# Patient Record
Sex: Male | Born: 1960 | Race: Black or African American | Hispanic: No | Marital: Single | Smoking: Current every day smoker
Health system: Southern US, Community
[De-identification: ages and names within clinical notes are randomized; demographics above are authoritative.]

## PROBLEM LIST (undated history)

## (undated) DIAGNOSIS — I10 Essential (primary) hypertension: Secondary | ICD-10-CM

## (undated) DIAGNOSIS — G4700 Insomnia, unspecified: Secondary | ICD-10-CM

## (undated) DIAGNOSIS — F209 Schizophrenia, unspecified: Secondary | ICD-10-CM

## (undated) HISTORY — PX: KNEE SURGERY: SHX244

## (undated) HISTORY — DX: Insomnia, unspecified: G47.00

---

## 2008-07-16 ENCOUNTER — Encounter: Admission: RE | Admit: 2008-07-16 | Discharge: 2008-07-16 | Payer: Self-pay | Admitting: Orthopedic Surgery

## 2008-11-03 ENCOUNTER — Encounter: Admission: RE | Admit: 2008-11-03 | Discharge: 2008-11-16 | Payer: Self-pay | Admitting: Orthopedic Surgery

## 2009-02-11 ENCOUNTER — Encounter: Admission: RE | Admit: 2009-02-11 | Discharge: 2009-02-11 | Payer: Self-pay | Admitting: Orthopedic Surgery

## 2009-03-02 ENCOUNTER — Encounter: Admission: RE | Admit: 2009-03-02 | Discharge: 2009-05-31 | Payer: Self-pay | Admitting: Orthopedic Surgery

## 2010-07-12 ENCOUNTER — Emergency Department (HOSPITAL_COMMUNITY)
Admission: EM | Admit: 2010-07-12 | Discharge: 2010-07-12 | Disposition: A | Discharge status: Home / Self Care | Payer: Medicaid Other | Attending: Emergency Medicine | Admitting: Emergency Medicine

## 2010-07-12 DIAGNOSIS — M25569 Pain in unspecified knee: Secondary | ICD-10-CM | POA: Insufficient documentation

## 2010-07-12 DIAGNOSIS — Z9889 Other specified postprocedural states: Secondary | ICD-10-CM | POA: Insufficient documentation

## 2010-07-22 ENCOUNTER — Emergency Department (HOSPITAL_COMMUNITY): Payer: Medicaid Other

## 2010-07-22 ENCOUNTER — Emergency Department (HOSPITAL_COMMUNITY)
Admission: EM | Admit: 2010-07-22 | Discharge: 2010-07-22 | Disposition: A | Discharge status: Home / Self Care | Payer: Medicaid Other | Attending: Emergency Medicine | Admitting: Emergency Medicine

## 2010-07-22 DIAGNOSIS — M545 Low back pain, unspecified: Secondary | ICD-10-CM | POA: Insufficient documentation

## 2010-07-22 DIAGNOSIS — M549 Dorsalgia, unspecified: Secondary | ICD-10-CM | POA: Insufficient documentation

## 2010-07-26 ENCOUNTER — Emergency Department (HOSPITAL_COMMUNITY)
Admission: EM | Admit: 2010-07-26 | Discharge: 2010-07-26 | Disposition: A | Discharge status: Home / Self Care | Payer: Medicaid Other | Attending: Emergency Medicine | Admitting: Emergency Medicine

## 2010-07-26 DIAGNOSIS — M549 Dorsalgia, unspecified: Secondary | ICD-10-CM | POA: Insufficient documentation

## 2010-09-03 ENCOUNTER — Emergency Department (HOSPITAL_COMMUNITY)
Admission: EM | Admit: 2010-09-03 | Discharge: 2010-09-03 | Disposition: A | Discharge status: Home / Self Care | Payer: Medicaid Other | Attending: Emergency Medicine | Admitting: Emergency Medicine

## 2010-09-03 DIAGNOSIS — M25569 Pain in unspecified knee: Secondary | ICD-10-CM | POA: Insufficient documentation

## 2010-09-03 DIAGNOSIS — G8929 Other chronic pain: Secondary | ICD-10-CM | POA: Insufficient documentation

## 2010-09-12 ENCOUNTER — Inpatient Hospital Stay (INDEPENDENT_AMBULATORY_CARE_PROVIDER_SITE_OTHER)
Admission: RE | Admit: 2010-09-12 | Discharge: 2010-09-12 | Disposition: A | Discharge status: Home / Self Care | Payer: No Typology Code available for payment source | Source: Ambulatory Visit | Attending: Emergency Medicine | Admitting: Emergency Medicine

## 2010-09-12 DIAGNOSIS — R6889 Other general symptoms and signs: Secondary | ICD-10-CM

## 2010-09-12 DIAGNOSIS — M79609 Pain in unspecified limb: Secondary | ICD-10-CM

## 2012-05-14 ENCOUNTER — Encounter: Payer: Medicaid Other | Admitting: Internal Medicine

## 2014-07-26 ENCOUNTER — Encounter (HOSPITAL_COMMUNITY): Payer: Self-pay | Admitting: Emergency Medicine

## 2014-07-26 ENCOUNTER — Emergency Department (HOSPITAL_COMMUNITY)
Admission: EM | Admit: 2014-07-26 | Discharge: 2014-07-26 | Disposition: A | Discharge status: Home / Self Care | Payer: PRIVATE HEALTH INSURANCE | Attending: Emergency Medicine | Admitting: Emergency Medicine

## 2014-07-26 DIAGNOSIS — Z720 Tobacco use: Secondary | ICD-10-CM | POA: Diagnosis not present

## 2014-07-26 DIAGNOSIS — L02811 Cutaneous abscess of head [any part, except face]: Secondary | ICD-10-CM | POA: Insufficient documentation

## 2014-07-26 DIAGNOSIS — M542 Cervicalgia: Secondary | ICD-10-CM | POA: Diagnosis present

## 2014-07-26 MED ORDER — CEPHALEXIN 500 MG PO CAPS
500.0000 mg | ORAL_CAPSULE | Freq: Once | ORAL | Status: AC
  Administered 2014-07-26: 500 mg via ORAL
  Filled 2014-07-26: qty 1

## 2014-07-26 MED ORDER — LIDOCAINE-EPINEPHRINE 2 %-1:100000 IJ SOLN
INTRAMUSCULAR | Status: AC
  Administered 2014-07-26: 20 mL
  Filled 2014-07-26: qty 1

## 2014-07-26 MED ORDER — SULFAMETHOXAZOLE-TRIMETHOPRIM 800-160 MG PO TABS: 1.0000 | ORAL_TABLET | Freq: Two times a day (BID) | ORAL | Status: DC

## 2014-07-26 MED ORDER — OXYCODONE-ACETAMINOPHEN 5-325 MG PO TABS
2.0000 | ORAL_TABLET | Freq: Once | ORAL | Status: AC
  Administered 2014-07-26: 2 via ORAL
  Filled 2014-07-26: qty 2

## 2014-07-26 MED ORDER — CEPHALEXIN 500 MG PO CAPS: 500.0000 mg | ORAL_CAPSULE | Freq: Four times a day (QID) | ORAL | Status: DC

## 2014-07-26 MED ORDER — OXYCODONE-ACETAMINOPHEN 5-325 MG PO TABS: 2.0000 | ORAL_TABLET | ORAL | Status: DC | PRN

## 2014-07-26 MED ORDER — SULFAMETHOXAZOLE-TRIMETHOPRIM 800-160 MG PO TABS
1.0000 | ORAL_TABLET | Freq: Once | ORAL | Status: AC
  Administered 2014-07-26: 1 via ORAL
  Filled 2014-07-26: qty 1

## 2014-07-26 MED ORDER — LIDOCAINE-EPINEPHRINE (PF) 2 %-1:200000 IJ SOLN: 10.0000 mL | Freq: Once | INTRAMUSCULAR | Status: DC

## 2014-07-26 NOTE — ED Notes (Signed)
Pt c/o abscess to back of head x 2 wks after getting his hair cut at a barber shop. Went two weeks ago and had his hair cut and developed an abscess. Pt states it got better and he went back and got his hair cut again and developed a larger abscess.

## 2014-07-26 NOTE — ED Provider Notes (Signed)
CSN: [id number]     Arrival date & time 07/26/14  1215 History  This chart was scribed for non-physician practitioner Catha Gosselin, PA-C working with Samuel Jester, DO by Murriel Hopper, ED Scribe. This patient was seen in room WTR5/WTR5 and the patient's care was started at 12:33 PM.    Chief Complaint  Patient presents with  . Abscess      The history is provided by the patient. No language interpreter was used.    HPI Comments: Edwin Henry is a 54 y.o. male who presents to the Emergency Department complaining of a worsening abscess on the back side of his neck that has been present for 9-10 days that began after pt went to the barber shop. Pt states he has not been able to sleep and has taken Goody's PM for pain with little relief. Pt also reports having subjective fever and chills the past couple of days. Pt denies being a diabetic, denies a history of abscesses before.     History reviewed. No pertinent past medical history. History reviewed. No pertinent past surgical history. History reviewed. No pertinent family history. History  Substance Use Topics  . Smoking status: Current Every Day Smoker -- 0.50 packs/day  . Smokeless tobacco: Not on file  . Alcohol Use: No    Review of Systems  Constitutional: Positive for fever and chills.  Gastrointestinal: Negative for nausea and vomiting.  Musculoskeletal: Positive for neck pain.  Skin: Positive for wound.      Allergies  Review of patient's allergies indicates no known allergies.  Home Medications   Prior to Admission medications   Medication Sig Start Date End Date Taking? Authorizing Provider  cephALEXin (KEFLEX) 500 MG capsule Take 1 capsule (500 mg total) by mouth 4 (four) times daily. 07/26/14   Cambre Matson Patel-Mills, PA-C  oxyCODONE-acetaminophen (PERCOCET/ROXICET) 5-325 MG per tablet Take 2 tablets by mouth every 4 (four) hours as needed for severe pain. 07/26/14   Johathon Overturf Patel-Mills, PA-C   sulfamethoxazole-trimethoprim (BACTRIM DS,SEPTRA DS) 800-160 MG per tablet Take 1 tablet by mouth 2 (two) times daily. 07/26/14   Wyoma Genson Patel-Mills, PA-C   BP 120/84 mmHg  Pulse 96  Temp(Src) 97.9 F (36.6 C) (Oral)  Resp 18  SpO2 100% Physical Exam  Constitutional: He is oriented to person, place, and time. He appears well-developed and well-nourished.  HENT:  Head: Normocephalic and atraumatic.  Eyes: Conjunctivae are normal.  Neck:    Cardiovascular: Normal rate.   Pulmonary/Chest: Effort normal.  Musculoskeletal: Normal range of motion.  Neurological: He is alert and oriented to person, place, and time.  Skin: Skin is warm and dry.  Fluctuant abscess over occipital lobe that has come to a head. No drainage. No erythema or surrounding cellulitis. No anterior cervical lymphadenopathy.   Psychiatric: He has a normal mood and affect.  Nursing note and vitals reviewed.   ED Course  INCISION AND DRAINAGE Date/Time: 07/27/2014 10:56 AM Performed by: Catha Gosselin Authorized by: Catha Gosselin Consent: Verbal consent obtained. Written consent obtained. Risks and benefits: risks, benefits and alternatives were discussed Consent given by: patient Patient understanding: patient states understanding of the procedure being performed Patient consent: the patient's understanding of the procedure matches consent given Patient identity confirmed: verbally with patient Type: abscess Body area: head/neck Location details: neck Anesthesia: local infiltration Local anesthetic: lidocaine 2% with epinephrine Anesthetic total: 2 ml Patient sedated: no Scalpel size: 11 Needle gauge: 25. Incision type: single straight Complexity: simple Drainage: purulent and  serosanguinous Drainage amount:  moderate Wound treatment: wound left open Packing material: none Patient tolerance: Patient tolerated the procedure well with no immediate complications Comments: Patient felt nauseated  during the procedure but did not vomit.  He stated that he had to use the bathroom.  He was ambulatory and did not feel like passing out. Once he returned, I continued to drain the wound.    (including critical care time)  DIAGNOSTIC STUDIES: Oxygen Saturation is 100% on room air, normal by my interpretation.    COORDINATION OF CARE: 12:36 PM Discussed treatment plan with pt at bedside and pt agreed to plan.   Labs Review Labs Reviewed  CULTURE, ROUTINE-ABSCESS  WOUND CULTURE    Imaging Review No results found.   EKG Interpretation None      MDM   Final diagnoses:  Abscess, scalp   Patient presents for posterior scalp and neck abscess. I was able to drain a large amount of fluid from the site. The wound was not packed since it was on the scalp but it was left open. Patient is well appearing and not febrile. I put the patient on keflex and bactrim and discussed strict return precautions.  I also requested that he come back for recheck in 24 - 48 hours due to location and size of abscess. I also discussed warm compresses. He should return for fever or increase in size, no improvement. Patient verbally agrees with plan.  I personally performed the services described in this documentation, which was scribed in my presence. The recorded information has been reviewed and is accurate.    Catha Gosselin, PA-C 07/27/14 1102  Samuel Jester, [place] 07/28/14 [phone]

## 2014-07-26 NOTE — Discharge Instructions (Signed)
Abscess Please return in 2 days for wound recheck. Take anabiotic's as prescribed. An abscess (boil or furuncle) is an infected area on or under the skin. This area is filled with yellowish-white fluid (pus) and other material (debris). HOME CARE   Only take medicines as told by your doctor.  If you were given antibiotic medicine, take it as directed. Finish the medicine even if you start to feel better.  If gauze is used, follow your doctor's directions for changing the gauze.  To avoid spreading the infection:  Keep your abscess covered with a bandage.  Wash your hands well.  Do not share personal care items, towels, or whirlpools with others.  Avoid skin contact with others.  Keep your skin and clothes clean around the abscess.  Keep all doctor visits as told. GET HELP RIGHT AWAY IF:   You have more pain, puffiness (swelling), or redness in the wound site.  You have more fluid or blood coming from the wound site.  You have muscle aches, chills, or you feel sick.  You have a fever. MAKE SURE YOU:   Understand these instructions.  Will watch your condition.  Will get help right away if you are not doing well or get worse. Document Released: 07/12/2007 Document Revised: 07/25/2011 Document Reviewed: 04/07/2011 [facility] Patient Information 2015 [place], [place]. This information is not intended to replace advice given to you by your health care provider. Make sure you discuss any questions you have with your health care provider.

## 2014-07-28 ENCOUNTER — Encounter (HOSPITAL_COMMUNITY): Payer: Self-pay | Admitting: Emergency Medicine

## 2014-07-28 ENCOUNTER — Emergency Department (HOSPITAL_COMMUNITY)
Admission: EM | Admit: 2014-07-28 | Discharge: 2014-07-28 | Disposition: A | Discharge status: Home / Self Care | Payer: Medicaid - Out of State | Attending: Emergency Medicine | Admitting: Emergency Medicine

## 2014-07-28 DIAGNOSIS — Z720 Tobacco use: Secondary | ICD-10-CM | POA: Insufficient documentation

## 2014-07-28 DIAGNOSIS — L02811 Cutaneous abscess of head [any part, except face]: Secondary | ICD-10-CM

## 2014-07-28 DIAGNOSIS — Z7982 Long term (current) use of aspirin: Secondary | ICD-10-CM | POA: Insufficient documentation

## 2014-07-28 DIAGNOSIS — Z48817 Encounter for surgical aftercare following surgery on the skin and subcutaneous tissue: Secondary | ICD-10-CM | POA: Diagnosis present

## 2014-07-28 MED ORDER — OXYCODONE-ACETAMINOPHEN 5-325 MG PO TABS: 1.0000 | ORAL_TABLET | ORAL | Status: DC | PRN

## 2014-07-28 MED ORDER — SULFAMETHOXAZOLE-TRIMETHOPRIM 800-160 MG PO TABS
1.0000 | ORAL_TABLET | Freq: Two times a day (BID) | ORAL | Status: AC
Start: 2014-07-28 — End: 2014-08-04

## 2014-07-28 MED ORDER — OXYCODONE-ACETAMINOPHEN 5-325 MG PO TABS
1.0000 | ORAL_TABLET | Freq: Once | ORAL | Status: AC
  Administered 2014-07-28: 1 via ORAL
  Filled 2014-07-28: qty 1

## 2014-07-28 MED ORDER — CEPHALEXIN 500 MG PO CAPS: 500.0000 mg | ORAL_CAPSULE | Freq: Four times a day (QID) | ORAL | Status: DC

## 2014-07-28 NOTE — ED Provider Notes (Signed)
CSN: [id number]     Arrival date & time 07/28/14  1622 History  This chart was scribed for non-physician practitioner, Jaynie Crumble, PA-C working with Richardean Canal, MD by Placido Sou, ED scribe. This patient was seen in room WTR7/WTR7 and the patient's care was started at 5:00 PM.    Chief Complaint  Patient presents with  . Wound Check   The history is provided by the patient. No language interpreter was used.    HPI Comments: Edwin Henry is a 54 y.o. male who presents to the Emergency Department complaining of a wound on the back of his neck s/p an I&D that was performed at [facility] 2 days ago. Pt notes constant, moderate, pain and drainage to the affected area as associated symptoms. He further notes intermitted changes in body temperature 2 days ago but is unsure if he was febrile. Pt notes taking his prescriptions as required s/p the procedure and denies taking any other medications. Pt denies any other associated symptoms.    History reviewed. No pertinent past medical history. History reviewed. No pertinent past surgical history. History reviewed. No pertinent family history. History  Substance Use Topics  . Smoking status: Current Every Day Smoker -- 0.50 packs/day  . Smokeless tobacco: Not on file  . Alcohol Use: No    Review of Systems  Skin: Positive for wound. Negative for color change and pallor.      Allergies  Review of patient's allergies indicates no known allergies.  Home Medications   Prior to Admission medications   Medication Sig Start Date End Date Taking? Authorizing Provider  cephALEXin (KEFLEX) 500 MG capsule Take 1 capsule (500 mg total) by mouth 4 (four) times daily. 07/26/14   Hanna Patel-Mills, PA-C  oxyCODONE-acetaminophen (PERCOCET/ROXICET) 5-325 MG per tablet Take 2 tablets by mouth every 4 (four) hours as needed for severe pain. 07/26/14   Hanna Patel-Mills, PA-C  sulfamethoxazole-trimethoprim (BACTRIM DS,SEPTRA DS) 800-160 MG per  tablet Take 1 tablet by mouth 2 (two) times daily. 07/26/14   Hanna Patel-Mills, PA-C   BP 156/84 mmHg  Pulse 91  Temp(Src) 98.1 F (36.7 C) (Oral)  Resp 16  SpO2 98% Physical Exam  Constitutional: He is oriented to person, place, and time. He appears well-developed and well-nourished. No distress.  HENT:  Head: Normocephalic and atraumatic.  Mouth/Throat: Oropharynx is clear and moist.  Eyes: Conjunctivae and EOM are normal. Pupils are equal, round, and reactive to light.  Neck: Normal range of motion. Neck supple. No tracheal deviation present.  Cardiovascular: Normal rate.   Pulmonary/Chest: Breath sounds normal. No respiratory distress.  Neurological: He is alert and oriented to person, place, and time.  Skin: Skin is warm and dry.  Area of induration measuring approximately 6 cm diameter, with central wound from prior incision, draining copious purulent drainage. Area is tender to palpation. No surrounding erythema.  Psychiatric: He has a normal mood and affect. His behavior is normal.  Nursing note and vitals reviewed.   ED Course  Procedures  DIAGNOSTIC STUDIES: Oxygen Saturation is 98% on RA, normal by my interpretation.    COORDINATION OF CARE: 5:09 PM Discussed treatment plan with pt at bedside including recommendations for warm compresses and continuation of his prescriptions and pt agreed to plan.  Labs Review Labs Reviewed - No data to display  Imaging Review No results found.   EKG Interpretation None      MDM   Final diagnoses:  Abscess of scalp     patient  with posterior scalp abscess, incised and drained 2 days ago, here for recheck. Wound is draining copious amounts of purulent drainage. I expressed as much drainage as I could, I irrigated it with saline. There is no surrounding cellulitis, vital signs are normal except for mild hypertension, he is afebrile, no other complaints. Will give enough antibiotics for 10 day course, patient received 5 day  course 2 days ago. We will give 10 tablets of Percocet for pain. Follow-up with her regular doctor as needed. Return precautions discussed.  Filed Vitals:   07/28/14 1629  BP: 156/84  Pulse: 91  Temp: 98.1 F (36.7 C)  TempSrc: Oral  Resp: 16  SpO2: 98%    I personally performed the services described in this documentation, which was scribed in my presence. The recorded information has been reviewed and is accurate.   Jaynie Crumble, PA-C 07/28/14 1719  Richardean Canal, MD 07/28/14 2104

## 2014-07-28 NOTE — Progress Notes (Signed)
CM spoke with pt who confirms self pay [facility] resident with no pcp.  CM discussed and provided written information for self pay pcps, discussed the importance of pcp vs EDP services for f/u care, www.needymeds.org, www.goodrx.com, discounted pharmacies and other [employer] such as [employer] , [employer], affordable care act,  [place] med assist, financial assistance, self pay dental services, [place] med assist, DSS and  health department  Reviewed resources for [employer] self pay pcps like [name], family medicine at [facility], [facility], [facility], local urgent care centers, [facility], [facility] family practice, general medical clinics, family services of the [place], [facility] urgent care plus others, medication resources, [facility] out patient pharmacies and housing Pt voiced understanding and appreciation of resources provided   Provided [facility] contact information Pt does not agreed to a referral He states he will re apply for [employer] he left [place] with medicaid Did not have to apply in [place] only given a letter to get medicaid in [place]. When leaving [place] he was given a letter for "thirty days" but has been told he needs to re apply for medicaid in [place] since returning to [facility]

## 2014-07-28 NOTE — ED Notes (Signed)
Pt states he was seen here for an abcess on the back of his neck as a result of getting his hair cut and has come back for a f/u after having the area lanced here on Sunday. Alert and oriented.

## 2014-07-28 NOTE — Discharge Instructions (Signed)
Continue antibiotics. Wound soaks or warm compresses several times a day. Pain medication as needed. Follow up with primary care doctor as needed. Return if worsening.    Abscess Care After An abscess (also called a boil or furuncle) is an infected area that contains a collection of pus. Signs and symptoms of an abscess include pain, tenderness, redness, or hardness, or you may feel a moveable soft area under your skin. An abscess can occur anywhere in the body. The infection may spread to surrounding tissues causing cellulitis. A cut (incision) by the surgeon was made over your abscess and the pus was drained out. Gauze may have been packed into the space to provide a drain that will allow the cavity to heal from the inside outwards. The boil may be painful for 5 to 7 days. Most people with a boil do not have high fevers. Your abscess, if seen early, may not have localized, and may not have been lanced. If not, another appointment may be required for this if it does not get better on its own or with medications. HOME CARE INSTRUCTIONS   Only take over-the-counter or prescription medicines for pain, discomfort, or fever as directed by your caregiver.  When you bathe, soak and then remove gauze or iodoform packs at least daily or as directed by your caregiver. You may then wash the wound gently with mild soapy water. Repack with gauze or do as your caregiver directs. SEEK IMMEDIATE MEDICAL CARE IF:   You develop increased pain, swelling, redness, drainage, or bleeding in the wound site.  You develop signs of generalized infection including muscle aches, chills, fever, or a general ill feeling.  An oral temperature above 102 F (38.9 C) develops, not controlled by medication. See your caregiver for a recheck if you develop any of the symptoms described above. If medications (antibiotics) were prescribed, take them as directed. Document Released: 08/11/2004 Document Revised: 04/17/2011 Document  Reviewed: 04/08/2007 [facility] Patient Information 2015 [place], [place]. This information is not intended to replace advice given to you by your health care provider. Make sure you discuss any questions you have with your health care provider.

## 2014-07-29 ENCOUNTER — Telehealth (HOSPITAL_BASED_OUTPATIENT_CLINIC_OR_DEPARTMENT_OTHER): Payer: Self-pay | Admitting: Emergency Medicine

## 2014-07-29 LAB — CULTURE, ROUTINE-ABSCESS: Gram Stain: NONE SEEN

## 2016-03-27 ENCOUNTER — Emergency Department (HOSPITAL_COMMUNITY): Payer: Medicaid Other

## 2016-03-27 ENCOUNTER — Encounter (HOSPITAL_COMMUNITY): Payer: Self-pay | Admitting: Emergency Medicine

## 2016-03-27 ENCOUNTER — Emergency Department (HOSPITAL_COMMUNITY)
Admission: EM | Admit: 2016-03-27 | Discharge: 2016-03-27 | Disposition: A | Discharge status: Home / Self Care | Payer: Medicaid Other | Attending: Emergency Medicine | Admitting: Emergency Medicine

## 2016-03-27 DIAGNOSIS — Y999 Unspecified external cause status: Secondary | ICD-10-CM | POA: Insufficient documentation

## 2016-03-27 DIAGNOSIS — Y939 Activity, unspecified: Secondary | ICD-10-CM | POA: Diagnosis not present

## 2016-03-27 DIAGNOSIS — Y929 Unspecified place or not applicable: Secondary | ICD-10-CM | POA: Insufficient documentation

## 2016-03-27 DIAGNOSIS — M25521 Pain in right elbow: Secondary | ICD-10-CM | POA: Diagnosis not present

## 2016-03-27 DIAGNOSIS — W2203XA Walked into furniture, initial encounter: Secondary | ICD-10-CM | POA: Diagnosis not present

## 2016-03-27 DIAGNOSIS — Z79899 Other long term (current) drug therapy: Secondary | ICD-10-CM | POA: Insufficient documentation

## 2016-03-27 MED ORDER — TRAMADOL HCL 50 MG PO TABS: 50.0000 mg | ORAL_TABLET | Freq: Four times a day (QID) | ORAL | 0 refills | Status: DC | PRN

## 2016-03-27 MED ORDER — TRAMADOL HCL 50 MG PO TABS
50.0000 mg | ORAL_TABLET | Freq: Once | ORAL | Status: AC
  Administered 2016-03-27: 50 mg via ORAL
  Filled 2016-03-27: qty 1

## 2016-03-27 NOTE — Discharge Instructions (Signed)
Please read attached information. If you experience any new or worsening signs or symptoms please return to the emergency room for evaluation. Please follow-up with your primary care provider or specialist as discussed. Please use medication prescribed only as directed and discontinue taking if you have any concerning signs or symptoms.   °

## 2016-03-27 NOTE — ED Notes (Signed)
Pt is here for right elbow pain and swelling, swelling in the affected hand also.  Pain increased with bending.  Applied ice pack.  Pt struck his elbow on doorframe 2 days ago.  Pain has been severe causing it difficult for him to get any sleep

## 2016-03-27 NOTE — ED Notes (Signed)
Pt continues to have severe pain.  ICe pack was given earlier, pt requesting pain med.  Will notify PA

## 2016-03-27 NOTE — ED Notes (Signed)
Patient was alert, oriented and stable upon discharge. RN went over AVS and patient had no further questions.  Patient was instructed not to drive or operate heavy machinery on narcotic pain medication.   

## 2016-03-27 NOTE — ED Triage Notes (Signed)
Per pt, states he injured right elbow-hit it on door-increased pain and swelling

## 2016-03-27 NOTE — ED Provider Notes (Signed)
[facility]-EMERGENCY DEPT Provider Note   CSN: [id number] Arrival date & time: 03/27/16  1159  By signing my name below, I, Sonum Patel, attest that this documentation has been prepared under the direction and in the presence of Newell RubbermaidJeffrey Undray Allman, PA-C. Electronically Signed: Sonum Patel, Neurosurgeoncribe. 03/27/16. 3:06 PM.  History   Chief Complaint Chief Complaint  Patient presents with  . Elbow Pain    The history is provided by the patient. No language interpreter was used.     HPI Comments:    Edwin Henry is a 56 y.o. male who presents to the Emergency Department complaining of persistent, unchanged right elbow pain that began after an injury 2 days ago. He states he was playing with his daughter when his right elbow struck a doorframe. He states the pain started gradually and now he has associated swelling to the RUE due to hanging his arm at his side. He attempted to elevate his arm on a cushion last night but states this was too painful. He has tried ibuprofen and Goody PM without relief. He denies numbness, weakness, fever.    History reviewed. No pertinent past medical history.  There are no active problems to display for this patient.   History reviewed. No pertinent surgical history.    Home Medications    Prior to Admission medications   Medication Sig Start Date End Date Taking? Authorizing Provider  cephALEXin (KEFLEX) 500 MG capsule Take 1 capsule (500 mg total) by mouth 4 (four) times daily. 07/26/14   Hanna Patel-Mills, PA-C  cephALEXin (KEFLEX) 500 MG capsule Take 1 capsule (500 mg total) by mouth 4 (four) times daily. 07/28/14   Tatyana Kirichenko, PA-C  oxyCODONE-acetaminophen (PERCOCET) 5-325 MG per tablet Take 1 tablet by mouth every 4 (four) hours as needed for severe pain. 07/28/14   Tatyana Kirichenko, PA-C  oxyCODONE-acetaminophen (PERCOCET/ROXICET) 5-325 MG per tablet Take 2 tablets by mouth every 4 (four) hours as needed for severe pain. 07/26/14   Hanna Patel-Mills,  PA-C  sulfamethoxazole-trimethoprim (BACTRIM DS,SEPTRA DS) 800-160 MG per tablet Take 1 tablet by mouth 2 (two) times daily. 07/26/14   Hanna Patel-Mills, PA-C  traMADol (ULTRAM) 50 MG tablet Take 1 tablet (50 mg total) by mouth every 6 (six) hours as needed. 03/27/16   Eyvonne MechanicJeffrey Jaishawn Witzke, PA-C    Family History No family history on file.  Social History Social History  Substance Use Topics  . Smoking status: Current Every Day Smoker    Packs/day: 0.50  . Smokeless tobacco: Not on file  . Alcohol use No     Allergies   Patient has no known allergies.   Review of Systems Review of Systems  Constitutional: Negative for fever.  Musculoskeletal: Positive for arthralgias and joint swelling.  Neurological: Negative for weakness and numbness.     Physical Exam Updated Vital Signs BP 138/91 (BP Location: Left Arm)   Pulse 94   Temp 98.8 F (37.1 C) (Oral)   Resp 12   Wt 85.3 kg   SpO2 100%   Physical Exam  Constitutional: He is oriented to person, place, and time. He appears well-developed and well-nourished.  HENT:  Head: Normocephalic and atraumatic.  Cardiovascular: Normal rate.   Pulmonary/Chest: Effort normal.  Musculoskeletal:  Minor soft tissue swelling to the lateral aspect of the right elbow. Patient has difficulty with full flexion of the elbow, tenderness to palpation of the lateral aspect. No midline tenderness, no obvious joint swelling or erythema. Distal sensation intact, strength intact.  Neurological: He is  alert and oriented to person, place, and time.  Skin: Skin is warm and dry.  Psychiatric: He has a normal mood and affect.  Nursing note and vitals reviewed.    ED Treatments / Results  DIAGNOSTIC STUDIES: Oxygen Saturation is 100% on RA, normal by my interpretation.    COORDINATION OF CARE: 3:06 PM Discussed treatment plan with pt at bedside and pt agreed to plan.   Labs (all labs ordered are listed, but only abnormal results are displayed) Labs  Reviewed - No data to display  EKG  EKG Interpretation None       Radiology Dg Elbow Complete Right  Result Date: 03/27/2016 CLINICAL DATA:  Injured right elbow up on the were 2 days ago. Posterior pain since then. Initial encounter. EXAM: RIGHT ELBOW - COMPLETE 3+ VIEW COMPARISON:  None. FINDINGS: There is no evidence of fracture, dislocation, or joint effusion. Incidental supracondylar and olecranon enthesophytes. Density in the proximal ventral forearm appears calcific. No suspected foreign body. IMPRESSION: No acute finding. Electronically Signed   By: Marnee Spring M.D.   On: 03/27/2016 12:48    Procedures Procedures (including critical care time)  Medications Ordered in ED Medications  traMADol (ULTRAM) tablet 50 mg (50 mg Oral Given 03/27/16 1517)     Initial Impression / Assessment and Plan / ED Course  I have reviewed the triage vital signs and the nursing notes.  Pertinent labs & imaging results that were available during my care of the patient were reviewed by me and considered in my medical decision making (see chart for details).       Final Clinical Impressions(s) / ED Diagnoses   Final diagnoses:  Right elbow pain    Labs:   Imaging:   Consults:  Therapeutics:  Discharge Meds:   Assessment/Plan: 56 year old male presents today with pain to his right elbow. Patient has some soft tissue swelling, no signs of infectious etiology, no acute fractures on his x-ray. Patient will be encouraged to continue to ice, rest, elevate, use medication as needed for pain. Follow-up with orthopedics if symptoms persist, return as needed. He verbalized understanding and agreement to today's plan had no further questions or concerns at time of discharge.      New Prescriptions New Prescriptions   TRAMADOL (ULTRAM) 50 MG TABLET    Take 1 tablet (50 mg total) by mouth every 6 (six) hours as needed.   I personally performed the services described in this  documentation, which was scribed in my presence. The recorded information has been reviewed and is accurate.   Eyvonne Mechanic, PA-C 03/27/16 1542    Vanetta Mulders, MD 03/28/16 [phone]

## 2017-10-09 ENCOUNTER — Encounter (HOSPITAL_COMMUNITY): Payer: Self-pay | Admitting: Emergency Medicine

## 2017-10-09 ENCOUNTER — Emergency Department (HOSPITAL_COMMUNITY)
Admission: EM | Admit: 2017-10-09 | Discharge: 2017-10-10 | Disposition: A | Discharge status: Other Acute Inpatient Hospital | Payer: Medicaid Other | Attending: Emergency Medicine | Admitting: Emergency Medicine

## 2017-10-09 ENCOUNTER — Other Ambulatory Visit: Payer: Self-pay

## 2017-10-09 DIAGNOSIS — F17210 Nicotine dependence, cigarettes, uncomplicated: Secondary | ICD-10-CM | POA: Insufficient documentation

## 2017-10-09 DIAGNOSIS — R4589 Other symptoms and signs involving emotional state: Secondary | ICD-10-CM

## 2017-10-09 DIAGNOSIS — F29 Unspecified psychosis not due to a substance or known physiological condition: Secondary | ICD-10-CM

## 2017-10-09 DIAGNOSIS — F333 Major depressive disorder, recurrent, severe with psychotic symptoms: Secondary | ICD-10-CM | POA: Insufficient documentation

## 2017-10-09 DIAGNOSIS — R4689 Other symptoms and signs involving appearance and behavior: Secondary | ICD-10-CM

## 2017-10-09 DIAGNOSIS — R45851 Suicidal ideations: Secondary | ICD-10-CM | POA: Insufficient documentation

## 2017-10-09 MED ORDER — ACETAMINOPHEN 325 MG PO TABS
650.0000 mg | ORAL_TABLET | Freq: Once | ORAL | Status: AC
  Administered 2017-10-10: 650 mg via ORAL
  Filled 2017-10-09: qty 2

## 2017-10-09 NOTE — ED Triage Notes (Signed)
Patient states he is hearing voices. [employer] dropped patient off and left him in the waiting room. Patient states the voices are telling him to hurt himself.

## 2017-10-09 NOTE — ED Notes (Signed)
Bed: [facility] Expected date:  Expected time:  Means of arrival:  Comments: 

## 2017-10-10 ENCOUNTER — Encounter (HOSPITAL_COMMUNITY): Payer: Self-pay | Admitting: *Deleted

## 2017-10-10 ENCOUNTER — Encounter (HOSPITAL_COMMUNITY): Payer: Self-pay | Admitting: Behavioral Health

## 2017-10-10 ENCOUNTER — Inpatient Hospital Stay (HOSPITAL_COMMUNITY)
Admission: AD | Admit: 2017-10-10 | Discharge: 2017-10-19 | DRG: 882 | Disposition: A | Discharge status: Home / Self Care | Payer: Medicaid Other | Source: Intra-hospital | Attending: Psychiatry | Admitting: Psychiatry

## 2017-10-10 DIAGNOSIS — Z62810 Personal history of physical and sexual abuse in childhood: Secondary | ICD-10-CM | POA: Diagnosis present

## 2017-10-10 DIAGNOSIS — G4700 Insomnia, unspecified: Secondary | ICD-10-CM | POA: Diagnosis present

## 2017-10-10 DIAGNOSIS — Z79899 Other long term (current) drug therapy: Secondary | ICD-10-CM | POA: Diagnosis not present

## 2017-10-10 DIAGNOSIS — F209 Schizophrenia, unspecified: Secondary | ICD-10-CM | POA: Diagnosis present

## 2017-10-10 DIAGNOSIS — F200 Paranoid schizophrenia: Secondary | ICD-10-CM | POA: Diagnosis not present

## 2017-10-10 DIAGNOSIS — F515 Nightmare disorder: Secondary | ICD-10-CM | POA: Diagnosis present

## 2017-10-10 DIAGNOSIS — G259 Extrapyramidal and movement disorder, unspecified: Secondary | ICD-10-CM | POA: Diagnosis present

## 2017-10-10 DIAGNOSIS — Z811 Family history of alcohol abuse and dependence: Secondary | ICD-10-CM | POA: Diagnosis not present

## 2017-10-10 DIAGNOSIS — F17210 Nicotine dependence, cigarettes, uncomplicated: Secondary | ICD-10-CM | POA: Diagnosis present

## 2017-10-10 DIAGNOSIS — F4310 Post-traumatic stress disorder, unspecified: Principal | ICD-10-CM | POA: Diagnosis present

## 2017-10-10 DIAGNOSIS — F329 Major depressive disorder, single episode, unspecified: Secondary | ICD-10-CM | POA: Diagnosis not present

## 2017-10-10 DIAGNOSIS — F419 Anxiety disorder, unspecified: Secondary | ICD-10-CM | POA: Diagnosis present

## 2017-10-10 DIAGNOSIS — Z560 Unemployment, unspecified: Secondary | ICD-10-CM

## 2017-10-10 DIAGNOSIS — R45851 Suicidal ideations: Secondary | ICD-10-CM | POA: Diagnosis present

## 2017-10-10 LAB — COMPREHENSIVE METABOLIC PANEL
ALT: 28 U/L (ref 0–44)
AST: 25 U/L (ref 15–41)
Albumin: 4.4 g/dL (ref 3.5–5.0)
Alkaline Phosphatase: 79 U/L (ref 38–126)
Anion gap: 13 (ref 5–15)
BUN: 9 mg/dL (ref 6–20)
CALCIUM: 10 mg/dL (ref 8.9–10.3)
CHLORIDE: 104 mmol/L (ref 98–111)
CO2: 27 mmol/L (ref 22–32)
Creatinine, Ser: 0.88 mg/dL (ref 0.61–1.24)
GFR calc non Af Amer: >60 mL/min (ref >60)
Glucose, Bld: 96 mg/dL (ref 70–99)
Potassium: 3.5 mmol/L (ref 3.5–5.1)
SODIUM: 144 mmol/L (ref 135–145)
Total Bilirubin: 0.4 mg/dL (ref 0.3–1.2)
Total Protein: 7.9 g/dL (ref 6.5–8.1)

## 2017-10-10 LAB — CBC
HEMATOCRIT: 45.3 % (ref 39.0–52.0)
HEMOGLOBIN: 15.7 g/dL (ref 13.0–17.0)
MCH: 32.9 pg (ref 26.0–34.0)
MCHC: 34.7 g/dL (ref 30.0–36.0)
MCV: 95 fL (ref 78.0–100.0)
Platelets: 307 10*3/uL (ref 150–400)
RBC: 4.77 MIL/uL (ref 4.22–5.81)
RDW: 13.9 % (ref 11.5–15.5)
WBC: 9 10*3/uL (ref 4.0–10.5)

## 2017-10-10 LAB — RAPID URINE DRUG SCREEN, HOSP PERFORMED
AMPHETAMINES: NOT DETECTED
Barbiturates: NOT DETECTED
Benzodiazepines: NOT DETECTED
Cocaine: POSITIVE — AB
Opiates: NOT DETECTED
TETRAHYDROCANNABINOL: POSITIVE — AB

## 2017-10-10 LAB — ETHANOL: Alcohol, Ethyl (B): 128 mg/dL — ABNORMAL HIGH (ref <10)

## 2017-10-10 LAB — SALICYLATE LEVEL

## 2017-10-10 LAB — ACETAMINOPHEN LEVEL: Acetaminophen (Tylenol), Serum: <10 ug/mL — ABNORMAL LOW (ref 10–30)

## 2017-10-10 MED ORDER — ACETAMINOPHEN 325 MG PO TABS
650.0000 mg | ORAL_TABLET | Freq: Four times a day (QID) | ORAL | Status: DC | PRN
  Administered 2017-10-17: 650 mg via ORAL
  Filled 2017-10-10: qty 2

## 2017-10-10 MED ORDER — NAPROXEN 500 MG PO TABS
ORAL_TABLET | ORAL | Status: AC
  Filled 2017-10-10: qty 1

## 2017-10-10 MED ORDER — LORAZEPAM 2 MG/ML IJ SOLN
INTRAMUSCULAR | Status: AC
  Filled 2017-10-10: qty 1

## 2017-10-10 MED ORDER — LORAZEPAM 1 MG PO TABS: 1.0000 mg | ORAL_TABLET | ORAL | Status: DC | PRN

## 2017-10-10 MED ORDER — TRAZODONE HCL 100 MG PO TABS: 100.0000 mg | ORAL_TABLET | Freq: Every day | ORAL | Status: DC

## 2017-10-10 MED ORDER — NAPROXEN 500 MG PO TABS
500.0000 mg | ORAL_TABLET | Freq: Once | ORAL | Status: AC
  Administered 2017-10-10: 500 mg via ORAL

## 2017-10-10 MED ORDER — ALUM & MAG HYDROXIDE-SIMETH 200-200-20 MG/5ML PO SUSP: 30.0000 mL | ORAL | Status: DC | PRN

## 2017-10-10 MED ORDER — MAGNESIUM HYDROXIDE 400 MG/5ML PO SUSP: 30.0000 mL | Freq: Every day | ORAL | Status: DC | PRN

## 2017-10-10 MED ORDER — OLANZAPINE 5 MG PO TABS
5.0000 mg | ORAL_TABLET | Freq: Every day | ORAL | Status: DC
  Filled 2017-10-10: qty 1

## 2017-10-10 MED ORDER — OLANZAPINE 5 MG PO TABS
5.0000 mg | ORAL_TABLET | Freq: Once | ORAL | Status: AC
  Administered 2017-10-10: 5 mg via ORAL
  Filled 2017-10-10: qty 1
  Filled 2017-10-10: qty 2

## 2017-10-10 MED ORDER — RISPERIDONE 1 MG PO TBDP: 2.0000 mg | ORAL_TABLET | Freq: Three times a day (TID) | ORAL | Status: DC | PRN

## 2017-10-10 MED ORDER — LORAZEPAM 2 MG/ML IJ SOLN
2.0000 mg | Freq: Once | INTRAMUSCULAR | Status: AC
  Administered 2017-10-10: 2 mg via INTRAMUSCULAR

## 2017-10-10 MED ORDER — HYDROXYZINE HCL 25 MG PO TABS
25.0000 mg | ORAL_TABLET | Freq: Three times a day (TID) | ORAL | Status: DC | PRN
  Administered 2017-10-10: 25 mg via ORAL

## 2017-10-10 MED ORDER — OLANZAPINE 5 MG PO TABS
5.0000 mg | ORAL_TABLET | Freq: Every day | ORAL | Status: DC
  Administered 2017-10-10: 5 mg via ORAL
  Filled 2017-10-10: qty 1

## 2017-10-10 MED ORDER — ZOLPIDEM TARTRATE 5 MG PO TABS: 5.0000 mg | ORAL_TABLET | Freq: Every evening | ORAL | Status: DC | PRN

## 2017-10-10 MED ORDER — ACETAMINOPHEN 325 MG PO TABS: 650.0000 mg | ORAL_TABLET | ORAL | Status: DC | PRN

## 2017-10-10 MED ORDER — OLANZAPINE 5 MG PO TABS
5.0000 mg | ORAL_TABLET | Freq: Once | ORAL | Status: AC
  Administered 2017-10-10: 5 mg via ORAL
  Filled 2017-10-10: qty 1

## 2017-10-10 MED ORDER — NICOTINE 21 MG/24HR TD PT24
21.0000 mg | MEDICATED_PATCH | Freq: Every day | TRANSDERMAL | Status: DC
  Administered 2017-10-10 – 2017-10-19 (×8): 21 mg via TRANSDERMAL
  Filled 2017-10-10 (×13): qty 1

## 2017-10-10 MED ORDER — ZIPRASIDONE MESYLATE 20 MG IM SOLR: 20.0000 mg | INTRAMUSCULAR | Status: DC | PRN

## 2017-10-10 MED ORDER — TRAZODONE HCL 50 MG PO TABS
50.0000 mg | ORAL_TABLET | Freq: Every evening | ORAL | Status: DC | PRN
  Administered 2017-10-10: 50 mg via ORAL
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
## 2017-10-10 NOTE — ED Notes (Signed)
Pt will be for inpatient admission in the am

## 2017-10-10 NOTE — ED Provider Notes (Signed)
[facility]-EMERGENCY DEPT Provider Note   CSN: [id number] Arrival date & time: 10/09/17  2210     History   Chief Complaint Chief Complaint  Patient presents with  . Suicidal    HPI Jett Divens is a 57 y.o. male.  HPI 57 year old male comes in with chief complaint of suicidal ideation. Patient states that he has been hearing voices for the last several days.  He has been diagnosed with schizophrenia, however he has not been taking his medications as prescribed.  Patient is unsure what medications he is supposed to be on.  His last admission for psychiatric reason was several months ago.  Patient denies any substance abuse.  He is having headaches at the moment because of the voices.   History reviewed. No pertinent past medical history.  There are no active problems to display for this patient.   History reviewed. No pertinent surgical history.      Home Medications    Prior to Admission medications   Medication Sig Start Date End Date Taking? Authorizing Provider  cephALEXin (KEFLEX) 500 MG capsule Take 1 capsule (500 mg total) by mouth 4 (four) times daily. Patient not taking: Reported on 10/10/2017 07/26/14   Patel-Mills, Lorelle Formosa, PA-C  cephALEXin (KEFLEX) 500 MG capsule Take 1 capsule (500 mg total) by mouth 4 (four) times daily. Patient not taking: Reported on 10/10/2017 07/28/14   Jaynie Crumble, PA-C  oxyCODONE-acetaminophen (PERCOCET) 5-325 MG per tablet Take 1 tablet by mouth every 4 (four) hours as needed for severe pain. Patient not taking: Reported on 10/10/2017 07/28/14   Jaynie Crumble, PA-C  oxyCODONE-acetaminophen (PERCOCET/ROXICET) 5-325 MG per tablet Take 2 tablets by mouth every 4 (four) hours as needed for severe pain. Patient not taking: Reported on 10/10/2017 07/26/14   Patel-Mills, Lorelle Formosa, PA-C  sulfamethoxazole-trimethoprim (BACTRIM DS,SEPTRA DS) 800-160 MG per tablet Take 1 tablet by mouth 2 (two) times daily. Patient not  taking: Reported on 10/10/2017 07/26/14   Patel-Mills, Lorelle Formosa, PA-C  traMADol (ULTRAM) 50 MG tablet Take 1 tablet (50 mg total) by mouth every 6 (six) hours as needed. Patient not taking: Reported on 10/10/2017 03/27/16   Eyvonne Mechanic, PA-C    Family History History reviewed. No pertinent family history.  Social History Social History   Tobacco Use  . Smoking status: Current Every Day Smoker    Packs/day: 0.50  . Smokeless tobacco: Never Used  Substance Use Topics  . Alcohol use: No  . Drug use: No     Allergies   Patient has no known allergies.   Review of Systems Review of Systems  Constitutional: Positive for activity change.  Respiratory: Negative for shortness of breath.   Cardiovascular: Negative for chest pain.  Gastrointestinal: Negative for nausea and vomiting.  Allergic/Immunologic: Negative for immunocompromised state.  Hematological: Does not bruise/bleed easily.  Psychiatric/Behavioral: Positive for agitation and suicidal ideas.  All other systems reviewed and are negative.    Physical Exam Updated Vital Signs BP (!) 148/89 (BP Location: Left Arm)   Pulse 97   Temp 98.4 F (36.9 C) (Oral)   Resp 18   Ht 6\' 2"  (1.88 m)   Wt 81.6 kg   SpO2 100%   BMI 23.11 kg/m   Physical Exam  Constitutional: He is oriented to person, place, and time. He appears well-developed.  HENT:  Head: Atraumatic.  Neck: Neck supple.  Cardiovascular: Normal rate.  Pulmonary/Chest: Effort normal.  Neurological: He is alert and oriented to person, place, and time.  Skin:  Skin is warm.  Psychiatric: He has a normal mood and affect. His behavior is normal.  Nursing note and vitals reviewed.    ED Treatments / Results  Labs (all labs ordered are listed, but only abnormal results are displayed) Labs Reviewed  ETHANOL - Abnormal; Notable for the following components:      Result Value   Alcohol, Ethyl (B) 128 (*)    All other components within normal limits    ACETAMINOPHEN LEVEL - Abnormal; Notable for the following components:   Acetaminophen (Tylenol), Serum <10 (*)    All other components within normal limits  RAPID URINE DRUG SCREEN, HOSP PERFORMED - Abnormal; Notable for the following components:   Cocaine POSITIVE (*)    Tetrahydrocannabinol POSITIVE (*)    All other components within normal limits  COMPREHENSIVE METABOLIC PANEL  SALICYLATE LEVEL  CBC    EKG EKG Interpretation  Date/Time:  Wednesday October 10 2017 02:10:10 EDT Ventricular Rate:  67 PR Interval:  146 QRS Duration: 98 QT Interval:  404 QTC Calculation: 426 R Axis:   78 Text Interpretation:  Normal sinus rhythm Minimal voltage criteria for LVH, may be normal variant Borderline ECG No acute changes No old tracing to compare Confirmed by Derwood Kaplan [phone]) on 10/10/2017 2:16:18 AM   Radiology No results found.  Procedures Procedures (including critical care time)  Medications Ordered in ED Medications  risperiDONE (RISPERDAL M-TABS) disintegrating tablet 2 mg (has no administration in time range)    And  LORazepam (ATIVAN) tablet 1 mg (has no administration in time range)    And  ziprasidone (GEODON) injection 20 mg (has no administration in time range)  acetaminophen (TYLENOL) tablet 650 mg (has no administration in time range)  zolpidem (AMBIEN) tablet 5 mg (has no administration in time range)  acetaminophen (TYLENOL) tablet 650 mg (650 mg Oral Given 10/10/17 0000)  LORazepam (ATIVAN) injection 2 mg (2 mg Intramuscular Given 10/10/17 0156)  naproxen (NAPROSYN) tablet 500 mg (500 mg Oral Given 10/10/17 0141)     Initial Impression / Assessment and Plan / ED Course  I have reviewed the triage vital signs and the nursing notes.  Pertinent labs & imaging results that were available during my care of the patient were reviewed by me and considered in my medical decision making (see chart for details).    57 year old male comes in with chief complaint of  suicidal ideation.  He is also hearing voices that have gotten worse over the past few days.  Patient is having headache.  He has no focal neurologic deficit on exam.  He is cocaine positive, however he is ambulating well and there is no mental status changes.  We doubt that there is any intracranial bleed.  Patient is medically cleared for psychiatric evaluation.    Final Clinical Impressions(s) / ED Diagnoses   Final diagnoses:  Psychosis, unspecified psychosis type (HCC)  Suicidal behavior without attempted self-injury    ED Discharge Orders    None       Derwood Kaplan, MD 10/10/17 [phone]

## 2017-10-10 NOTE — Progress Notes (Signed)
Adult Psychoeducational Group Note  Date:  10/10/2017 Time:  10:35 PM  Group Topic/Focus:  Wrap-Up Group:   The focus of this group is to help patients review their daily goal of treatment and discuss progress on daily workbooks.  Participation Level:  Minimal  Participation Quality:  Appropriate  Affect:  Depressed  Cognitive:  Appropriate  Insight: Limited  Engagement in Group:  None  Modes of Intervention:  Socialization and Support  Additional Comments:  Patient attended and participated in group tonight. He reports that today was just another day. He don't want to share.  Lita Mains [facility] 10/10/2017, 10:35 PM

## 2017-10-10 NOTE — Progress Notes (Signed)
Nursing Progress Note: 7p-7a D: Pt currently presents with a depressed/anxious affect and behavior. Pt states "the voices are constant and they suck." Interacting appropriately with the milieu. Pt reports poor sleep during the previous night with current medication regimen. Pt did attend wrap-up group.  A: Pt provided with medications per providers orders. Pt's labs and vitals were monitored throughout the night. Pt supported emotionally and encouraged to express concerns and questions. Pt educated on medications.  R: Pt's safety ensured with 15 minute and environmental checks. Pt currently denies SI, HI, and VH and endorses AH. Pt verbally contracts to seek staff if SI,HI, or AVH occurs and to consult with staff before acting on any harmful thoughts. Will continue to monitor.

## 2017-10-10 NOTE — BH Assessment (Signed)
Admission DAR Note: Pt is a 57 y/o AAM transferred from [facility] to [facility] for continuation of care related to suicidal ideation and self harm thoughts. Pt presents guarded with flat affect, avertive eye contact and depressed mood. Minimal on assessment, "I am here because I want to hurt myself". Denies SI, HI, AVH and pain when assessed "not right now but when I do hear the voices; it's more than 1 voice and they command me to hurt myself, I don't feel safe right now". Reports he's homeless "my home town is [place], I have family in [place] but I don't mess with nobody". States he does not have any support. Stays in hotels at intervals till his disability check runs off. Reports poor sleep and poor appetite. Skin assessment done and belongings searched per protocol. Items deemed contraband placed in locker. Pt's skin is dry and intact. Tattoo noted bilateral deltoid. Emotional support and availability offered. Encouraged pt to voice concerns, attend to ADLS and comply with treatment regimen including groups. Unit orientation done, routines discussed and care plan reviewed with pt; understanding verbalized. Q 15 minutes safety checks initiated without self harm gestures or outburst to note at this time.

## 2017-10-10 NOTE — Tx Team (Signed)
Initial Treatment Plan 10/10/2017 6:08 PM Edwin Henry EML:[id number]    PATIENT STRESSORS: Financial difficulties Occupational concerns   PATIENT STRENGTHS: Ability for insight Capable of independent living Communication skills Motivation for treatment/growth Physical Health   PATIENT IDENTIFIED PROBLEMS: Alteration in mood (Depression) "I'm helpless, I'm just down".  Alteration in sleep patteren  Risk for self harm                 DISCHARGE CRITERIA:  Improved stabilization in mood, thinking, and/or behavior Verbal commitment to aftercare and medication compliance  PRELIMINARY DISCHARGE PLAN: Outpatient therapy Placement in alternative living arrangements  PATIENT/FAMILY INVOLVEMENT: This treatment plan has been presented to and reviewed with the patient, Edwin Henry. The patient have been given the opportunity to ask questions and make suggestions.  Sherryl Manges, RN 10/10/2017, 6:08 PM

## 2017-10-10 NOTE — BH Assessment (Signed)
[facility] Assessment Progress Note  Per Nelly Rout, MD, this pt requires psychiatric hospitalization at this time.  Malva Limes, RN, [facility] has assigned pt to [facility] Rm [id number]; [facility] will be ready to receive pt between 15:00 and 15:30.  Pt has signed Voluntary Admission and Consent for Treatment, as well as Consent to Release Information to no one, and signed forms have been faxed to [facility].  Pt's nurse has been notified, and agrees to send original paperwork along with pt via [place], and to call report to [phone].  Doylene Canning, [place] [facility] Coordinator [phone]

## 2017-10-10 NOTE — Plan of Care (Signed)
  Problem: Education: Goal: Knowledge of [facility] Education information/materials will improve Outcome: Not Progressing Goal: Emotional status will improve Outcome: Not Progressing Goal: Mental status will improve Outcome: Not Progressing Goal: Verbalization of understanding the information provided will improve Outcome: Not Progressing   Problem: Activity: Goal: Interest or engagement in activities will improve Outcome: Not Progressing Goal: Sleeping patterns will improve Outcome: Not Progressing   Problem: Coping: Goal: Ability to verbalize frustrations and anger appropriately will improve Outcome: Not Progressing Goal: Ability to demonstrate self-control will improve Outcome: Not Progressing   Problem: Health Behavior/Discharge Planning: Goal: Identification of resources available to assist in meeting health care needs will improve Outcome: Not Progressing Goal: Compliance with treatment plan for underlying cause of condition will improve Outcome: Not Progressing   Problem: Physical Regulation: Goal: Ability to maintain clinical measurements within normal limits will improve Outcome: Not Progressing   Problem: Safety: Goal: Periods of time without injury will increase Outcome: Not Progressing   Problem: Education: Goal: Knowledge of  General Education information/materials will improve Outcome: Not Progressing Goal: Emotional status will improve Outcome: Not Progressing Goal: Mental status will improve Outcome: Not Progressing Goal: Verbalization of understanding the information provided will improve Outcome: Not Progressing   Problem: Health Behavior/Discharge Planning: Goal: Identification of resources available to assist in meeting health care needs will improve Outcome: Not Progressing Goal: Compliance with treatment plan for underlying cause of condition will improve Outcome: Not Progressing   Problem: Safety: Goal: Periods of time  without injury will increase Outcome: Not Progressing Pt is a new admit on 500 hall. Problem: Coping: Goal: Coping ability will improve Outcome: Not Progressing Goal: Will verbalize feelings Outcome: Not Progressing   Problem: Safety: Goal: Ability to disclose and discuss suicidal ideas will improve Outcome: Not Progressing Goal: Ability to identify and utilize support systems that promote safety will improve Outcome: Not Progressing   Problem: Health Behavior/Discharge Planning: Goal: Identification of resources available to assist in meeting health care needs will improve Outcome: Not Progressing

## 2017-10-11 DIAGNOSIS — Z811 Family history of alcohol abuse and dependence: Secondary | ICD-10-CM

## 2017-10-11 DIAGNOSIS — F17210 Nicotine dependence, cigarettes, uncomplicated: Secondary | ICD-10-CM

## 2017-10-11 DIAGNOSIS — F419 Anxiety disorder, unspecified: Secondary | ICD-10-CM

## 2017-10-11 DIAGNOSIS — G4700 Insomnia, unspecified: Secondary | ICD-10-CM

## 2017-10-11 DIAGNOSIS — R45851 Suicidal ideations: Secondary | ICD-10-CM

## 2017-10-11 DIAGNOSIS — F209 Schizophrenia, unspecified: Secondary | ICD-10-CM

## 2017-10-11 LAB — LIPID PANEL
CHOLESTEROL: 217 mg/dL — AB (ref 0–200)
HDL: 41 mg/dL (ref >40)
LDL Cholesterol: UNDETERMINED mg/dL (ref 0–99)
TRIGLYCERIDES: 423 mg/dL — AB (ref <150)
Total CHOL/HDL Ratio: 5.3 RATIO
VLDL: UNDETERMINED mg/dL (ref 0–40)

## 2017-10-11 LAB — HEMOGLOBIN A1C
HEMOGLOBIN A1C: 5.5 % (ref 4.8–5.6)
Mean Plasma Glucose: 111.15 mg/dL

## 2017-10-11 LAB — TSH: TSH: 1.323 u[IU]/mL (ref 0.350–4.500)

## 2017-10-11 MED ORDER — ENSURE ENLIVE PO LIQD: 237.0000 mL | ORAL | Status: DC

## 2017-10-11 MED ORDER — OLANZAPINE 10 MG PO TABS
10.0000 mg | ORAL_TABLET | Freq: Every day | ORAL | Status: DC
Start: 2017-10-11 — End: 2017-10-12
  Administered 2017-10-11: 10 mg via ORAL
  Filled 2017-10-11 (×4): qty 1

## 2017-10-11 MED ORDER — HYDROXYZINE HCL 50 MG PO TABS
50.0000 mg | ORAL_TABLET | Freq: Three times a day (TID) | ORAL | Status: DC | PRN
  Administered 2017-10-11 – 2017-10-12 (×3): 50 mg via ORAL
  Filled 2017-10-11 (×4): qty 1

## 2017-10-11 MED ORDER — ZIPRASIDONE MESYLATE 20 MG IM SOLR: 20.0000 mg | Freq: Two times a day (BID) | INTRAMUSCULAR | Status: DC | PRN

## 2017-10-11 MED ORDER — TRAZODONE HCL 50 MG PO TABS
50.0000 mg | ORAL_TABLET | Freq: Every evening | ORAL | Status: DC | PRN
  Administered 2017-10-11 (×2): 50 mg via ORAL
  Filled 2017-10-11 (×2): qty 1

## 2017-10-11 MED ORDER — OLANZAPINE 5 MG PO TBDP
5.0000 mg | ORAL_TABLET | Freq: Four times a day (QID) | ORAL | Status: DC | PRN
  Administered 2017-10-11: 5 mg via ORAL
  Filled 2017-10-11: qty 1

## 2017-10-11 MED ORDER — ENSURE ENLIVE PO LIQD
1.0000 | Freq: Two times a day (BID) | ORAL | Status: DC
  Administered 2017-10-11 – 2017-10-18 (×10): 237 mL via ORAL

## 2017-10-11 MED ORDER — ADULT MULTIVITAMIN W/MINERALS CH
1.0000 | ORAL_TABLET | Freq: Every day | ORAL | Status: DC
  Administered 2017-10-12 – 2017-10-19 (×8): 1 via ORAL
  Filled 2017-10-11 (×12): qty 1

## 2017-10-11 NOTE — Progress Notes (Signed)
The patient was pleased with the fact that he went outside for fresh air and because he worked on trying to get his thoughts together. His goal for tomorrow is to have a better day than today.

## 2017-10-11 NOTE — Progress Notes (Signed)
NUTRITION ASSESSMENT  Pt identified as at risk on the Malnutrition Screen Tool  INTERVENTION: Supplements: will order Ensure Enlive once/day, this supplement provides 350 kcal and 20 grams of protein.   NUTRITION DIAGNOSIS: Unintentional weight loss related to sub-optimal intake as evidenced by pt report.   Goal: Pt to meet >/= 90% of their estimated nutrition needs.  Monitor:  PO intake  Assessment:  Patient admitted for SI/HI and auditory hallucinations. Patient with hx of schizophrenia and is not currently on medications or seeing a mental health care professional. Patient has had difficulty with sleeping which he attributes to intense auditory hallucinations.   Limited weight history available. Patient currently weighs 162 lb and weight on 03/27/16 was 188 lb. This indicates 26 lb weight loss (14% body weight) in the past 18 months. This is not significant for time frame, but unsure if weight loss has occurred more acutely. Patient stays in motels and does not weigh himself.    57 y.o. male  Height: Ht Readings from Last 1 Encounters:  10/10/17 6\' 2"  (1.88 m)    Weight: Wt Readings from Last 1 Encounters:  10/10/17 73.5 kg    Weight Hx: Wt Readings from Last 10 Encounters:  10/10/17 73.5 kg  10/09/17 81.6 kg  03/27/16 85.3 kg    BMI:  Body mass index is 20.8 kg/m. Pt meets criteria for normal weight based on current BMI.  Estimated Nutritional Needs: Kcal: 25-30 kcal/kg Protein: > 1 gram protein/kg Fluid: 1 ml/kcal  Diet Order:  Diet Order            Diet regular Room service appropriate? Yes; Fluid consistency: Thin  Diet effective now             Pt is also offered choice of unit snacks mid-morning and mid-afternoon.  Pt is eating as desired.   Lab results and medications reviewed.     Trenton Gammon, MS, RD, LDN, [facility] Inpatient Clinical Dietitian Pager # [phone] After hours/weekend pager # [phone]

## 2017-10-11 NOTE — Progress Notes (Signed)
Recreation Therapy Notes  Date: 9.5.19 Time: 1000 Location: 500 Hall Dayroom  Group Topic: Triggers  Goal Area(s) Addresses:  Patient will identify triggers. Patient will identify ways in which they deal with triggers. Patient will verbalize benefits of knowing what their triggers are.   Intervention: Worksheet  Activity: Triggers.  Patients were given a worksheet to identify their top three trigger, what they do to avoid or reduce contact with triggers and how they deal with triggers head on.  Education: Communication, Discharge Planning  Education Outcome: Acknowledges understanding/In group clarification offered/Needs additional education.   Clinical Observations/Feedback: Pt did not attend group.     Caroll Rancher, LRT/CTRS     Lillia Abed, Lajarvis Italiano A 10/11/2017 11:35 AM
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
## 2017-10-11 NOTE — Progress Notes (Signed)
Recreation Therapy Notes  INPATIENT RECREATION THERAPY ASSESSMENT  Patient Details Name: Eril Timpson MRN: [id number] DOB: 18-Dec-1960 Today's Date: 10/11/2017       Information Obtained From: Patient  Able to Participate in Assessment/Interview: Yes  Patient Presentation: Alert, Oriented  Reason for Admission (Per Patient): Other (Comments)(Voices)  Patient Stressors: Family, Friends, Work, Other (Comment)(Financial)  Coping Skills:   Film/video editor, TV, Music, Exercise, Deep Breathing, Meditate, Talk, Prayer, Avoidance, Hot Bath/Shower  Leisure Interests (2+):  Sports - Basketball, [employer] - Therapist, music, Technical brewer - [employer] of Recreation/Participation: Other (Comment)(Pt stated it's been a long time.)  Awareness of Community Resources:  Yes  Community Resources:  Park, Engineering geologist, Other (Comment)(Stores)  Current Use: Yes  If no, Barriers?:    Expressed Interest in [employer] Information: No  [place] of Residence:  [place]  Patient Main Form of Transportation: Therapist, music  Patient Strengths:  Lifting  Patient Identified Areas of Improvement:  "Get head together"  Patient Goal for Hospitalization:  "Get head together"  Current SI (including self-harm):  No  Current HI:  No  Current AVH: Yes(Pt rated it an 8 out of 10.  Pt stated the voices were geared towards him hurting himself but stated he would talk to staff if it got that bad.)  Staff Intervention Plan: Group Attendance, Collaborate with Interdisciplinary Treatment Team  Consent to Intern Participation: N/A   Caroll Rancher, LRT/CTRS  Lillia Abed, Haizley Cannella A 10/11/2017, 1:51 PM

## 2017-10-11 NOTE — Progress Notes (Signed)
Patient has been experiencing pervasive auditory hallucinations.  Patient states that there is a multiple voices having conversations in his head and that he is unable to get any relief. Patient has been pleasant but distressed by the voices.  Patient compliant with medications.  Assess patient for safety, offer medications as prescribed, engage patient in 1:1 staff talks.   Patient able to contract for safety.  Continue to monitor as planned.

## 2017-10-12 LAB — PROLACTIN: PROLACTIN: 22.1 ng/mL — AB (ref 4.0–15.2)

## 2017-10-12 MED ORDER — BENZTROPINE MESYLATE 1 MG/ML IJ SOLN: 1.0000 mg | Freq: Two times a day (BID) | INTRAMUSCULAR | Status: DC | PRN

## 2017-10-12 MED ORDER — HALOPERIDOL LACTATE 5 MG/ML IJ SOLN: 5.0000 mg | Freq: Four times a day (QID) | INTRAMUSCULAR | Status: DC | PRN

## 2017-10-12 MED ORDER — HALOPERIDOL 5 MG PO TABS
5.0000 mg | ORAL_TABLET | Freq: Two times a day (BID) | ORAL | Status: DC
  Administered 2017-10-12 – 2017-10-15 (×7): 5 mg via ORAL
  Filled 2017-10-12 (×10): qty 1

## 2017-10-12 MED ORDER — BENZTROPINE MESYLATE 1 MG PO TABS
1.0000 mg | ORAL_TABLET | Freq: Two times a day (BID) | ORAL | Status: DC | PRN
  Administered 2017-10-12: 1 mg via ORAL
  Filled 2017-10-12: qty 1

## 2017-10-12 MED ORDER — HALOPERIDOL 1 MG PO TABS: 1.0000 mg | ORAL_TABLET | Freq: Four times a day (QID) | ORAL | Status: DC | PRN

## 2017-10-12 MED ORDER — HYDROXYZINE HCL 50 MG PO TABS
50.0000 mg | ORAL_TABLET | Freq: Four times a day (QID) | ORAL | Status: DC | PRN
  Administered 2017-10-14 – 2017-10-18 (×3): 50 mg via ORAL
  Filled 2017-10-12 (×3): qty 1

## 2017-10-12 MED ORDER — TRAZODONE HCL 100 MG PO TABS
100.0000 mg | ORAL_TABLET | Freq: Every evening | ORAL | Status: DC | PRN
  Administered 2017-10-12 – 2017-10-13 (×4): 100 mg via ORAL
  Filled 2017-10-12 (×3): qty 1

## 2017-10-12 MED ORDER — HALOPERIDOL 5 MG PO TABS
5.0000 mg | ORAL_TABLET | Freq: Four times a day (QID) | ORAL | Status: DC | PRN
  Administered 2017-10-12 – 2017-10-15 (×3): 5 mg via ORAL
  Filled 2017-10-12 (×3): qty 1

## 2017-10-12 NOTE — Progress Notes (Signed)
Patient ID: Edwin Henry, male   DOB: Sep 25, 1960, 57 y.o.   MRN: [id number] DAR Note: Pt continue to endorse AVH; denies co27mmand hallucinations. Pt endorse moderate anxiety. Pt was concerned about sleeping all evening. Pt denies SI, HI, or pain. Pt was med compliant. All patient's questions and concerns addressed. Support, encouragement, and safe environment provided. 15-minute safety checks continue.  Pt did attend wrap-up group.

## 2017-10-12 NOTE — Plan of Care (Signed)
  Problem: Activity: Goal: Interest or engagement in activities will improve Outcome: Progressing   Problem: Safety: Goal: Periods of time without injury will increase Outcome: Progressing  DAR NOTE: Patient presents with anxious affect and depressed mood.  Denies suicidal thoughts but reports auditory and visual hallucinations.  Described energy level as low and concentration as poor.  Rates depression at 7, hopelessness at 7, and anxiety at 7.  Maintained on routine safety checks.  Medications given as prescribed.  Support and encouragement offered as needed.  Attended group and participated.  States goal for today is "talk to dr/go to group."  Patient observed socializing with peers in the dayroom.  Patient requested and received Haldol 5 mg and Cogentin given for complain of agitation with good effect.

## 2017-10-12 NOTE — Tx Team (Signed)
Interdisciplinary Treatment and Diagnostic Plan Update  10/12/2017 Time of Session: 10:26 AM  [name] MRN: [id number]  Principal Diagnosis: Schizophrenia, unspecified ([place])  Secondary Diagnoses: Principal Problem:   Schizophrenia, unspecified ([place])   Current Medications:  Current Facility-Administered Medications  Medication Dose Route Frequency Provider Last Rate Last Dose  . acetaminophen (TYLENOL) tablet 650 mg  650 mg Oral Q6H PRN Lindon Romp A, NP      . alum & mag hydroxide-simeth (MAALOX/MYLANTA) 200-200-20 MG/5ML suspension 30 mL  30 mL Oral Q4H PRN Lindon Romp A, NP      . feeding supplement (ENSURE ENLIVE) (ENSURE ENLIVE) liquid 237 mL  1 Bottle Oral BID BM Pennelope Bracken, MD   237 mL at 10/12/17 1021  . hydrOXYzine (ATARAX/VISTARIL) tablet 50 mg  50 mg Oral TID PRN Pennelope Bracken, MD   50 mg at 10/11/17 2219  . magnesium hydroxide (MILK OF MAGNESIA) suspension 30 mL  30 mL Oral Daily PRN Lindon Romp A, NP      . multivitamin with minerals tablet 1 tablet  1 tablet Oral Daily [place], Christopher T, MD      . nicotine (NICODERM CQ - dosed in mg/24 hours) patch 21 mg  21 mg Transdermal Daily Pennelope Bracken, MD   21 mg at 10/12/17 3154  . OLANZapine (ZYPREXA) tablet 10 mg  10 mg Oral QHS Pennelope Bracken, MD   10 mg at 10/11/17 2053  . OLANZapine zydis (ZYPREXA) disintegrating tablet 5 mg  5 mg Oral Q6H PRN Pennelope Bracken, MD   5 mg at 10/11/17 1108   Or  . ziprasidone (GEODON) injection 20 mg  20 mg Intramuscular Q12H PRN Pennelope Bracken, MD      . traZODone (DESYREL) tablet 50 mg  50 mg Oral QHS PRN,MR X 1 Pennelope Bracken, MD   50 mg at 10/11/17 2218    PTA Medications: Medications Prior to Admission  Medication Sig Dispense Refill Last Dose  . cephALEXin (KEFLEX) 500 MG capsule Take 1 capsule (500 mg total) by mouth 4 (four) times daily. (Patient not taking: Reported on 10/10/2017) 20 capsule 0  Completed Course at Unknown time  . cephALEXin (KEFLEX) 500 MG capsule Take 1 capsule (500 mg total) by mouth 4 (four) times daily. (Patient not taking: Reported on 10/10/2017) 20 capsule 0 Completed Course at Unknown time  . oxyCODONE-acetaminophen (PERCOCET) 5-325 MG per tablet Take 1 tablet by mouth every 4 (four) hours as needed for severe pain. (Patient not taking: Reported on 10/10/2017) 10 tablet 0 Completed Course at Unknown time  . oxyCODONE-acetaminophen (PERCOCET/ROXICET) 5-325 MG per tablet Take 2 tablets by mouth every 4 (four) hours as needed for severe pain. (Patient not taking: Reported on 10/10/2017) 6 tablet 0 Completed Course at Unknown time  . sulfamethoxazole-trimethoprim (BACTRIM DS,SEPTRA DS) 800-160 MG per tablet Take 1 tablet by mouth 2 (two) times daily. (Patient not taking: Reported on 10/10/2017) 10 tablet 0 Completed Course at Unknown time  . traMADol (ULTRAM) 50 MG tablet Take 1 tablet (50 mg total) by mouth every 6 (six) hours as needed. (Patient not taking: Reported on 10/10/2017) 15 tablet 0 Completed Course at Unknown time    Patient Stressors: Financial difficulties Occupational concerns  Patient Strengths: Ability for insight Capable of independent living Agricultural engineer for treatment/growth Physical Health  Treatment Modalities: Medication Management, Group therapy, Case management,  1 to 1 session with clinician, Psychoeducation, Recreational therapy.   Physician Treatment Plan for Primary Diagnosis:  Schizophrenia, unspecified ([place]) Long Term Goal(s): Improvement in symptoms so as ready for discharge  Short Term Goals: Ability to identify and develop effective coping behaviors will improve Ability to maintain clinical measurements within normal limits will improve  Medication Management: Evaluate patient's response, side effects, and tolerance of medication regimen.  Therapeutic Interventions: 1 to 1 sessions, Unit Group sessions and Medication  administration.  Evaluation of Outcomes: Progressing  Physician Treatment Plan for Secondary Diagnosis: Principal Problem:   Schizophrenia, unspecified ([place])   Long Term Goal(s): Improvement in symptoms so as ready for discharge  Short Term Goals: Ability to identify and develop effective coping behaviors will improve Ability to maintain clinical measurements within normal limits will improve  Medication Management: Evaluate patient's response, side effects, and tolerance of medication regimen.  Therapeutic Interventions: 1 to 1 sessions, Unit Group sessions and Medication administration.  Evaluation of Outcomes: Progressing   RN Treatment Plan for Primary Diagnosis: Schizophrenia, unspecified ([place]) Long Term Goal(s): Knowledge of disease and therapeutic regimen to maintain health will improve  Short Term Goals: Ability to identify and develop effective coping behaviors will improve and Compliance with prescribed medications will improve  Medication Management: RN will administer medications as ordered by provider, will assess and evaluate patient's response and provide education to patient for prescribed medication. RN will report any adverse and/or side effects to prescribing provider.  Therapeutic Interventions: 1 on 1 counseling sessions, Psychoeducation, Medication administration, Evaluate responses to treatment, Monitor vital signs and CBGs as ordered, Perform/monitor CIWA, COWS, AIMS and Fall Risk screenings as ordered, Perform wound care treatments as ordered.  Evaluation of Outcomes: Progressing   LCSW Treatment Plan for Primary Diagnosis: Schizophrenia, unspecified ([place]) Long Term Goal(s): Safe transition to appropriate next level of care at discharge, Engage patient in therapeutic group addressing interpersonal concerns.  Short Term Goals: Engage patient in aftercare planning with referrals and resources  Therapeutic Interventions: Assess for all discharge needs, 1 to  1 time with Social worker, Explore available resources and support systems, Assess for adequacy in community support network, Educate family and significant other(s) on suicide prevention, Complete Psychosocial Assessment, Interpersonal group therapy.  Evaluation of Outcomes: Met  Return home, follow up outpt   Progress in Treatment: Attending groups: Yes Participating in groups: Yes Taking medication as prescribed: Yes Toleration medication: Yes, no side effects reported at this time Family/Significant other contact made: No Patient understands diagnosis: Yes AEB asking for help with psychosis Discussing patient identified problems/goals with staff: Yes Medical problems stabilized or resolved: Yes Denies suicidal/homicidal ideation: Yes Issues/concerns per patient self-inventory: None Other: N/A  New problem(s) identified: None identified at this time.   New Short Term/Long Term Goal(s): "Get what's going on with me under control.  I was hearing voices and seeing things that I shouldn't."   Discharge Plan or Barriers:   Reason for Continuation of Hospitalization:  Depression Hallucinations  Medication stabilization   Estimated Length of Stay: 9/11  Attendees: Patient: Edwin Henry 10/12/2017  10:26 AM  Physician: Maris Berger, MD 10/12/2017  10:26 AM  Nursing: Sena Hitch, RN 10/12/2017  10:26 AM  RN Care Manager: Lars Pinks, RN 10/12/2017  10:26 AM  Social Worker: Ripley Fraise 10/12/2017  10:26 AM  Recreational Therapist: Winfield Cunas 10/12/2017  10:26 AM  Other: Norberto Sorenson 10/12/2017  10:26 AM  Other:  10/12/2017  10:26 AM    Scribe for Treatment Team:  Roque Lias LCSW 10/12/2017 10:26 AM

## 2017-10-12 NOTE — [facility] Group Notes (Signed)
[facility] LCSW Group Therapy  10/12/2017 4:18 PM  Type of Therapy:  Group Therapy  Participation Level:  Minimal  Participation Quality:  Resistant  Affect:  Appropriate  Cognitive:  Appropriate  Insight:  Resistant  Engagement in Therapy:  Resistant  Modes of Intervention:  Exploration  Summary of Progress/Problems:  Participants were asked to describe what hope means to them. Jasmin was attentive to others. He explained that his mind was not in the right frame to be in the group but felt he would probably be able to participate on another day.  Cherie Bohaboy 10/12/2017, 4:18 PM

## 2017-10-12 NOTE — Progress Notes (Signed)
Operating Room Services MD Progress Note  10/12/2017 2:37 PM Edwin Henry  MRN:  [id number] Subjective:    Edwin Henry is a 57 y/o M with history of schizophrenia who was admitted voluntarily from [facility] where he presented with worsening depression, SI without plan, AH, and VH in the context of being off of psychotropic medications for several years. Pt was medically cleared and then transferred to [facility] for additional treatment and stabilization. He was started on trial of zyprexa and observed on the inpatient unit.  Today upon evaluation, pt shares, "I'm doing okay. I'm still having them voices. I was in group and the voices started back up, and I just couldn't do it. I had to leave." Pt reports ongoing AH and VH of seeing his eye colors change in the mirror. He denies SI/HI. He reports that he is feeling depressed and he requests for medication to address that. His sleep was poor. We discussed changing from zyprexa to haldol and also starting trial of lexapro. Pt was in agreement. He had no further questions, comments, or concerns.  Principal Problem: Schizophrenia, unspecified ([facility]) Diagnosis:   Patient Active Problem List   Diagnosis Date Noted  . Schizophrenia, unspecified ([facility]) [F20.9] 10/10/2017   Total Time spent with patient: 30 minutes  Past Psychiatric History: see H&P  Past Medical History: History reviewed. No pertinent past medical history. History reviewed. No pertinent surgical history. Family History: History reviewed. No pertinent family history. Family Psychiatric  History: see H&P Social History:  Social History   Substance and Sexual Activity  Alcohol Use Yes   Comment: 1-2 beers every 2 days     Social History   Substance and Sexual Activity  Drug Use Yes  . Types: Marijuana   Comment: occasional THC use    Social History   Socioeconomic History  . Marital status: Single    Spouse name: Not on file  . Number of children: Not on file  . Years of education: Not on file  .  Highest education level: Not on file  Occupational History  . Not on file  Social Needs  . Financial resource strain: Not on file  . Food insecurity:    Worry: Not on file    Inability: Not on file  . Transportation needs:    Medical: Not on file    Non-medical: Not on file  Tobacco Use  . Smoking status: Current Every Day Smoker    Packs/day: 0.50  . Smokeless tobacco: Never Used  Substance and Sexual Activity  . Alcohol use: Yes    Comment: 1-2 beers every 2 days  . Drug use: Yes    Types: Marijuana    Comment: occasional THC use  . Sexual activity: Not on file  Lifestyle  . Physical activity:    Days per week: Not on file    Minutes per session: Not on file  . Stress: Not on file  Relationships  . Social connections:    Talks on phone: Not on file    Gets together: Not on file    Attends religious service: Not on file    Active member of club or organization: Not on file    Attends meetings of clubs or organizations: Not on file    Relationship status: Not on file  Other Topics Concern  . Not on file  Social History Narrative  . Not on file   Additional Social History:  Sleep: Poor  Appetite:  Good  Current Medications: Current Facility-Administered Medications  Medication Dose Route Frequency Provider Last Rate Last Dose  . acetaminophen (TYLENOL) tablet 650 mg  650 mg Oral Q6H PRN Nira Conn A, NP      . alum & mag hydroxide-simeth (MAALOX/MYLANTA) 200-200-20 MG/5ML suspension 30 mL  30 mL Oral Q4H PRN Nira Conn A, NP      . feeding supplement (ENSURE ENLIVE) (ENSURE ENLIVE) liquid 237 mL  1 Bottle Oral BID BM Micheal Likens, MD   237 mL at 10/12/17 1021  . hydrOXYzine (ATARAX/VISTARIL) tablet 50 mg  50 mg Oral TID PRN Micheal Likens, MD   50 mg at 10/12/17 1405  . magnesium hydroxide (MILK OF MAGNESIA) suspension 30 mL  30 mL Oral Daily PRN Nira Conn A, NP      . multivitamin with minerals tablet  1 tablet  1 tablet Oral Daily Micheal Likens, MD   1 tablet at 10/12/17 1043  . nicotine (NICODERM CQ - dosed in mg/24 hours) patch 21 mg  21 mg Transdermal Daily Micheal Likens, MD   21 mg at 10/12/17 0981  . OLANZapine (ZYPREXA) tablet 10 mg  10 mg Oral QHS Micheal Likens, MD   10 mg at 10/11/17 2053  . OLANZapine zydis (ZYPREXA) disintegrating tablet 5 mg  5 mg Oral Q6H PRN Micheal Likens, MD   5 mg at 10/11/17 1108   Or  . ziprasidone (GEODON) injection 20 mg  20 mg Intramuscular Q12H PRN Micheal Likens, MD      . traZODone (DESYREL) tablet 50 mg  50 mg Oral QHS PRN,MR X 1 Micheal Likens, MD   50 mg at 10/11/17 2218    Lab Results:  Results for orders placed or performed during the hospital encounter of 10/10/17 (from the past 48 hour(s))  Hemoglobin A1c     Status: None   Collection Time: 10/11/17  6:41 AM  Result Value Ref Range   Hgb A1c MFr Bld 5.5 4.8 - 5.6 %    Comment: (NOTE) Pre diabetes:          5.7%-6.4% Diabetes:              >6.4% Glycemic control for   <7.0% adults with diabetes    Mean Plasma Glucose 111.15 mg/dL    Comment: Performed at [facility], [address] [place], [place] [address]  Lipid panel     Status: Abnormal   Collection Time: 10/11/17  6:41 AM  Result Value Ref Range   Cholesterol 217 (H) 0 - 200 mg/dL   Triglycerides 829 (H) <150 mg/dL   HDL 41 >56 mg/dL   Total CHOL/HDL Ratio 5.3 RATIO   VLDL UNABLE TO CALCULATE IF TRIGLYCERIDE OVER 400 mg/dL 0 - 40 mg/dL   LDL Cholesterol UNABLE TO CALCULATE IF TRIGLYCERIDE OVER 400 mg/dL 0 - 99 mg/dL    Comment:        Total Cholesterol/HDL:CHD Risk Coronary Heart Disease Risk Table                     Men   Women  1/2 Average Risk   3.4   3.3  Average Risk       5.0   4.4  2 X Average Risk   9.6   7.1  3 X Average Risk  23.4   11.0        Use the calculated Patient  Ratio above and the CHD Risk Table to determine the patient's CHD  Risk.        ATP III CLASSIFICATION (LDL):  <100     mg/dL   Optimal  161-096  mg/dL   Near or Above                    Optimal  130-159  mg/dL   Borderline  045-409  mg/dL   High  >811     mg/dL   Very High Performed at [facility], [address], [place], [place] [address]   Prolactin     Status: Abnormal   Collection Time: 10/11/17  6:41 AM  Result Value Ref Range   Prolactin 22.1 (H) 4.0 - 15.2 ng/mL    Comment: (NOTE) Performed At: [facility] [address] [place], [place] [id number] Jolene Schimke MD MV:[id number]   TSH     Status: None   Collection Time: 10/11/17  6:41 AM  Result Value Ref Range   TSH 1.323 0.350 - 4.500 uIU/mL    Comment: Performed by a 3rd Generation assay with a functional sensitivity of <=0.01 uIU/mL. Performed at [facility], [address] [place], [place] [address]     Blood Alcohol level:  Lab Results  Component Value Date   ETH 128 (H) 10/10/2017    Metabolic Disorder Labs: Lab Results  Component Value Date   HGBA1C 5.5 10/11/2017   MPG 111.15 10/11/2017   Lab Results  Component Value Date   PROLACTIN 22.1 (H) 10/11/2017   Lab Results  Component Value Date   CHOL 217 (H) 10/11/2017   TRIG 423 (H) 10/11/2017   HDL 41 10/11/2017   CHOLHDL 5.3 10/11/2017   VLDL UNABLE TO CALCULATE IF TRIGLYCERIDE OVER 400 mg/dL 44/02/270   LDLCALC UNABLE TO CALCULATE IF TRIGLYCERIDE OVER 400 mg/dL 53/66/4403    Physical Findings: AIMS: Facial and Oral Movements Muscles of Facial Expression: None, normal Lips and Perioral Area: None, normal Jaw: None, normal Tongue: None, normal,Extremity Movements Upper (arms, wrists, hands, fingers): None, normal Lower (legs, knees, ankles, toes): None, normal, Trunk Movements Neck, shoulders, hips: None, normal, Overall Severity Severity of abnormal movements (highest score from questions above): None, normal Incapacitation due to abnormal movements:  None, normal Patient's awareness of abnormal movements (rate only patient's report): No Awareness, Dental Status Current problems with teeth and/or dentures?: No Does patient usually wear dentures?: No  CIWA:    COWS:     Musculoskeletal: Strength & Muscle Tone: within normal limits Gait & Station: normal Patient leans: Backward  Psychiatric Specialty Exam: Physical Exam  Nursing note and vitals reviewed.   Review of Systems  Constitutional: Negative for chills and fever.  Respiratory: Negative for cough and shortness of breath.   Cardiovascular: Negative for chest pain.  Gastrointestinal: Negative for abdominal pain, heartburn, nausea and vomiting.  Psychiatric/Behavioral: Positive for depression and hallucinations. Negative for suicidal ideas. The patient is nervous/anxious and has insomnia.     Blood pressure 112/79, pulse (!) 104, temperature 98.4 F (36.9 C), temperature source Oral, resp. rate 16, height 6\' 2"  (1.88 m), weight 73.5 kg, SpO2 100 %.Body mass index is 20.8 kg/m.  General Appearance: Casual and Fairly Groomed  Eye Contact:  Good  Speech:  Clear and Coherent and Normal Rate  Volume:  Normal  Mood:  Anxious and Depressed  Affect:  Appropriate, Congruent and Constricted  Thought Process:  Coherent and Goal Directed  Orientation:  Full (  Time, Place, and Person)  Thought Content:  Hallucinations: Auditory Visual  Suicidal Thoughts:  No  Homicidal Thoughts:  No  Memory:  Immediate;   Fair Recent;   Fair Remote;   Fair  Judgement:  Poor  Insight:  Lacking  Psychomotor Activity:  Normal  Concentration:  Concentration: Fair  Recall:  [employer] of Knowledge:  Fair  Language:  Fair  Akathisia:  No  Handed:    AIMS (if indicated):     Assets:  Resilience Social Support  ADL's:  Intact  Cognition:  WNL  Sleep:  Number of Hours: 5.25   Treatment Plan Summary: Daily contact with patient to assess and evaluate symptoms and progress in treatment and  Medication management   -Continue inpatient hospitalization  -Schizophrenia  -DC zyprexa   -Start haldol 5mg  po BID  -Anxiety   -Continue vistaril 50mg  po q6h prn anxiety  -Agitation/psychosis  -DC geodon/zydis   -Start haldol 5mg  po/IM q6h prn agitation/psychosis  -EPS   -Start cogentin 1mg  po BID prn EPS  -insomnia   -Change trazodone 50mg  po qhs to trazodone 100mg  po qhs prn insomnia (may repeat x1 prn insomnia)  -Encourage participation in groups and therapeutic milieu  -disposition planning will be ongoing  Micheal Likens, MD 10/12/2017, 2:37 PM

## 2017-10-12 NOTE — Progress Notes (Signed)
Recreation Therapy Notes  Date: 9.6.19 Time: 1000 Location: 500 [address] Dayroom   Group Topic: Communication, Team Building, Problem Solving  Goal Area(s) Addresses:  Patient will effectively work with peer towards shared goal.  Patient will identify skills used to make activity successful.  Patient will identify how skills used during activity can be used to reach post d/c goals.  Intervention: STEM Activity  Activity: Stage manager. In teams patients were given 12 plastic drinking straws and a length of masking tape. Using the materials provided patients were asked to build a landing pad to catch a golf ball dropped from approximately 6 feet in the air.   Education: Pharmacist, community, Discharge Planning   Education Outcome: Acknowledges education/In group clarification offered/Needs additional education.   Clinical Observations/Feedback: Pt did not attend group.    Caroll Rancher, LRT/CTRS    Caroll Rancher A 10/12/2017 12:24 PM

## 2017-10-13 DIAGNOSIS — G259 Extrapyramidal and movement disorder, unspecified: Secondary | ICD-10-CM

## 2017-10-13 DIAGNOSIS — F329 Major depressive disorder, single episode, unspecified: Secondary | ICD-10-CM

## 2017-10-13 MED ORDER — SERTRALINE HCL 25 MG PO TABS
25.0000 mg | ORAL_TABLET | Freq: Every day | ORAL | Status: DC
  Administered 2017-10-13 – 2017-10-14 (×2): 25 mg via ORAL
  Filled 2017-10-13 (×5): qty 1

## 2017-10-13 MED ORDER — SERTRALINE HCL 25 MG PO TABS: 25.0000 mg | ORAL_TABLET | Freq: Every day | ORAL | Status: DC

## 2017-10-13 NOTE — Progress Notes (Signed)
Patient shared in group that he had a good day since he went to the gym and played basketball with his peers. His goal for tomorrow is to focus on getting well. He would like to get "better" and take advantage of the help while he's here in the hospital.

## 2017-10-13 NOTE — [facility] Group Notes (Signed)
  [facility] LCSW Group Therapy Note  Date/Time:  10/13/2017 11:15AM-12:00PM  Type of Therapy and Topic:  Group Therapy:  Feelings About Hospitalization  Participation Level:  Active   Description of Group This process group involved patients discussing their feelings related to being hospitalized, as well as the benefits they see to being in the hospital.  These feelings and benefits were itemized.  The group then brainstormed specific ways in which they could seek those same benefits when they discharge and return home.  Therapeutic Goals 1. Patient will identify and describe positive and negative feelings related to hospitalization 2. Patient will verbalize benefits of hospitalization to themselves personally 3. Patients will brainstorm together ways they can obtain similar benefits in the outpatient setting, identify barriers to wellness and possible solutions  Summary of Patient Progress:  The patient expressed his primary feelings about being hospitalized are "glad to be able to seek help."  He stated that if he had not gotten some help he would have done something wrong that he could not take back.  He was not very interactive during group.  Therapeutic Modalities Cognitive Behavioral Therapy Motivational Interviewing    Ambrose Mantle, LCSW 10/13/2017, 8:35 AM

## 2017-10-13 NOTE — Progress Notes (Addendum)
[facility] MD Progress Note  10/13/2017 10:09 AM Edwin Henry  MRN:  [id number] Subjective:    Stormy seen standing at the nurses station taking medications.  He is awake alert and oriented x3.  Reports his depression "is really bad" rates it 10 out of 10 with 10 being the worst.  Discussed initiating antidepressant Zoloft 25 mg p.o. daily patient is agreeable to plan.  Reports auditory hallucinations denies voices are command in nature.  Reports intermittent thoughts of suicidal ideations patient is able to contract for safety while on the unit.  Reports a good appetite.  States he is resting well.  Presents with slight irritability related appears.  Support, encouragement and  reassurance was provided.  History: Edwin Henry is a 57 y/o M with history of schizophrenia who was admitted voluntarily from [facility] where he presented with worsening depression, SI without plan, AH, and VH in the context of being off of psychotropic medications for several years. Pt was medically cleared and then transferred to [facility] for additional treatment and stabilization. He was started on trial of zyprexa and observed on the inpatient unit.   Principal Problem: Schizophrenia, unspecified (HCC) Diagnosis:   Patient Active Problem List   Diagnosis Date Noted  . Schizophrenia, unspecified (HCC) [F20.9] 10/10/2017   Total Time spent with patient: 30 minutes  Past Psychiatric History: see H&P  Past Medical History: History reviewed. No pertinent past medical history. History reviewed. No pertinent surgical history. Family History: History reviewed. No pertinent family history. Family Psychiatric  History: see H&P Social History:  Social History   Substance and Sexual Activity  Alcohol Use Yes   Comment: 1-2 beers every 2 days     Social History   Substance and Sexual Activity  Drug Use Yes  . Types: Marijuana   Comment: occasional THC use    Social History   Socioeconomic History  . Marital status: Single     Spouse name: Not on file  . Number of children: Not on file  . Years of education: Not on file  . Highest education level: Not on file  Occupational History  . Not on file  Social Needs  . Financial resource strain: Not on file  . Food insecurity:    Worry: Not on file    Inability: Not on file  . Transportation needs:    Medical: Not on file    Non-medical: Not on file  Tobacco Use  . Smoking status: Current Every Day Smoker    Packs/day: 0.50  . Smokeless tobacco: Never Used  Substance and Sexual Activity  . Alcohol use: Yes    Comment: 1-2 beers every 2 days  . Drug use: Yes    Types: Marijuana    Comment: occasional THC use  . Sexual activity: Not on file  Lifestyle  . Physical activity:    Days per week: Not on file    Minutes per session: Not on file  . Stress: Not on file  Relationships  . Social connections:    Talks on phone: Not on file    Gets together: Not on file    Attends religious service: Not on file    Active member of club or organization: Not on file    Attends meetings of clubs or organizations: Not on file    Relationship status: Not on file  Other Topics Concern  . Not on file  Social History Narrative  . Not on file   Additional Social History:  Sleep: Poor  Appetite:  Good  Current Medications: Current Facility-Administered Medications  Medication Dose Route Frequency Provider Last Rate Last Dose  . acetaminophen (TYLENOL) tablet 650 mg  650 mg Oral Q6H PRN Nira Conn A, NP      . alum & mag hydroxide-simeth (MAALOX/MYLANTA) 200-200-20 MG/5ML suspension 30 mL  30 mL Oral Q4H PRN Nira Conn A, NP      . benztropine (COGENTIN) tablet 1 mg  1 mg Oral BID PRN Micheal Likens, MD   1 mg at 10/12/17 1716   Or  . benztropine mesylate (COGENTIN) injection 1 mg  1 mg Intramuscular BID PRN Micheal Likens, MD      . feeding supplement (ENSURE ENLIVE) (ENSURE ENLIVE) liquid 237 mL  1  Bottle Oral BID BM Micheal Likens, MD   237 mL at 10/13/17 0958  . haloperidol (HALDOL) tablet 5 mg  5 mg Oral BID Jolyne Loa T, MD   5 mg at 10/13/17 1610  . haloperidol (HALDOL) tablet 5 mg  5 mg Oral Q6H PRN Micheal Likens, MD   5 mg at 10/12/17 1716   Or  . haloperidol lactate (HALDOL) injection 5 mg  5 mg Intramuscular Q6H PRN Micheal Likens, MD      . hydrOXYzine (ATARAX/VISTARIL) tablet 50 mg  50 mg Oral Q6H PRN Micheal Likens, MD      . magnesium hydroxide (MILK OF MAGNESIA) suspension 30 mL  30 mL Oral Daily PRN Nira Conn A, NP      . multivitamin with minerals tablet 1 tablet  1 tablet Oral Daily Micheal Likens, MD   1 tablet at 10/13/17 1000  . nicotine (NICODERM CQ - dosed in mg/24 hours) patch 21 mg  21 mg Transdermal Daily Micheal Likens, MD   21 mg at 10/13/17 0959  . sertraline (ZOLOFT) tablet 25 mg  25 mg Oral Daily Oneta Rack, NP      . traZODone (DESYREL) tablet 100 mg  100 mg Oral QHS PRN,MR X 1 Oneta Rack, NP   100 mg at 10/13/17 0002    Lab Results:  No results found for this or any previous visit (from the past 48 hour(s)).  Blood Alcohol level:  Lab Results  Component Value Date   ETH 128 (H) 10/10/2017    Metabolic Disorder Labs: Lab Results  Component Value Date   HGBA1C 5.5 10/11/2017   MPG 111.15 10/11/2017   Lab Results  Component Value Date   PROLACTIN 22.1 (H) 10/11/2017   Lab Results  Component Value Date   CHOL 217 (H) 10/11/2017   TRIG 423 (H) 10/11/2017   HDL 41 10/11/2017   CHOLHDL 5.3 10/11/2017   VLDL UNABLE TO CALCULATE IF TRIGLYCERIDE OVER 400 mg/dL 96/05/5407   LDLCALC UNABLE TO CALCULATE IF TRIGLYCERIDE OVER 400 mg/dL 81/19/1478    Physical Findings: AIMS: Facial and Oral Movements Muscles of Facial Expression: None, normal Lips and Perioral Area: None, normal Jaw: None, normal Tongue: None, normal,Extremity Movements Upper (arms, wrists,  hands, fingers): None, normal Lower (legs, knees, ankles, toes): None, normal, Trunk Movements Neck, shoulders, hips: None, normal, Overall Severity Severity of abnormal movements (highest score from questions above): None, normal Incapacitation due to abnormal movements: None, normal Patient's awareness of abnormal movements (rate only patient's report): No Awareness, Dental Status Current problems with teeth and/or dentures?: No Does patient usually wear dentures?: No  CIWA:    COWS:     Musculoskeletal: Strength &  Muscle Tone: within normal limits Gait & Station: normal Patient leans: Backward  Psychiatric Specialty Exam: Physical Exam  Nursing note and vitals reviewed. Constitutional: He is oriented to person, place, and time. He appears well-developed.  Neurological: He is alert and oriented to person, place, and time.  Psychiatric: He has a normal mood and affect. His behavior is normal.    Review of Systems  Psychiatric/Behavioral: Positive for depression and hallucinations. Negative for suicidal ideas. The patient is nervous/anxious and has insomnia.   All other systems reviewed and are negative.   Blood pressure 112/79, pulse (!) 104, temperature 98.4 F (36.9 C), temperature source Oral, resp. rate 16, height 6\' 2"  (1.88 m), weight 73.5 kg, SpO2 100 %.Body mass index is 20.8 kg/m.  General Appearance: Casual and Fairly Groomed  Eye Contact:  Good  Speech:  Clear and Coherent and Normal Rate  Volume:  Normal  Mood:  Anxious and Depressed  Affect:  Appropriate, Congruent and Constricted  Thought Process:  Coherent and Goal Directed  Orientation:  Full (Time, Place, and Person)  Thought Content:  Hallucinations: Auditory Visual  Suicidal Thoughts:  No  Homicidal Thoughts:  No  Memory:  Immediate;   Fair Recent;   Fair Remote;   Fair  Judgement:  Poor  Insight:  Lacking  Psychomotor Activity:  Normal  Concentration:  Concentration: Fair  Recall:  [employer] of  Knowledge:  Fair  Language:  Fair  Akathisia:  No  Handed:    AIMS (if indicated):     Assets:  Resilience Social Support  ADL's:  Intact  Cognition:  WNL  Sleep:  Number of Hours: 5.25   Treatment Plan Summary: Daily contact with patient to assess and evaluate symptoms and progress in treatment and Medication management   Continue current treatment plan 10/13/2017 as listed below except were noted  -Schizophrenia  -DC zyprexa   -Continue  haldol 5mg  po BID  - Depression  Initiate Zoloft 25 mg p.o. Daily (with titration)  -Anxiety   -Continue vistaril 50mg  po q6h prn anxiety  -Agitation/psychosis  -DC geodon/zydis   -Continue Haldol 5mg  po/IM q6h prn agitation/psychosis  -EPS   -Continue Cogentin 1mg  po BID prn EPS  -insomnia   -Continue trazodone 100mg  po qhs prn insomnia (may repeat x1 prn insomnia)  -Encourage participation in groups and therapeutic milieu -disposition planning will be ongoing  Oneta Rack, NP 10/13/2017, 10:09 AM   ..Agree with NP Progress Note

## 2017-10-13 NOTE — [facility] Group Notes (Signed)
[facility] Group Notes:  (Nursing/MHT/Case Management/Adjunct)  Date:  10/13/2017  Time:  3:03 PM  Type of Therapy:  Nurse Education  Participation Level:  Active  Participation Quality:  Attentive  Affect:  Appropriate  Cognitive:  Appropriate  Insight:  Appropriate  Engagement in Group:  Engaged  Modes of Intervention:  Discussion, Education and Support  Summary of Progress/Problems: Pt was attentive during discussion of sleep hygiene.    Maurine Simmering 10/13/2017, 3:03 PM

## 2017-10-13 NOTE — Progress Notes (Signed)
D: Darrin denied SI/HI/VH this morning but did endorse some AH. He was started on sertraline this a.m and appeared glad to do so. He has been out in the milieu, cooperative with medications, and pleasant. On his self inventory, he rated his depression 8/10 (ten being worst), hopelessness 8/10, and anxiety 1/10. He also reported fair sleep, fair appetite, low energy level, and poor concentration.   A: Meds given as ordered. No PRNs requested or given. Q15 safety checks maintained. Support/encouragement offered.  R: Pt remains free from harm and continues with treatment. Will continue to monitor for needs/safety.

## 2017-10-13 NOTE — Progress Notes (Signed)
Patient ID: Edwin Henry, male   DOB: 11/23/1960, 57 y.o.   MRN: [id number] DAR Note: Pt continue to endorse AVH; denies command hallucinations. Pt endorse moderate anxiety. Pt continue to show some anxiety about sleeping. Pt denies SI, HI, or pain. Pt was med compliant. All patient's questions and concerns addressed. Support, encouragement, and safe environment provided. 15-minute safety checks continue.

## 2017-10-14 MED ORDER — SERTRALINE HCL 50 MG PO TABS
50.0000 mg | ORAL_TABLET | Freq: Every day | ORAL | Status: DC
  Administered 2017-10-15 – 2017-10-19 (×5): 50 mg via ORAL
  Filled 2017-10-14 (×8): qty 1

## 2017-10-14 MED ORDER — TRAZODONE HCL 100 MG PO TABS
100.0000 mg | ORAL_TABLET | Freq: Every day | ORAL | Status: DC
  Administered 2017-10-14 – 2017-10-18 (×5): 100 mg via ORAL
  Filled 2017-10-14 (×8): qty 1

## 2017-10-14 NOTE — [facility] Suicide Risk Assessment (Signed)
[facility] INPATIENT:  Family/Significant Other Suicide Prevention Education  Suicide Prevention Education:  Contact Attempts: mother Edwin Henry  [phone] has been identified by the patient as the family member/significant other with whom the patient will be residing, and identified as the person(s) who will aid the patient in the event of a mental health crisis.  With written consent from the patient, two attempts were made to provide suicide prevention education, prior to and/or following the patient's discharge.  We were unsuccessful in providing suicide prevention education.  A suicide education pamphlet was given to the patient to share with family/significant other.  Date and time of first attempt:  10/14/2017  /  2:07 PM   - no voicemail, could not leave message Date and time of second attempt:   To be done  Edwin Henry 10/14/2017, 2:06 PM

## 2017-10-14 NOTE — Progress Notes (Signed)
D: Pt passive SI/ AVH- non- command  , pt contracts for safety. Pt is pleasant and cooperative. Pt kept to himself most of the evening, pt said he was doing better after moving to a room to himself A: Pt was offered support and encouragement. Pt was given scheduled medications. Pt was encourage to attend groups. Q 15 minute checks were done for safety.  R:Pt attends groups and interacts well with peers and staff. Pt is taking medication. Pt has no complaints.Pt receptive to treatment and safety maintained on unit.  Problem: Activity: Goal: Sleeping patterns will improve Outcome: Progressing   Problem: Education: Goal: Mental status will improve Outcome: Progressing   Problem: Education: Goal: Emotional status will improve Outcome: Progressing

## 2017-10-14 NOTE — Progress Notes (Addendum)
[facility] MD Progress Note  10/14/2017 11:38 AM Edwin Henry  MRN:  [id number] Subjective:    Edwin Henry observed sitting in a day room interacting with peers.  He is awake alert and oriented x3.  Continues to report auditory hallucinations.  Reports hearing voices states he feels medication is helping to minimize the voices.  Denies suicidal homicidal ideations.  Reports his depression is a 8 out of 10 with 10 being the worst.  Patient was recently started on Zoloft 25 mg.  Reports tolerating medications well will titrate Zoloft to 50 p.o. daily.  Reports a poor appetite.  Reports he feels the medication at night is making him groggy in the morning and is requesting to take the medication earlier time.  Time adjustment to trazodone to 20001 repeat.  Support encouragement reassurance was provided  History: Edwin Henry is a 58 y/o M with history of schizophrenia who was admitted voluntarily from [facility] where he presented with worsening depression, SI without plan, AH, and VH in the context of being off of psychotropic medications for several years. Pt was medically cleared and then transferred to [facility] for additional treatment and stabilization. He was started on trial of zyprexa and observed on the inpatient unit.   Principal Problem: Schizophrenia, unspecified (HCC) Diagnosis:   Patient Active Problem List   Diagnosis Date Noted  . Schizophrenia, unspecified (HCC) [F20.9] 10/10/2017   Total Time spent with patient: 30 minutes  Past Psychiatric History: see H&P  Past Medical History: History reviewed. No pertinent past medical history. History reviewed. No pertinent surgical history. Family History: History reviewed. No pertinent family history. Family Psychiatric  History: see H&P Social History:  Social History   Substance and Sexual Activity  Alcohol Use Yes   Comment: 1-2 beers every 2 days     Social History   Substance and Sexual Activity  Drug Use Yes  . Types: Marijuana   Comment:  occasional THC use    Social History   Socioeconomic History  . Marital status: Single    Spouse name: Not on file  . Number of children: Not on file  . Years of education: Not on file  . Highest education level: Not on file  Occupational History  . Not on file  Social Needs  . Financial resource strain: Not on file  . Food insecurity:    Worry: Not on file    Inability: Not on file  . Transportation needs:    Medical: Not on file    Non-medical: Not on file  Tobacco Use  . Smoking status: Current Every Day Smoker    Packs/day: 0.50  . Smokeless tobacco: Never Used  Substance and Sexual Activity  . Alcohol use: Yes    Comment: 1-2 beers every 2 days  . Drug use: Yes    Types: Marijuana    Comment: occasional THC use  . Sexual activity: Not on file  Lifestyle  . Physical activity:    Days per week: Not on file    Minutes per session: Not on file  . Stress: Not on file  Relationships  . Social connections:    Talks on phone: Not on file    Gets together: Not on file    Attends religious service: Not on file    Active member of club or organization: Not on file    Attends meetings of clubs or organizations: Not on file    Relationship status: Not on file  Other Topics Concern  . Not on  file  Social History Narrative  . Not on file   Additional Social History:                         Sleep: Poor  Appetite:  Good  Current Medications: Current Facility-Administered Medications  Medication Dose Route Frequency Provider Last Rate Last Dose  . acetaminophen (TYLENOL) tablet 650 mg  650 mg Oral Q6H PRN Nira Conn A, NP      . alum & mag hydroxide-simeth (MAALOX/MYLANTA) 200-200-20 MG/5ML suspension 30 mL  30 mL Oral Q4H PRN Nira Conn A, NP      . benztropine (COGENTIN) tablet 1 mg  1 mg Oral BID PRN Micheal Likens, MD   1 mg at 10/12/17 1716   Or  . benztropine mesylate (COGENTIN) injection 1 mg  1 mg Intramuscular BID PRN Micheal Likens, MD      . feeding supplement (ENSURE ENLIVE) (ENSURE ENLIVE) liquid 237 mL  1 Bottle Oral BID BM Micheal Likens, MD   237 mL at 10/14/17 1610  . haloperidol (HALDOL) tablet 5 mg  5 mg Oral BID Jolyne Loa T, MD   5 mg at 10/14/17 9604  . haloperidol (HALDOL) tablet 5 mg  5 mg Oral Q6H PRN Micheal Likens, MD   5 mg at 10/12/17 1716   Or  . haloperidol lactate (HALDOL) injection 5 mg  5 mg Intramuscular Q6H PRN Micheal Likens, MD      . hydrOXYzine (ATARAX/VISTARIL) tablet 50 mg  50 mg Oral Q6H PRN Micheal Likens, MD      . magnesium hydroxide (MILK OF MAGNESIA) suspension 30 mL  30 mL Oral Daily PRN Nira Conn A, NP      . multivitamin with minerals tablet 1 tablet  1 tablet Oral Daily Micheal Likens, MD   1 tablet at 10/14/17 [phone]  . nicotine (NICODERM CQ - dosed in mg/24 hours) patch 21 mg  21 mg Transdermal Daily Micheal Likens, MD   21 mg at 10/14/17 8119  . sertraline (ZOLOFT) tablet 25 mg  25 mg Oral Daily Oneta Rack, NP   25 mg at 10/14/17 1478  . traZODone (DESYREL) tablet 100 mg  100 mg Oral QHS Oneta Rack, NP        Lab Results:  No results found for this or any previous visit (from the past 48 hour(s)).  Blood Alcohol level:  Lab Results  Component Value Date   ETH 128 (H) 10/10/2017    Metabolic Disorder Labs: Lab Results  Component Value Date   HGBA1C 5.5 10/11/2017   MPG 111.15 10/11/2017   Lab Results  Component Value Date   PROLACTIN 22.1 (H) 10/11/2017   Lab Results  Component Value Date   CHOL 217 (H) 10/11/2017   TRIG 423 (H) 10/11/2017   HDL 41 10/11/2017   CHOLHDL 5.3 10/11/2017   VLDL UNABLE TO CALCULATE IF TRIGLYCERIDE OVER 400 mg/dL 29/56/2130   LDLCALC UNABLE TO CALCULATE IF TRIGLYCERIDE OVER 400 mg/dL 86/57/8469    Physical Findings: AIMS: Facial and Oral Movements Muscles of Facial Expression: None, normal Lips and Perioral Area: None, normal Jaw:  None, normal Tongue: None, normal,Extremity Movements Upper (arms, wrists, hands, fingers): None, normal Lower (legs, knees, ankles, toes): None, normal, Trunk Movements Neck, shoulders, hips: None, normal, Overall Severity Severity of abnormal movements (highest score from questions above): None, normal Incapacitation due to abnormal movements: None, normal Patient's awareness of  abnormal movements (rate only patient's report): No Awareness, Dental Status Current problems with teeth and/or dentures?: No Does patient usually wear dentures?: No  CIWA:    COWS:     Musculoskeletal: Strength & Muscle Tone: within normal limits Gait & Station: normal Patient leans: Backward  Psychiatric Specialty Exam: Physical Exam  Nursing note and vitals reviewed. Constitutional: He is oriented to person, place, and time. He appears well-developed.  Neurological: He is alert and oriented to person, place, and time.  Psychiatric: He has a normal mood and affect. His behavior is normal.    Review of Systems  Psychiatric/Behavioral: Positive for depression and hallucinations. Negative for suicidal ideas. The patient is nervous/anxious and has insomnia.   All other systems reviewed and are negative.   Blood pressure 103/67, pulse (!) 101, temperature 98.3 F (36.8 C), temperature source Oral, resp. rate 16, height 6\' 2"  (1.88 m), weight 73.5 kg, SpO2 100 %.Body mass index is 20.8 kg/m.  General Appearance: Casual and Fairly Groomed  Eye Contact:  Good  Speech:  Clear and Coherent and Normal Rate  Volume:  Normal  Mood:  Anxious and Depressed  Affect:  Appropriate, Congruent and Constricted  Thought Process:  Coherent and Goal Directed  Orientation:  Full (Time, Place, and Person)  Thought Content:  Hallucinations: Auditory Visual  Suicidal Thoughts:  No  Homicidal Thoughts:  No  Memory:  Immediate;   Fair Recent;   Fair Remote;   Fair  Judgement:  Poor  Insight:  Lacking  Psychomotor  Activity:  Normal  Concentration:  Concentration: Fair  Recall:  [employer] of Knowledge:  Fair  Language:  Fair  Akathisia:  No  Handed:    AIMS (if indicated):     Assets:  Resilience Social Support  ADL's:  Intact  Cognition:  WNL  Sleep:  Number of Hours: 6.5   Treatment Plan Summary: Daily contact with patient to assess and evaluate symptoms and progress in treatment and Medication management   Continue current treatment plan 10/14/2017 as listed below except were noted  -Schizophrenia  -DC zyprexa   -Continue  haldol 5mg  po BID  - Depression  Increased Zoloft 25 mg p.o to 50mg  Daily (with titration)  -Anxiety   -Continue vistaril 50mg  po q6h prn anxiety  -Agitation/psychosis  -DC geodon/zydis   -Continue Haldol 5mg  po/IM q6h prn agitation/psychosis  -EPS   -Continue Cogentin 1mg  po BID prn EPS  -insomnia   -Continue trazodone 100mg  po qhs prn insomnia (may repeat x1 prn insomnia)  -Encourage participation in groups and therapeutic milieu -disposition planning will be ongoing  Oneta Rack, NP 10/14/2017, 11:38 AM    .Agree with NP Progress Note

## 2017-10-14 NOTE — Progress Notes (Signed)
Patient ID: Edwin Henry, male   DOB: 09-Dec-1960, 57 y.o.   MRN: [id number]     D: Pt has been flat and depressed on the unit today. Pt reported that one of his major stressors is not being able to talk to his mother. Per the SW staff allowed pt to get his mothers number out of his phone. Pt reported that he felt much better after talking to his mom. Pt did get upset with his roommate and reported that his roommate was nasty and that he could not take it anymore. Pt was moved to another room, pt reported that he was not trying to start any trouble he just knew that he had reached his breaking point. Pt reported that his depression was a 8, his hopelessness was a 6, and his anxiety was a 8. Pt reported that his goal for today was to get everything together. Pt reported that  Pt reported being negative SI/HI, no AH/VH noted. A: 15 min checks continued for patient safety. R: Pt safety maintained.

## 2017-10-14 NOTE — [facility] Group Notes (Signed)
Adult Psychoeducational Group Note  Date:  10/14/2017 Time:  8:52 PM  Group Topic/Focus:  Wrap-Up Group:   The focus of this group is to help patients review their daily goal of treatment and discuss progress on daily workbooks.  Participation Level:  Minimal  Participation Quality:  Resistant  Affect:  Blunted and Irritable  Cognitive:  Alert and Appropriate  Insight: Lacking   Engagement in Group:  Poor  Modes of Intervention:  Discussion and Education  Additional Comments:  Pt attended and participated in wrap up group this evening. Pt rated their day a 5/10 because "it's just been another day". Pt goal was to "get a hold of themselves".   Chrisandra Netters 10/14/2017, 8:52 PM

## 2017-10-14 NOTE — Progress Notes (Addendum)
Patient ID: Edwin Henry, male   DOB: [birth year], 57 y.o.   MRN: [id number] DAR Note: Pt continue to endorse moderate anxiety and AVH; denies command hallucinations. Pt denies SI, HI, or pain. Pt was med compliant. All patient's questions and concerns addressed. Support, encouragement, and safe environment provided. 15-minute safety checks continue.  Pt did attend wrap-up group.

## 2017-10-14 NOTE — [facility] Group Notes (Signed)
[facility] LCSW Group Therapy Note  Date/Time:  10/14/2017  11:00AM-12:00PM  Type of Therapy and Topic:  Group Therapy:  Music and Mood  Participation Level:  Did Not Attend   Description of Group: In this process group, members listened to a variety of genres of music and identified that different types of music evoke different responses.  Patients were encouraged to identify music that was soothing for them and music that was energizing for them.  Patients discussed how this knowledge can help with wellness and recovery in various ways including managing depression and anxiety as well as encouraging healthy sleep habits.    Therapeutic Goals: 1. Patients will explore the impact of different varieties of music on mood 2. Patients will verbalize the thoughts they have when listening to different types of music 3. Patients will identify music that is soothing to them as well as music that is energizing to them 4. Patients will discuss how to use this knowledge to assist in maintaining wellness and recovery 5. Patients will explore the use of music as a coping skill  Summary of Patient Progress:  N/A  Therapeutic Modalities: Solution Focused Brief Therapy Activity   Ambrose Mantle, LCSW

## 2017-10-15 MED ORDER — HALOPERIDOL 5 MG PO TABS
10.0000 mg | ORAL_TABLET | Freq: Every day | ORAL | Status: DC
  Administered 2017-10-15 – 2017-10-18 (×4): 10 mg via ORAL
  Filled 2017-10-15 (×6): qty 2

## 2017-10-15 MED ORDER — HALOPERIDOL 5 MG PO TABS
5.0000 mg | ORAL_TABLET | ORAL | Status: DC
  Administered 2017-10-16 – 2017-10-19 (×4): 5 mg via ORAL
  Filled 2017-10-15 (×6): qty 1

## 2017-10-15 NOTE — Progress Notes (Signed)
D: Pt remains guarded and isolative to his room majority of this shift. OOB only for meals and medications. Presents with flat affect and irritable mood. Denies SI, VH HI and pain. Endorsed AH "the voices are still telling me negative / demeaning things and to kill myself". Pt did not attend unit groups despite multiple prompts. Agitated when offered self inventory sheet this morning "I don't need it, just give my medicines". A: Emotional support and availability provided to pt as needed. Encouraged pt to voice concerns, attend to ADLs and comply with current treatment regimen. Scheduled medications given with verbal education and effects monitored. Safety checks maintained without self harm gestures to note thus far. R: Pt receptive to care. Denies concerns at this time.POC continues for safety and mood stability.

## 2017-10-15 NOTE — Progress Notes (Signed)
  D: Pt +ve AVH- getting better due to medications. Pt is pleasant and cooperative. Pt visible on the unit some of the evening.  A: Pt was offered support and encouragement. Pt was given scheduled medications. Pt was encourage to attend groups. Q 15 minute checks were done for safety.  R:Pt attends groups and interacts  with peers and staff. Pt is taking medication. Pt has no complaints.Pt receptive to treatment and safety maintained on unit.  Problem: Education: Goal: Mental status will improve Outcome: Progressing   Problem: Activity: Goal: Sleeping patterns will improve Outcome: Progressing   Problem: Coping: Goal: Ability to verbalize frustrations and anger appropriately will improve Outcome: Progressing

## 2017-10-15 NOTE — Progress Notes (Signed)
Adult Psychoeducational Group Note  Date:  10/15/2017 Time:  8:34 PM  Group Topic/Focus:  Wrap-Up Group:   The focus of this group is to help patients review their daily goal of treatment and discuss progress on daily workbooks.  Participation Level:  Active  Participation Quality:  Appropriate  Affect:  Appropriate  Cognitive:  Appropriate  Insight: Appropriate  Engagement in Group:  Engaged  Modes of Intervention:  Discussion  Additional Comments: The patient expressed that he attended groups.The patient also said that he rates today a 7.  Octavio Manns 10/15/2017, 8:34 PM

## 2017-10-15 NOTE — Progress Notes (Signed)
[facility] MD Progress Note  10/15/2017 1:53 PM Edwin Henry  MRN:  [id number] Subjective:    Edwin Henry is a 57 y/o M with history of schizophrenia who was admitted voluntarily from [facility] where he presented with worsening depression, SI without plan, AH, and VH in the context of being off of psychotropic medications for several years. Pt was medically cleared and then transferred to [facility] for additional treatment and stabilization. He was started on trial of zyprexa and observed on the inpatient unit. He was also started on trial of zoloft for depression. He has some gradual improvement of his presenting symptoms.  Today upon evaluation, pt shares, "I'm okay  - still hearing those voices" Pt reports ongoing AH that say demeaning things and "We'll get you when you get out." He endorses VH of seeing "a demon on the wall" last evening. He denies SI/HI. He is sleeping well. His appetite is good. He is tolerating his medications well, and he feels that they have been helpful. He is in agreement to increase evening dose of haldol and to continue his other medications without changes. Pt was in agreement with the above plan. He had no further questions, comments, or concerns.  Principal Problem: Schizophrenia, unspecified (HCC) Diagnosis:   Patient Active Problem List   Diagnosis Date Noted  . Schizophrenia, unspecified (HCC) [F20.9] 10/10/2017   Total Time spent with patient: 30 minutes  Past Psychiatric History: see H&P  Past Medical History: History reviewed. No pertinent past medical history. History reviewed. No pertinent surgical history. Family History: History reviewed. No pertinent family history. Family Psychiatric  History: see H&P Social History:  Social History   Substance and Sexual Activity  Alcohol Use Yes   Comment: 1-2 beers every 2 days     Social History   Substance and Sexual Activity  Drug Use Yes  . Types: Marijuana   Comment: occasional THC use    Social History    Socioeconomic History  . Marital status: Single    Spouse name: Not on file  . Number of children: Not on file  . Years of education: Not on file  . Highest education level: Not on file  Occupational History  . Not on file  Social Needs  . Financial resource strain: Not on file  . Food insecurity:    Worry: Not on file    Inability: Not on file  . Transportation needs:    Medical: Not on file    Non-medical: Not on file  Tobacco Use  . Smoking status: Current Every Day Smoker    Packs/day: 0.50  . Smokeless tobacco: Never Used  Substance and Sexual Activity  . Alcohol use: Yes    Comment: 1-2 beers every 2 days  . Drug use: Yes    Types: Marijuana    Comment: occasional THC use  . Sexual activity: Not on file  Lifestyle  . Physical activity:    Days per week: Not on file    Minutes per session: Not on file  . Stress: Not on file  Relationships  . Social connections:    Talks on phone: Not on file    Gets together: Not on file    Attends religious service: Not on file    Active member of club or organization: Not on file    Attends meetings of clubs or organizations: Not on file    Relationship status: Not on file  Other Topics Concern  . Not on file  Social History Narrative  .  Not on file   Additional Social History:                         Sleep: Good  Appetite:  Good  Current Medications: Current Facility-Administered Medications  Medication Dose Route Frequency Provider Last Rate Last Dose  . acetaminophen (TYLENOL) tablet 650 mg  650 mg Oral Q6H PRN Nira Conn A, NP      . alum & mag hydroxide-simeth (MAALOX/MYLANTA) 200-200-20 MG/5ML suspension 30 mL  30 mL Oral Q4H PRN Nira Conn A, NP      . benztropine (COGENTIN) tablet 1 mg  1 mg Oral BID PRN Micheal Likens, MD   1 mg at 10/12/17 1716   Or  . benztropine mesylate (COGENTIN) injection 1 mg  1 mg Intramuscular BID PRN Micheal Likens, MD      . feeding supplement  (ENSURE ENLIVE) (ENSURE ENLIVE) liquid 237 mL  1 Bottle Oral BID BM Micheal Likens, MD   237 mL at 10/15/17 1328  . [START ON 10/16/2017] haloperidol (HALDOL) tablet 5 mg  5 mg Oral BH-q7a Micheal Likens, MD       And  . haloperidol (HALDOL) tablet 10 mg  10 mg Oral QHS Jolyne Loa T, MD      . haloperidol (HALDOL) tablet 5 mg  5 mg Oral Q6H PRN Micheal Likens, MD   5 mg at 10/15/17 1330   Or  . haloperidol lactate (HALDOL) injection 5 mg  5 mg Intramuscular Q6H PRN Micheal Likens, MD      . hydrOXYzine (ATARAX/VISTARIL) tablet 50 mg  50 mg Oral Q6H PRN Micheal Likens, MD   50 mg at 10/14/17 1951  . magnesium hydroxide (MILK OF MAGNESIA) suspension 30 mL  30 mL Oral Daily PRN Nira Conn A, NP      . multivitamin with minerals tablet 1 tablet  1 tablet Oral Daily Micheal Likens, MD   1 tablet at 10/15/17 0813  . nicotine (NICODERM CQ - dosed in mg/24 hours) patch 21 mg  21 mg Transdermal Daily Micheal Likens, MD   21 mg at 10/14/17 0981  . sertraline (ZOLOFT) tablet 50 mg  50 mg Oral Daily Oneta Rack, NP   50 mg at 10/15/17 0813  . traZODone (DESYREL) tablet 100 mg  100 mg Oral QHS Oneta Rack, NP   100 mg at 10/14/17 1949    Lab Results: No results found for this or any previous visit (from the past 48 hour(s)).  Blood Alcohol level:  Lab Results  Component Value Date   ETH 128 (H) 10/10/2017    Metabolic Disorder Labs: Lab Results  Component Value Date   HGBA1C 5.5 10/11/2017   MPG 111.15 10/11/2017   Lab Results  Component Value Date   PROLACTIN 22.1 (H) 10/11/2017   Lab Results  Component Value Date   CHOL 217 (H) 10/11/2017   TRIG 423 (H) 10/11/2017   HDL 41 10/11/2017   CHOLHDL 5.3 10/11/2017   VLDL UNABLE TO CALCULATE IF TRIGLYCERIDE OVER 400 mg/dL 19/14/7829   LDLCALC UNABLE TO CALCULATE IF TRIGLYCERIDE OVER 400 mg/dL 56/21/3086    Physical Findings: AIMS: Facial and Oral  Movements Muscles of Facial Expression: None, normal Lips and Perioral Area: None, normal Jaw: None, normal Tongue: None, normal,Extremity Movements Upper (arms, wrists, hands, fingers): None, normal Lower (legs, knees, ankles, toes): None, normal, Trunk Movements Neck, shoulders, hips: None, normal, Overall Severity  Severity of abnormal movements (highest score from questions above): None, normal Incapacitation due to abnormal movements: None, normal Patient's awareness of abnormal movements (rate only patient's report): No Awareness, Dental Status Current problems with teeth and/or dentures?: No Does patient usually wear dentures?: No  CIWA:    COWS:     Musculoskeletal: Strength & Muscle Tone: within normal limits Gait & Station: normal Patient leans: N/A  Psychiatric Specialty Exam: Physical Exam  Nursing note and vitals reviewed.   Review of Systems  Constitutional: Negative for chills and fever.  Respiratory: Negative for cough and shortness of breath.   Cardiovascular: Negative for chest pain.  Gastrointestinal: Negative for abdominal pain, heartburn, nausea and vomiting.  Psychiatric/Behavioral: Negative for depression, hallucinations and suicidal ideas. The patient is not nervous/anxious and does not have insomnia.     Blood pressure 99/74, pulse 96, temperature 97.9 F (36.6 C), resp. rate 18, height 6\' 2"  (1.88 m), weight 73.5 kg, SpO2 100 %.Body mass index is 20.8 kg/m.  General Appearance: Casual and Fairly Groomed  Eye Contact:  Good  Speech:  Clear and Coherent and Normal Rate  Volume:  Normal  Mood:  Anxious  Affect:  Congruent, Constricted, Depressed and Flat  Thought Process:  Coherent and Goal Directed  Orientation:  Full (Time, Place, and Person)  Thought Content:  Hallucinations: Auditory Visual  Suicidal Thoughts:  No  Homicidal Thoughts:  No  Memory:  Immediate;   Good Recent;   Good Remote;   Good  Judgement:  Good  Insight:  Fair   Psychomotor Activity:  Normal  Concentration:  Concentration: Fair  Recall:  Fair  Fund of Knowledge:  Fair  Language:  Fair  Akathisia:  No  Handed:    AIMS (if indicated):     Assets:  Leisure Time Physical Health Resilience Social Support  ADL's:  Intact  Cognition:  WNL  Sleep:  Number of Hours: 6.25   Treatment Plan Summary: Daily contact with patient to assess and evaluate symptoms and progress in treatment and Medication management  -Continue inpatient hospitalization  -Schizophrenia             -Change haldol 5mg  po BID to haldol 5mg  po qAM + 10mg  po qhs   -Continue zoloft 50mg  po qDay  -Anxiety             -Continue vistaril 50mg  po q6h prn anxiety  -Agitation/psychosis            -Continue haldol 5mg  po/IM q6h prn agitation/psychosis  -EPS             -Continue cogentin 1mg  po BID prn EPS  -insomnia             -Continue trazodone 100mg  po qhs  -Encourage participation in groups and therapeutic milieu  -disposition planning will be ongoing  Micheal Likens, MD 10/15/2017, 1:53 PM

## 2017-10-15 NOTE — Progress Notes (Signed)
Recreation Therapy Notes   Date: 9.9.19 Time: 1000 Location: 500 [address] Dayroom  Group Topic: Goal Setting  Goal Area(s) Addresses:  Patient will successfully set at least 3 goals for their future during group.  Patient will successfully identify benefit of setting goals.    Intervention: Worksheet, pencils  Activity: Goal Setting.  Patients were given worksheets that were broken into 6 categories (family, friends, work/school, body, mental health and spirituality).  Patients were to identify what they are doing well, what they want to improve and set a goal of how to make that improvement.  Patients will then share their top 3 goals with the group.    Education Outcome:  Acknowledges education In group clarification offered Needs additional education  Clinical Observations/Feedback: Pt did not attend group.    Caroll Rancher, LRT/CTRS      Lillia Abed, Maribella Kuna A 10/15/2017 1:26 PM

## 2017-10-16 DIAGNOSIS — F200 Paranoid schizophrenia: Secondary | ICD-10-CM

## 2017-10-16 MED ORDER — BENZTROPINE MESYLATE 1 MG PO TABS
1.0000 mg | ORAL_TABLET | Freq: Two times a day (BID) | ORAL | Status: DC
  Administered 2017-10-16 – 2017-10-19 (×7): 1 mg via ORAL
  Filled 2017-10-16 (×12): qty 1

## 2017-10-16 NOTE — Progress Notes (Signed)
Adult Psychoeducational Group Note  Date:  10/16/2017 Time:  8:36 PM  Group Topic/Focus:  Wrap-Up Group:   The focus of this group is to help patients review their daily goal of treatment and discuss progress on daily workbooks.  Participation Level:  Active  Participation Quality:  Appropriate  Affect:  Appropriate  Cognitive:  Appropriate  Insight: Appropriate  Engagement in Group:  Engaged  Modes of Intervention:  Discussion  Additional Comments:  The patient expressed that he rates today a 6.The patient also said that he attended groups  Octavio Manns 10/16/2017, 8:36 PM

## 2017-10-16 NOTE — Progress Notes (Signed)
Patient denies SI, HI and reports a decrease in auditory hallucinations.  Patient has been isolative to room but compliant with medications.   Assess patient for safety, offer medications as prescribed, engage patient in 1:1 staff talks.   Continue to monitor as planned.  Patient able to contract with safety.

## 2017-10-16 NOTE — [facility] Group Notes (Signed)
LCSW Group Therapy Note   10/16/2017 1:15pm   Type of Therapy and Topic:  Group Therapy:  Overcoming Obstacles   Participation Level:  None   Description of Group:    In this group patients will be encouraged to explore what they see as obstacles to their own wellness and recovery. They will be guided to discuss their thoughts, feelings, and behaviors related to these obstacles. The group will process together ways to cope with barriers, with attention given to specific choices patients can make. Each patient will be challenged to identify changes they are motivated to make in order to overcome their obstacles. This group will be process-oriented, with patients participating in exploration of their own experiences as well as giving and receiving support and challenge from other group members.   Therapeutic Goals: 1. Patient will identify personal and current obstacles as they relate to admission. 2. Patient will identify barriers that currently interfere with their wellness or overcoming obstacles.  3. Patient will identify feelings, thought process and behaviors related to these barriers. 4. Patient will identify two changes they are willing to make to overcome these obstacles:      Summary of Patient Progress   Was in attendence for the first 10 minutes.  Contributed nothing.  Left and did not return.   Therapeutic Modalities:   Cognitive Behavioral Therapy Solution Focused Therapy Motivational Interviewing Relapse Prevention Therapy  [name], LCSW 10/16/2017 2:24 PM

## 2017-10-16 NOTE — Plan of Care (Signed)
Progress Note  D: pt found in the dayroom; pt compliant with medication administration. Pt denies any physical pain to this Clinical research associate. Pt denies any si/hi/ah/vh and verbally agrees to approach staff if these become apparent.  A: pt provided support and encouragement. Pt given medications per protocol and standing orders. Q56m safety checks implemented and continued. R: pt safe on the unit. Will continue to monitor.  Pt progressing in the following metrics  Problem: Education: Goal: Emotional status will improve Outcome: Progressing Goal: Mental status will improve Outcome: Progressing Goal: Verbalization of understanding the information provided will improve Outcome: Progressing   Problem: Activity: Goal: Interest or engagement in activities will improve Outcome: Progressing Goal: Sleeping patterns will improve Outcome: Progressing   Problem: Coping: Goal: Ability to verbalize frustrations and anger appropriately will improve Outcome: Progressing Goal: Ability to demonstrate self-control will improve Outcome: Progressing   Problem: Health Behavior/Discharge Planning: Goal: Compliance with treatment plan for underlying cause of condition will improve Outcome: Progressing

## 2017-10-16 NOTE — Progress Notes (Signed)
[facility] MD Progress Note  10/16/2017 1:49 PM Edwin Henry  MRN:  [id number]  Subjective: Edwin Henry reports, "I came to this hospital because I was hearing voices. The voices are still going on. They have improved some, but not much. When the voices are going on, I normally will lower my head to help me offset on the negative comments the voices were making against. The medicines are helping. I can begin to sleep at night now more than I was doing prior to coming here. The medicines are not helping the voices. I'm depressed about it. Both my hands are shaky. I have to hold my cup with both my hands to avoid spilling the liquid in it. I'm seeing demons as well".  Edwin Henry is a 57 y/o M with history of schizophrenia who was admitted voluntarily from [facility] where he presented with worsening depression, SI without plan, AH, and VH in the context of being off of psychotropic medications for several years. Pt was medically cleared and then transferred to [facility] for additional treatment and stabilization. He was started on trial of zyprexa and observed on the inpatient unit. He was also started on trial of zoloft for depression. He has some gradual improvement of his presenting symptoms.  Today upon evaluation, pt shares, "the voices has improved, but not much. He says he is still hearing those voices" Pt reports ongoing AH that say negative things against him" He endorses VH of seeing "a demon on the wall" he is complaining of bilateral hand tremors. His Cogentin from prn to routine to combat these hand tremors. He denies SI/HI. He is sleeping well. His appetite is good. He is tolerating his medications well, and he feels that they have been helpful. He is in agreement to continue haldol and his other medications without changes. Pt was in agreement with the above plan. He had no further questions, comments, or concerns.  Principal Problem: Schizophrenia, unspecified (HCC) Diagnosis:   Patient Active Problem List    Diagnosis Date Noted  . Schizophrenia, unspecified (HCC) [F20.9] 10/10/2017   Total Time spent with patient: 15 minutes  Past Psychiatric History: See H&P  Past Medical History: History reviewed. No pertinent past medical history. History reviewed. No pertinent surgical history.  Family History: History reviewed. No pertinent family history.  Family Psychiatric  History: See H&P  Social History:  Social History   Substance and Sexual Activity  Alcohol Use Yes   Comment: 1-2 beers every 2 days     Social History   Substance and Sexual Activity  Drug Use Yes  . Types: Marijuana   Comment: occasional THC use    Social History   Socioeconomic History  . Marital status: Single    Spouse name: Not on file  . Number of children: Not on file  . Years of education: Not on file  . Highest education level: Not on file  Occupational History  . Not on file  Social Needs  . Financial resource strain: Not on file  . Food insecurity:    Worry: Not on file    Inability: Not on file  . Transportation needs:    Medical: Not on file    Non-medical: Not on file  Tobacco Use  . Smoking status: Current Every Day Smoker    Packs/day: 0.50  . Smokeless tobacco: Never Used  Substance and Sexual Activity  . Alcohol use: Yes    Comment: 1-2 beers every 2 days  . Drug use: Yes  Types: Marijuana    Comment: occasional THC use  . Sexual activity: Not on file  Lifestyle  . Physical activity:    Days per week: Not on file    Minutes per session: Not on file  . Stress: Not on file  Relationships  . Social connections:    Talks on phone: Not on file    Gets together: Not on file    Attends religious service: Not on file    Active member of club or organization: Not on file    Attends meetings of clubs or organizations: Not on file    Relationship status: Not on file  Other Topics Concern  . Not on file  Social History Narrative  . Not on file   Additional Social History:    Sleep: Good  Appetite:  Good  Current Medications: Current Facility-Administered Medications  Medication Dose Route Frequency Provider Last Rate Last Dose  . acetaminophen (TYLENOL) tablet 650 mg  650 mg Oral Q6H PRN Nira Conn A, NP      . alum & mag hydroxide-simeth (MAALOX/MYLANTA) 200-200-20 MG/5ML suspension 30 mL  30 mL Oral Q4H PRN Nira Conn A, NP      . benztropine (COGENTIN) tablet 1 mg  1 mg Oral BID Micheal Likens, MD      . feeding supplement (ENSURE ENLIVE) (ENSURE ENLIVE) liquid 237 mL  1 Bottle Oral BID BM Micheal Likens, MD   237 mL at 10/16/17 1050  . haloperidol (HALDOL) tablet 5 mg  5 mg Oral Veatrice Kells, MD   5 mg at 10/16/17 0645   And  . haloperidol (HALDOL) tablet 10 mg  10 mg Oral QHS Jolyne Loa T, MD   10 mg at 10/15/17 2035  . haloperidol (HALDOL) tablet 5 mg  5 mg Oral Q6H PRN Micheal Likens, MD   5 mg at 10/15/17 1330   Or  . haloperidol lactate (HALDOL) injection 5 mg  5 mg Intramuscular Q6H PRN Micheal Likens, MD      . hydrOXYzine (ATARAX/VISTARIL) tablet 50 mg  50 mg Oral Q6H PRN Micheal Likens, MD   50 mg at 10/15/17 2034  . magnesium hydroxide (MILK OF MAGNESIA) suspension 30 mL  30 mL Oral Daily PRN Nira Conn A, NP      . multivitamin with minerals tablet 1 tablet  1 tablet Oral Daily Micheal Likens, MD   1 tablet at 10/16/17 1050  . nicotine (NICODERM CQ - dosed in mg/24 hours) patch 21 mg  21 mg Transdermal Daily Micheal Likens, MD   21 mg at 10/16/17 1049  . sertraline (ZOLOFT) tablet 50 mg  50 mg Oral Daily Oneta Rack, NP   50 mg at 10/16/17 1049  . traZODone (DESYREL) tablet 100 mg  100 mg Oral QHS Oneta Rack, NP   100 mg at 10/15/17 2034   Lab Results: No results found for this or any previous visit (from the past 48 hour(s)).  Blood Alcohol level:  Lab Results  Component Value Date   ETH 128 (H) 10/10/2017   Metabolic  Disorder Labs: Lab Results  Component Value Date   HGBA1C 5.5 10/11/2017   MPG 111.15 10/11/2017   Lab Results  Component Value Date   PROLACTIN 22.1 (H) 10/11/2017   Lab Results  Component Value Date   CHOL 217 (H) 10/11/2017   TRIG 423 (H) 10/11/2017   HDL 41 10/11/2017   CHOLHDL 5.3 10/11/2017  VLDL UNABLE TO CALCULATE IF TRIGLYCERIDE OVER 400 mg/dL 16/11/9602   LDLCALC UNABLE TO CALCULATE IF TRIGLYCERIDE OVER 400 mg/dL 54/10/8117   Physical Findings: AIMS: Facial and Oral Movements Muscles of Facial Expression: None, normal Lips and Perioral Area: None, normal Jaw: None, normal Tongue: None, normal,Extremity Movements Upper (arms, wrists, hands, fingers): None, normal Lower (legs, knees, ankles, toes): None, normal, Trunk Movements Neck, shoulders, hips: None, normal, Overall Severity Severity of abnormal movements (highest score from questions above): None, normal Incapacitation due to abnormal movements: None, normal Patient's awareness of abnormal movements (rate only patient's report): No Awareness, Dental Status Current problems with teeth and/or dentures?: No Does patient usually wear dentures?: No  CIWA:    COWS:     Musculoskeletal: Strength & Muscle Tone: within normal limits Gait & Station: normal Patient leans: N/A  Psychiatric Specialty Exam: Physical Exam  Nursing note and vitals reviewed.   Review of Systems  Constitutional: Negative for chills and fever.  Respiratory: Negative for cough and shortness of breath.   Cardiovascular: Negative for chest pain.  Gastrointestinal: Negative for abdominal pain, heartburn, nausea and vomiting.  Psychiatric/Behavioral: Positive for hallucinations (Ongoing psychosis.) and substance abuse (Hx. Alcohol/drug use.). Negative for depression and suicidal ideas. The patient is not nervous/anxious and does not have insomnia.     Blood pressure 99/74, pulse 96, temperature 97.9 F (36.6 C), resp. rate 18, height 6'  2" (1.88 m), weight 73.5 kg, SpO2 100 %.Body mass index is 20.8 kg/m.  General Appearance: Casual and Fairly Groomed  Eye Contact:  Good  Speech:  Clear and Coherent and Normal Rate  Volume:  Normal  Mood:  Anxious  Affect:  Congruent, Constricted, Depressed and Flat  Thought Process:  Coherent and Goal Directed  Orientation:  Full (Time, Place, and Person)  Thought Content:  Hallucinations: Auditory Visual  Suicidal Thoughts:  No  Homicidal Thoughts:  No  Memory:  Immediate;   Good Recent;   Good Remote;   Good  Judgement:  Good  Insight:  Fair  Psychomotor Activity:  Normal  Concentration:  Concentration: Fair  Recall:  Fair  Fund of Knowledge:  Fair  Language:  Fair  Akathisia:  No  Handed:    AIMS (if indicated):     Assets:  Leisure Time Physical Health Resilience Social Support  ADL's:  Intact  Cognition:  WNL  Sleep:  Number of Hours: 6.75   Treatment Plan Summary: Daily contact with patient to assess and evaluate symptoms and progress in treatment and Medication management  -Continue inpatient hospitalization.  -Will continue today 10/16/2017 plan as below except where it is noted.  -Schizophrenia             -Continue Haldol 5mg  po qAM + 10mg  po qhs   -Continue zoloft 50mg  po qDay  -Anxiety             -Continue vistaril 50mg  po q6h prn anxiety  -Agitation/psychosis            -Continue haldol 5mg  po/IM q6h prn agitation/psychosis  -EPS             -Continue cogentin 1mg  po BID routinely for EPS  -insomnia             -Continue trazodone 100 mg po qhs  -Encourage participation in groups and therapeutic milieu  -disposition planning will be ongoing  Armandina Stammer, NP, PMHNP, FNP-BC. 10/16/2017, 1:49 PMPatient ID: Edwin Henry, male   DOB: 12/04/1960, 57 y.o.   MRN: [id number]

## 2017-10-16 NOTE — Progress Notes (Signed)
Recreation Therapy Notes  Date: 9.10.19 Time: 1000 Location: 500 Hall Dayroom  Group Topic: Anger Management  Goal Area(s) Addresses:  Patient will identify triggers for anger.  Patient will identify physical reaction to anger.   Patient will identify benefit of using coping skills when angry.  Intervention: Worksheet, pencils  Activity: Anger Thermometer.  Patients were to rank at least 8 things that get them angry on the thermometer from 1- 10 ( 1 being the lowest and 10 being the highest).  Patients were to then identify five coping skills they can use to help them deal with their anger.  Education: Anger Management, Discharge Planning   Education Outcome: Acknowledges education/In group clarification offered/Needs additional education.   Clinical Observations/Feedback: Patient did not attend group.    Jamon Hayhurst, LRT/CTRS          Johnnye Sandford A 10/16/2017 11:36 AM 

## 2017-10-17 NOTE — Progress Notes (Signed)
Patient ID: Edwin Henry, male   DOB: July 08, 1960, 57 y.o.   MRN: [id number]  D: Pt visible in the day room at start of shift, reported +AVH; stated that he was hearing voices telling him: "we will get you when you get out of here", and was seeing demons.  Pt denies SI/HI. Affect is blunted and mood is depressed.  A: Pt being maintained on Q15 minute checks for safety, and given all meds as scheduled, including Trazodone 100mg  for insomnia, and Haldol 10mg  for +AVH.    R: Will continue to monitor on Q15 minute safety checks.

## 2017-10-17 NOTE — Tx Team (Signed)
Interdisciplinary Treatment and Diagnostic Plan Update  10/17/2017 Time of Session: 9:17 AM  Edwin Henry MRN: [id number]  Principal Diagnosis: Schizophrenia, unspecified ([place])  Secondary Diagnoses: Principal Problem:   Schizophrenia, unspecified ([place])   Current Medications:  Current Facility-Administered Medications  Medication Dose Route Frequency Provider Last Rate Last Dose  . acetaminophen (TYLENOL) tablet 650 mg  650 mg Oral Q6H PRN Lindon Romp A, NP      . alum & mag hydroxide-simeth (MAALOX/MYLANTA) 200-200-20 MG/5ML suspension 30 mL  30 mL Oral Q4H PRN Lindon Romp A, NP      . benztropine (COGENTIN) tablet 1 mg  1 mg Oral BID Pennelope Bracken, MD   1 mg at 10/17/17 0724  . feeding supplement (ENSURE ENLIVE) (ENSURE ENLIVE) liquid 237 mL  1 Bottle Oral BID BM Maris Berger T, MD   237 mL at 10/16/17 1050  . haloperidol (HALDOL) tablet 5 mg  5 mg Oral Rockne Menghini, MD   5 mg at 10/17/17 4235   And  . haloperidol (HALDOL) tablet 10 mg  10 mg Oral QHS Maris Berger T, MD   10 mg at 10/16/17 2105  . haloperidol (HALDOL) tablet 5 mg  5 mg Oral Q6H PRN Pennelope Bracken, MD   5 mg at 10/15/17 1330   Or  . haloperidol lactate (HALDOL) injection 5 mg  5 mg Intramuscular Q6H PRN Pennelope Bracken, MD      . hydrOXYzine (ATARAX/VISTARIL) tablet 50 mg  50 mg Oral Q6H PRN Pennelope Bracken, MD   50 mg at 10/15/17 2034  . magnesium hydroxide (MILK OF MAGNESIA) suspension 30 mL  30 mL Oral Daily PRN Lindon Romp A, NP      . multivitamin with minerals tablet 1 tablet  1 tablet Oral Daily Pennelope Bracken, MD   1 tablet at 10/17/17 0724  . nicotine (NICODERM CQ - dosed in mg/24 hours) patch 21 mg  21 mg Transdermal Daily Pennelope Bracken, MD   Stopped at 10/17/17 0725  . sertraline (ZOLOFT) tablet 50 mg  50 mg Oral Daily Derrill Center, NP   50 mg at 10/17/17 0724  . traZODone (DESYREL) tablet 100 mg  100 mg  Oral QHS Derrill Center, NP   100 mg at 10/16/17 2004    PTA Medications: Medications Prior to Admission  Medication Sig Dispense Refill Last Dose  . cephALEXin (KEFLEX) 500 MG capsule Take 1 capsule (500 mg total) by mouth 4 (four) times daily. (Patient not taking: Reported on 10/10/2017) 20 capsule 0 Completed Course at Unknown time  . cephALEXin (KEFLEX) 500 MG capsule Take 1 capsule (500 mg total) by mouth 4 (four) times daily. (Patient not taking: Reported on 10/10/2017) 20 capsule 0 Completed Course at Unknown time  . oxyCODONE-acetaminophen (PERCOCET) 5-325 MG per tablet Take 1 tablet by mouth every 4 (four) hours as needed for severe pain. (Patient not taking: Reported on 10/10/2017) 10 tablet 0 Completed Course at Unknown time  . oxyCODONE-acetaminophen (PERCOCET/ROXICET) 5-325 MG per tablet Take 2 tablets by mouth every 4 (four) hours as needed for severe pain. (Patient not taking: Reported on 10/10/2017) 6 tablet 0 Completed Course at Unknown time  . sulfamethoxazole-trimethoprim (BACTRIM DS,SEPTRA DS) 800-160 MG per tablet Take 1 tablet by mouth 2 (two) times daily. (Patient not taking: Reported on 10/10/2017) 10 tablet 0 Completed Course at Unknown time  . traMADol (ULTRAM) 50 MG tablet Take 1 tablet (50 mg total) by mouth every 6 (six)  hours as needed. (Patient not taking: Reported on 10/10/2017) 15 tablet 0 Completed Course at Unknown time    Patient Stressors: Financial difficulties Occupational concerns  Patient Strengths: Ability for insight Capable of independent living Agricultural engineer for treatment/growth Physical Health  Treatment Modalities: Medication Management, Group therapy, Case management,  1 to 1 session with clinician, Psychoeducation, Recreational therapy.   Physician Treatment Plan for Primary Diagnosis: Schizophrenia, unspecified ([place]) Long Term Goal(s): Improvement in symptoms so as ready for discharge  Short Term Goals: Ability to identify and  develop effective coping behaviors will improve Ability to maintain clinical measurements within normal limits will improve  Medication Management: Evaluate patient's response, side effects, and tolerance of medication regimen.  Therapeutic Interventions: 1 to 1 sessions, Unit Group sessions and Medication administration.  Evaluation of Outcomes: Progressing   9/11: Edwin Henry reports, "I'm doing good. My mood is good, but, I am still hearing the voices. I also noticed some good changes. The medications are helping. The voices are not as intense no more. I'm seeing the demons off & on. Schizophrenia  -Continue Haldol 74m po qAM + 197mpo qhs             -Continue zoloft 5029mo qDay  -Plan to give injection prior to d/c -Anxiety -Continue vistaril 66m23m q6h prn anxiety   Physician Treatment Plan for Secondary Diagnosis: Principal Problem:   Schizophrenia, unspecified (HCC)[place] Improvement in symptoms so as ready for discharge  Short Term Goals: Ability to identify and develop effective coping behaviors will improve Ability to maintain clinical measurements within normal limits will improve  Medication Management: Evaluate patient's response, side effects, and tolerance of medication regimen.  Therapeutic Interventions: 1 to 1 sessions, Unit Group sessions and Medication administration.  Evaluation of Outcomes: Progressing   RN Treatment Plan for Primary Diagnosis: Schizophrenia, unspecified (HCC)[place] Term Goal(s): Knowledge of disease and therapeutic regimen to maintain health will improve  Short Term Goals: Ability to identify and develop effective coping behaviors will improve and Compliance with prescribed medications will improve  Medication Management: RN will administer medications as ordered by provider, will assess and evaluate patient's response and provide education to patient for prescribed medication. RN will report any  adverse and/or side effects to prescribing provider.  Therapeutic Interventions: 1 on 1 counseling sessions, Psychoeducation, Medication administration, Evaluate responses to treatment, Monitor vital signs and CBGs as ordered, Perform/monitor CIWA, COWS, AIMS and Fall Risk screenings as ordered, Perform wound care treatments as ordered.  Evaluation of Outcomes: Progressing   LCSW Treatment Plan for Primary Diagnosis: Schizophrenia, unspecified (HCC)[place] Term Goal(s): Safe transition to appropriate next level of care at discharge, Engage patient in therapeutic group addressing interpersonal concerns.  Short Term Goals: Engage patient in aftercare planning with referrals and resources  Therapeutic Interventions: Assess for all discharge needs, 1 to 1 time with Social worker, Explore available resources and support systems, Assess for adequacy in community support network, Educate family and significant other(s) on suicide prevention, Complete Psychosocial Assessment, Interpersonal group therapy.  Evaluation of Outcomes: Met  Return home, follow up outpt   Progress in Treatment: Attending groups: No Participating in groups: No Taking medication as prescribed: Yes Toleration medication: Yes, no side effects reported at this time Family/Significant other contact made: No Patient understands diagnosis: Yes AEB asking for help with psychosis Discussing patient identified problems/goals with staff: Yes Medical problems stabilized or resolved: Yes Denies suicidal/homicidal ideation: Yes Issues/concerns per patient self-inventory: None Other:  N/A  New problem(s) identified: None identified at this time.   New Short Term/Long Term Goal(s): "Get what's going on with me under control.  I was hearing voices and seeing things that I shouldn't."   Discharge Plan or Barriers:   Reason for Continuation of Hospitalization:  Depression Hallucinations  Medication stabilization   Estimated  Length of Stay: 9/16  Attendees: Patient:  10/17/2017  9:17 AM  Physician: Maris Berger, MD 10/17/2017  9:17 AM  Nursing: Sena Hitch, RN 10/17/2017  9:17 AM  RN Care Manager: Lars Pinks, RN 10/17/2017  9:17 AM  Social Worker: Ripley Fraise 10/17/2017  9:17 AM  Recreational Therapist: Winfield Cunas 10/17/2017  9:17 AM  Other: Norberto Sorenson 10/17/2017  9:17 AM  Other:  10/17/2017  9:17 AM    Scribe for Treatment Team:  Roque Lias LCSW 10/17/2017 9:17 AM

## 2017-10-17 NOTE — Progress Notes (Signed)
[facility] MD Progress Note  10/17/2017 11:41 AM [name]  MRN:  [id number]  Subjective: Djay reports, "I'm doing good. My mood is good, but, I am still hearing the voices. I also noticed some good changes. The medications are helping. The voices are not as intense no more. I'm seeing the demons off & on. But, I feel like the medicines are helping calm the voices".  Edwin Henry is a 57 y/o M with history of schizophrenia who was admitted voluntarily from [facility] where he presented with worsening depression, SI without plan, AH, and VH in the context of being off of psychotropic medications for several years. Pt was medically cleared and then transferred to [facility] for additional treatment and stabilization. He was started on trial of zyprexa and observed on the inpatient unit. He was also started on trial of zoloft for depression. He has some gradual improvement of his presenting symptoms.  Today upon evaluation, pt shares, "the voices has improved" He says, he is still hearing those voices, only that they are a lot calmer. He thinks that medications are helping.  He continues to endorse VH of seeing "a demons" He did not complain of any hand tremors today. His Cogentin was increased from prn to routine yesterday to combat these hand tremors. He denies SI/HI. He is sleeping well. His appetite is good. He is tolerating his medications well, and he feels that they have been helpful. He is in agreement to continue haldol and his other medications without changes. Pt was in agreement with the above plan. He had no further questions, comments, or concerns.  Principal Problem: Schizophrenia, unspecified (HCC) Diagnosis:   Patient Active Problem List   Diagnosis Date Noted  . Schizophrenia, unspecified (HCC) [F20.9] 10/10/2017   Total Time spent with patient: 15 minutes  Past Psychiatric History: See H&P  Past Medical History: History reviewed. No pertinent past medical history. History reviewed. No  pertinent surgical history.  Family History: History reviewed. No pertinent family history.  Family Psychiatric  History: See H&P  Social History:  Social History   Substance and Sexual Activity  Alcohol Use Yes   Comment: 1-2 beers every 2 days     Social History   Substance and Sexual Activity  Drug Use Yes  . Types: Marijuana   Comment: occasional THC use    Social History   Socioeconomic History  . Marital status: Single    Spouse name: Not on file  . Number of children: Not on file  . Years of education: Not on file  . Highest education level: Not on file  Occupational History  . Not on file  Social Needs  . Financial resource strain: Not on file  . Food insecurity:    Worry: Not on file    Inability: Not on file  . Transportation needs:    Medical: Not on file    Non-medical: Not on file  Tobacco Use  . Smoking status: Current Every Day Smoker    Packs/day: 0.50  . Smokeless tobacco: Never Used  Substance and Sexual Activity  . Alcohol use: Yes    Comment: 1-2 beers every 2 days  . Drug use: Yes    Types: Marijuana    Comment: occasional THC use  . Sexual activity: Not on file  Lifestyle  . Physical activity:    Days per week: Not on file    Minutes per session: Not on file  . Stress: Not on file  Relationships  . Social connections:  Talks on phone: Not on file    Gets together: Not on file    Attends religious service: Not on file    Active member of club or organization: Not on file    Attends meetings of clubs or organizations: Not on file    Relationship status: Not on file  Other Topics Concern  . Not on file  Social History Narrative  . Not on file   Additional Social History:   Sleep: Good  Appetite:  Good  Current Medications: Current Facility-Administered Medications  Medication Dose Route Frequency Provider Last Rate Last Dose  . acetaminophen (TYLENOL) tablet 650 mg  650 mg Oral Q6H PRN Nira Conn A, NP      . alum &  mag hydroxide-simeth (MAALOX/MYLANTA) 200-200-20 MG/5ML suspension 30 mL  30 mL Oral Q4H PRN Nira Conn A, NP      . benztropine (COGENTIN) tablet 1 mg  1 mg Oral BID Micheal Likens, MD   1 mg at 10/17/17 0724  . feeding supplement (ENSURE ENLIVE) (ENSURE ENLIVE) liquid 237 mL  1 Bottle Oral BID BM Jolyne Loa T, MD   237 mL at 10/16/17 1050  . haloperidol (HALDOL) tablet 5 mg  5 mg Oral Veatrice Kells, MD   5 mg at 10/17/17 1610   And  . haloperidol (HALDOL) tablet 10 mg  10 mg Oral QHS Jolyne Loa T, MD   10 mg at 10/16/17 2105  . haloperidol (HALDOL) tablet 5 mg  5 mg Oral Q6H PRN Micheal Likens, MD   5 mg at 10/15/17 1330   Or  . haloperidol lactate (HALDOL) injection 5 mg  5 mg Intramuscular Q6H PRN Micheal Likens, MD      . hydrOXYzine (ATARAX/VISTARIL) tablet 50 mg  50 mg Oral Q6H PRN Micheal Likens, MD   50 mg at 10/15/17 2034  . magnesium hydroxide (MILK OF MAGNESIA) suspension 30 mL  30 mL Oral Daily PRN Nira Conn A, NP      . multivitamin with minerals tablet 1 tablet  1 tablet Oral Daily Micheal Likens, MD   1 tablet at 10/17/17 0724  . nicotine (NICODERM CQ - dosed in mg/24 hours) patch 21 mg  21 mg Transdermal Daily Micheal Likens, MD   Stopped at 10/17/17 0725  . sertraline (ZOLOFT) tablet 50 mg  50 mg Oral Daily Oneta Rack, NP   50 mg at 10/17/17 0724  . traZODone (DESYREL) tablet 100 mg  100 mg Oral QHS Oneta Rack, NP   100 mg at 10/16/17 2004   Lab Results: No results found for this or any previous visit (from the past 48 hour(s)).  Blood Alcohol level:  Lab Results  Component Value Date   ETH 128 (H) 10/10/2017   Metabolic Disorder Labs: Lab Results  Component Value Date   HGBA1C 5.5 10/11/2017   MPG 111.15 10/11/2017   Lab Results  Component Value Date   PROLACTIN 22.1 (H) 10/11/2017   Lab Results  Component Value Date   CHOL 217 (H) 10/11/2017    TRIG 423 (H) 10/11/2017   HDL 41 10/11/2017   CHOLHDL 5.3 10/11/2017   VLDL UNABLE TO CALCULATE IF TRIGLYCERIDE OVER 400 mg/dL 96/05/5407   LDLCALC UNABLE TO CALCULATE IF TRIGLYCERIDE OVER 400 mg/dL 81/19/1478   Physical Findings: AIMS: Facial and Oral Movements Muscles of Facial Expression: None, normal Lips and Perioral Area: None, normal Jaw: None, normal Tongue: None, normal,Extremity Movements Upper (arms,  wrists, hands, fingers): None, normal Lower (legs, knees, ankles, toes): None, normal, Trunk Movements Neck, shoulders, hips: None, normal, Overall Severity Severity of abnormal movements (highest score from questions above): None, normal Incapacitation due to abnormal movements: None, normal Patient's awareness of abnormal movements (rate only patient's report): No Awareness, Dental Status Current problems with teeth and/or dentures?: No Does patient usually wear dentures?: No  CIWA:    COWS:     Musculoskeletal: Strength & Muscle Tone: within normal limits Gait & Station: normal Patient leans: N/A  Psychiatric Specialty Exam: Physical Exam  Nursing note and vitals reviewed.   Review of Systems  Constitutional: Negative for chills and fever.  Respiratory: Negative for cough and shortness of breath.   Cardiovascular: Negative for chest pain.  Gastrointestinal: Negative for abdominal pain, heartburn, nausea and vomiting.  Psychiatric/Behavioral: Positive for hallucinations (Ongoing psychosis.) and substance abuse (Hx. Alcohol/drug use.). Negative for depression and suicidal ideas. The patient is not nervous/anxious and does not have insomnia.     Blood pressure 99/74, pulse 96, temperature 97.9 F (36.6 C), resp. rate 18, height 6\' 2"  (1.88 m), weight 73.5 kg, SpO2 100 %.Body mass index is 20.8 kg/m.  General Appearance: Casual and Fairly Groomed  Eye Contact:  Good  Speech:  Clear and Coherent and Normal Rate  Volume:  Normal  Mood:  Anxious  Affect:  Congruent,  Constricted, Depressed and Flat  Thought Process:  Coherent and Goal Directed  Orientation:  Full (Time, Place, and Person)  Thought Content:  Hallucinations: Auditory Visual  Suicidal Thoughts:  No  Homicidal Thoughts:  No  Memory:  Immediate;   Good Recent;   Good Remote;   Good  Judgement:  Good  Insight:  Fair  Psychomotor Activity:  Normal  Concentration:  Concentration: Fair  Recall:  Fair  Fund of Knowledge:  Fair  Language:  Fair  Akathisia:  No  Handed:    AIMS (if indicated):     Assets:  Leisure Time Physical Health Resilience Social Support  ADL's:  Intact  Cognition:  WNL  Sleep:  Number of Hours: 6.25   Treatment Plan Summary: Daily contact with patient to assess and evaluate symptoms and progress in treatment and Medication management  -Continue inpatient hospitalization.  -Will continue today 10/17/2017 plan as below except where it is noted.  -Schizophrenia             -Continue Haldol 5mg  po qAM + 10mg  po qhs   -Continue zoloft 50mg  po qDay  -Anxiety             -Continue vistaril 50mg  po q6h prn anxiety  -Agitation/psychosis            -Continue haldol 5mg  po/IM q6h prn agitation/psychosis  -EPS             -Continue cogentin 1mg  po BID routinely for EPS  -insomnia             -Continue trazodone 100 mg po qhs  -Encourage participation in groups and therapeutic milieu  -disposition planning will be ongoing  Armandina Stammer, NP, PMHNP, FNP-BC. 10/17/2017, 11:41 AMPatient ID: Edwin Henry, male   DOB: 02/10/1960, 57 y.o.   MRN: [id number]

## 2017-10-17 NOTE — Plan of Care (Signed)
Progress note  D: pt found in the hallway; compliant with medication administration. Pt states he slept well. Pt rates his depression/hopelessness/anxiety a 5/5/7 out of 10 respectively. Pt rates his pain in his back and knee at a 5/10. Pt provided medications. Pt states his goal for today is to attend group. Pt denies any si but endorses ah/vh. Pt agrees to approach staff before harming himself while at [facility].  A: pt provided support and encouragement. Pt given medications per protocol and standing orders. Q64m safety checks implemented and continued.  R: pt safe on the unit. Will continue to monitor.   Pt progressing in the following metrics  Problem: Education: Goal: Emotional status will improve Outcome: Progressing Goal: Verbalization of understanding the information provided will improve Outcome: Progressing   Problem: Activity: Goal: Interest or engagement in activities will improve Outcome: Progressing Goal: Sleeping patterns will improve Outcome: Progressing   Problem: Coping: Goal: Ability to demonstrate self-control will improve Outcome: Progressing   Problem: Health Behavior/Discharge Planning: Goal: Compliance with treatment plan for underlying cause of condition will improve Outcome: Progressing

## 2017-10-17 NOTE — Progress Notes (Signed)
Recreation Therapy Notes  Date: 9.11.19 Time: 1000 Location: 500 Hall Dayroom  Group Topic: Music Therapy  Goal Area(s) Addresses:  Patient will identify the benefits of listening to relaxing music. Patient will identify music that can help you relax.  Intervention:  Music  Activity:  Music Therapy.  LRT played some soft music the patients.  Patients had the opportunity to request a soothing song that they wanted played for the group.  Education: Communication, Discharge Planning  Education Outcome: Acknowledges understanding/In group clarification offered/Needs additional education.   Clinical Observations/Feedback: Pt did not attend group.    Caroll Rancher, LRT/CTRS         Caroll Rancher A 10/17/2017 12:32 PM

## 2017-10-17 NOTE — [facility] Group Notes (Signed)
LCSW Group Therapy Note  10/17/2017 1:15pm  Type of Therapy/Topic:  Group Therapy:  Emotion Regulation  Participation Level:  Did Not Attend   Description of Group:   The purpose of this group is to assist patients in learning to regulate negative emotions and experience positive emotions. Patients will be guided to discuss ways in which they have been vulnerable to their negative emotions. These vulnerabilities will be juxtaposed with experiences of positive emotions or situations, and patients will be challenged to use positive emotions to combat negative ones. Special emphasis will be placed on coping with negative emotions in conflict situations, and patients will process healthy conflict resolution skills.  Therapeutic Goals: 1. Patient will identify two positive emotions or experiences to reflect on in order to balance out negative emotions 2. Patient will label two or more emotions that they find the most difficult to experience 3. Patient will demonstrate positive conflict resolution skills through discussion and/or role plays  Summary of Patient Progress:       Therapeutic Modalities:   Cognitive Behavioral Therapy Feelings Identification Dialectical Behavioral Therapy   [name], LCSW 10/17/2017 1:20 PM

## 2017-10-18 NOTE — Progress Notes (Signed)
[facility] MD Progress Note  10/18/2017 4:26 PM Edwin Henry  MRN:  [id number] Subjective:    Edwin Henry is a 57 y/o M with history of schizophrenia who was admitted voluntarily from [facility] where he presented with worsening depression, SI without plan, AH, and VH in the context of being off of psychotropic medications for several years. Pt was medically cleared and then transferred to [facility] for additional treatment and stabilization.He was started on trial of zyprexa and observed on the inpatient unit. He was also started on trial of zoloft for depression. He reported ongoing AH, so he was changed from zyprexa to haldol and dose was titrated up. He has been reporting gradual improvement of his presenting symptoms.  Today upon evaluation, pt shares, "I think I'm doing better - maybe I could go home tomorrow." Pt reports that AH have continued to diminish, though they are present at some points during the day. Pt denies SI/HI/VH. He is sleeping well. His appetite is good. He feels it would be safe for him to discharge to outpatient level of care tomorrow. We will continue his current regimen without changes. Pt was in agreement with the above plan and he had no further questions, comments, or concerns.  Principal Problem: Schizophrenia, unspecified (HCC) Diagnosis:   Patient Active Problem List   Diagnosis Date Noted  . Schizophrenia, unspecified (HCC) [F20.9] 10/10/2017   Total Time spent with patient: 30 minutes  Past Psychiatric History: see H&P  Past Medical History: History reviewed. No pertinent past medical history. History reviewed. No pertinent surgical history. Family History: History reviewed. No pertinent family history. Family Psychiatric  History: see H&P Social History:  Social History   Substance and Sexual Activity  Alcohol Use Yes   Comment: 1-2 beers every 2 days     Social History   Substance and Sexual Activity  Drug Use Yes  . Types: Marijuana   Comment: occasional  THC use    Social History   Socioeconomic History  . Marital status: Single    Spouse name: Not on file  . Number of children: Not on file  . Years of education: Not on file  . Highest education level: Not on file  Occupational History  . Not on file  Social Needs  . Financial resource strain: Not on file  . Food insecurity:    Worry: Not on file    Inability: Not on file  . Transportation needs:    Medical: Not on file    Non-medical: Not on file  Tobacco Use  . Smoking status: Current Every Day Smoker    Packs/day: 0.50  . Smokeless tobacco: Never Used  Substance and Sexual Activity  . Alcohol use: Yes    Comment: 1-2 beers every 2 days  . Drug use: Yes    Types: Marijuana    Comment: occasional THC use  . Sexual activity: Not on file  Lifestyle  . Physical activity:    Days per week: Not on file    Minutes per session: Not on file  . Stress: Not on file  Relationships  . Social connections:    Talks on phone: Not on file    Gets together: Not on file    Attends religious service: Not on file    Active member of club or organization: Not on file    Attends meetings of clubs or organizations: Not on file    Relationship status: Not on file  Other Topics Concern  . Not on file  Social History Narrative  . Not on file   Additional Social History:                         Sleep: Good  Appetite:  Good  Current Medications: Current Facility-Administered Medications  Medication Dose Route Frequency Provider Last Rate Last Dose  . acetaminophen (TYLENOL) tablet 650 mg  650 mg Oral Q6H PRN Nira Conn A, NP   650 mg at 10/17/17 1258  . alum & mag hydroxide-simeth (MAALOX/MYLANTA) 200-200-20 MG/5ML suspension 30 mL  30 mL Oral Q4H PRN Nira Conn A, NP      . benztropine (COGENTIN) tablet 1 mg  1 mg Oral BID Micheal Likens, MD   1 mg at 10/18/17 0731  . feeding supplement (ENSURE ENLIVE) (ENSURE ENLIVE) liquid 237 mL  1 Bottle Oral BID BM  Micheal Likens, MD   237 mL at 10/18/17 1522  . haloperidol (HALDOL) tablet 5 mg  5 mg Oral Veatrice Kells, MD   5 mg at 10/18/17 0731   And  . haloperidol (HALDOL) tablet 10 mg  10 mg Oral QHS Micheal Likens, MD   10 mg at 10/17/17 2103  . haloperidol (HALDOL) tablet 5 mg  5 mg Oral Q6H PRN Micheal Likens, MD   5 mg at 10/15/17 1330   Or  . haloperidol lactate (HALDOL) injection 5 mg  5 mg Intramuscular Q6H PRN Micheal Likens, MD      . hydrOXYzine (ATARAX/VISTARIL) tablet 50 mg  50 mg Oral Q6H PRN Micheal Likens, MD   50 mg at 10/15/17 2034  . magnesium hydroxide (MILK OF MAGNESIA) suspension 30 mL  30 mL Oral Daily PRN Nira Conn A, NP      . multivitamin with minerals tablet 1 tablet  1 tablet Oral Daily Micheal Likens, MD   1 tablet at 10/18/17 0731  . nicotine (NICODERM CQ - dosed in mg/24 hours) patch 21 mg  21 mg Transdermal Daily Micheal Likens, MD   21 mg at 10/18/17 0730  . sertraline (ZOLOFT) tablet 50 mg  50 mg Oral Daily Oneta Rack, NP   50 mg at 10/18/17 0731  . traZODone (DESYREL) tablet 100 mg  100 mg Oral QHS Oneta Rack, NP   100 mg at 10/17/17 2043    Lab Results: No results found for this or any previous visit (from the past 48 hour(s)).  Blood Alcohol level:  Lab Results  Component Value Date   ETH 128 (H) 10/10/2017    Metabolic Disorder Labs: Lab Results  Component Value Date   HGBA1C 5.5 10/11/2017   MPG 111.15 10/11/2017   Lab Results  Component Value Date   PROLACTIN 22.1 (H) 10/11/2017   Lab Results  Component Value Date   CHOL 217 (H) 10/11/2017   TRIG 423 (H) 10/11/2017   HDL 41 10/11/2017   CHOLHDL 5.3 10/11/2017   VLDL UNABLE TO CALCULATE IF TRIGLYCERIDE OVER 400 mg/dL 16/11/9602   LDLCALC UNABLE TO CALCULATE IF TRIGLYCERIDE OVER 400 mg/dL 54/10/8117    Physical Findings: AIMS: Facial and Oral Movements Muscles of Facial Expression: None,  normal Lips and Perioral Area: None, normal Jaw: None, normal Tongue: None, normal,Extremity Movements Upper (arms, wrists, hands, fingers): None, normal Lower (legs, knees, ankles, toes): None, normal, Trunk Movements Neck, shoulders, hips: None, normal, Overall Severity Severity of abnormal movements (highest score from questions above): None, normal Incapacitation due to abnormal  movements: None, normal Patient's awareness of abnormal movements (rate only patient's report): No Awareness, Dental Status Current problems with teeth and/or dentures?: No Does patient usually wear dentures?: No  CIWA:    COWS:     Musculoskeletal: Strength & Muscle Tone: within normal limits Gait & Station: normal Patient leans: N/A  Psychiatric Specialty Exam: Physical Exam  Nursing note and vitals reviewed.   Review of Systems  Constitutional: Negative for chills and fever.  Respiratory: Negative for cough and shortness of breath.   Cardiovascular: Negative for chest pain.  Gastrointestinal: Negative for abdominal pain, heartburn, nausea and vomiting.  Psychiatric/Behavioral: Negative for depression, hallucinations and suicidal ideas. The patient is not nervous/anxious and does not have insomnia.     Blood pressure 99/74, pulse 96, temperature 97.9 F (36.6 C), resp. rate 18, height 6\' 2"  (1.88 m), weight 73.5 kg, SpO2 100 %.Body mass index is 20.8 kg/m.  General Appearance: Casual and Fairly Groomed  Eye Contact:  Good  Speech:  Clear and Coherent and Normal Rate  Volume:  Normal  Mood:  Euthymic  Affect:  Appropriate and Congruent  Thought Process:  Coherent and Goal Directed  Orientation:  Full (Time, Place, and Person)  Thought Content:  Hallucinations: Auditory  Suicidal Thoughts:  No  Homicidal Thoughts:  No  Memory:  Immediate;   Fair Recent;   Fair Remote;   Fair  Judgement:  Fair  Insight:  Fair  Psychomotor Activity:  Normal  Concentration:  Concentration: Fair  Recall:   [employer]Fair  Fund of Knowledge:  Fair  Language:  Fair  Akathisia:  No  Handed:    AIMS (if indicated):     Assets:  Resilience Social Support  ADL's:  Intact  Cognition:  WNL  Sleep:  Number of Hours: 6.5   Treatment Plan Summary: Daily contact with patient to assess and evaluate symptoms and progress in treatment and Medication management   -Continue inpatient hospitalization.  -Will continue today 10/18/2017 plan as below except where it is noted.  -Schizophrenia  -Continue Haldol 5mg  po qAM + 10mg  po qhs             -Continue zoloft 50mg  po qDay  -Anxiety -Continue vistaril 50mg  po q6h prn anxiety  -Agitation/psychosis -Continue haldol 5mg  po/IM q6h prn agitation/psychosis  -EPS -Continue cogentin 1mg  po BID routinely for EPS  -insomnia -Continue trazodone 100 mg po qhs  -Encourage participation in groups and therapeutic milieu  -disposition planning will be ongoing   Micheal Likenshristopher T Davieon Stockham, MD 10/18/2017, 4:26 PM

## 2017-10-18 NOTE — [facility] Group Notes (Signed)
LCSW Group Therapy Note  10/18/2017 1:15pm  Type of Therapy/Topic:  Group Therapy:  Balance in Life  Participation Level:  Did Not Attend  Description of Group:    This group will address the concept of balance and how it feels and looks when one is unbalanced. Patients will be encouraged to process areas in their lives that are out of balance and identify reasons for remaining unbalanced. Facilitators will guide patients in utilizing problem-solving interventions to address and correct the stressor making their life unbalanced. Understanding and applying boundaries will be explored and addressed for obtaining and maintaining a balanced life. Patients will be encouraged to explore ways to assertively make their unbalanced needs known to significant others in their lives, using other group members and facilitator for support and feedback.  Therapeutic Goals: 1. Patient will identify two or more emotions or situations they have that consume much of in their lives. 2. Patient will identify signs/triggers that life has become out of balance:  3. Patient will identify two ways to set boundaries in order to achieve balance in their lives:  4. Patient will demonstrate ability to communicate their needs through discussion and/or role plays  Summary of Patient Progress:      Therapeutic Modalities:   Cognitive Behavioral Therapy Solution-Focused Therapy Assertiveness Training  Ida RogueRodney B Shaquan Missey, LCSW 10/18/2017 1:19 PM

## 2017-10-18 NOTE — [facility] Suicide Risk Assessment (Signed)
[facility] INPATIENT:  Family/Significant Other Suicide Prevention Education  Suicide Prevention Education:  Contact Attempts: Mother has been identified by the patient as the family member/significant other with whom the patient will be residing, and identified as the person(s) who will aid the patient in the event of a mental health crisis.  With written consent from the patient, two attempts were made to provide suicide prevention education, prior to and/or following the patient's discharge.  We were unsuccessful in providing suicide prevention education.  A suicide education pamphlet was given to the patient to share with family/significant other.  Date and time of first attempt:10/14/2017 2:07 PM Date and time of second attempt:10/18/2017 5:15 PM   Ida RogueRodney B Hasel Janish 10/18/2017, 5:15 PM

## 2017-10-18 NOTE — Progress Notes (Signed)
Recreation Therapy Notes  Date: 9.12.19 Time: 0955 Location: 500 Hall Dayroom  Group Topic: Self-Esteem  Goal Area(s) Addresses:  Patient will successfully identify positive attributes about themselves.  Patient will successfully identify benefit of improved self-esteem.   Intervention: Worksheet, colored pencils, markers  Activity: Customized Plates.  Patients were given a blank outline of a license plate.  Patients were to design the plate with things that describe them such as birth dates, favorite foods, important dates, accomplishments, things that make them unique or things they are proud of.  Education:  Self-Esteem, Discharge Planning.   Education Outcome: Acknowledges education/In group clarification offered/Needs additional education  Clinical Observations/Feedback: Pt did not attend group.    Edwin Henry, LRT/CTRS         Edwin Henry A 10/18/2017 11:55 AM 

## 2017-10-18 NOTE — Progress Notes (Signed)
Psychoeducational Group Note  Date:  10/18/2017 Time:  2101  Group Topic/Focus:  Wrap-Up Group:   The focus of this group is to help patients review their daily goal of treatment and discuss progress on daily workbooks.  Participation Level: Did Not Attend  Participation Quality:  Not Applicable  Affect:  Not Applicable  Cognitive:  Not Applicable  Insight:  Not Applicable  Engagement in Group: Not Applicable  Additional Comments:  The patient did not attend group this evening.   Hazle CocaGOODMAN, Curtisha Bendix S 10/18/2017, 9:01 PM

## 2017-10-18 NOTE — Plan of Care (Signed)
  Problem: Education: Goal: Emotional status will improve Outcome: Progressing   Problem: Activity: Goal: Interest or engagement in activities will improve Outcome: Progressing   Problem: Safety: Goal: Periods of time without injury will increase Outcome: Progressing  DAR NOTE: Patient presents with anxious affect and depressed mood.  Denies suicidal thoughts, pain, auditory and visual hallucinations.  Described energy level as normal and concentration as good.  Rates depression at 5, hopelessness at 5, and anxiety at 5.  Maintained on routine safety checks.  Medications given as prescribed.  Support and encouragement offered as needed.  Attended group and participated.  States goal for today is "try to make things happen."  Patient visible in milieu briefly.  Offered no complaint.

## 2017-10-19 MED ORDER — HALOPERIDOL 5 MG PO TABS: ORAL_TABLET | ORAL | 0 refills | Status: DC

## 2017-10-19 MED ORDER — HYDROXYZINE HCL 50 MG PO TABS: 50.0000 mg | ORAL_TABLET | Freq: Four times a day (QID) | ORAL | 0 refills | Status: DC | PRN

## 2017-10-19 MED ORDER — TRAZODONE HCL 100 MG PO TABS: 100.0000 mg | ORAL_TABLET | Freq: Every day | ORAL | 0 refills | Status: DC

## 2017-10-19 MED ORDER — NICOTINE 21 MG/24HR TD PT24: 21.0000 mg | MEDICATED_PATCH | Freq: Every day | TRANSDERMAL | 0 refills | Status: DC

## 2017-10-19 MED ORDER — BENZTROPINE MESYLATE 1 MG PO TABS: 1.0000 mg | ORAL_TABLET | Freq: Two times a day (BID) | ORAL | 0 refills | Status: DC

## 2017-10-19 MED ORDER — SERTRALINE HCL 50 MG PO TABS: 50.0000 mg | ORAL_TABLET | Freq: Every day | ORAL | 0 refills | Status: DC

## 2017-10-19 NOTE — Progress Notes (Signed)
Patient ID: Edwin Henry, male   DOB: 1960/02/24, 57 y.o.   MRN: [id number] DAR Note: Pt continue to endorse moderate anxiety and some AH; denies command hallucinations; "I can hardly makeup what they are saying now. Pt denies SI, HI, or pain. Pt was med compliant. All patient's questions and concerns addressed. Support, encouragement, and safe environment provided. 15-minute safety checks continue.  Pt did attend wrap-up group.

## 2017-10-19 NOTE — Discharge Summary (Addendum)
Physician Discharge Summary Note  Patient:  Edwin Henry is an 57 y.o., male  MRN:  [id number]  DOB:  February 13, 1960  Patient phone:  [phone] ([phone])   Patient address:   [address] [place] [address],   Total Time spent with patient: Greater than 30 minutes  Date of Admission:  10/10/2017  Date of Discharge: 10-19-17  Reason for Admission: Worsening depression, SI without plan, AH, and VH in the context of being off of psychotropic medications for several years.  Principal Problem: Schizophrenia, unspecified [facility](HCC)  Discharge Diagnoses: Patient Active Problem List   Diagnosis Date Noted  . Schizophrenia, unspecified (HCC) [F20.9] 10/10/2017   Past Psychiatric History: Schizophrenia  Past Medical History: History reviewed. No pertinent past medical history. History reviewed. No pertinent surgical history.  Family History: History reviewed. No pertinent family history.  Family Psychiatric  History: See H&P  Social History:  Social History   Substance and Sexual Activity  Alcohol Use Yes   Comment: 1-2 beers every 2 days     Social History   Substance and Sexual Activity  Drug Use Yes  . Types: Marijuana   Comment: occasional THC use    Social History   Socioeconomic History  . Marital status: Single    Spouse name: Not on file  . Number of children: Not on file  . Years of education: Not on file  . Highest education level: Not on file  Occupational History  . Not on file  Social Needs  . Financial resource strain: Not on file  . Food insecurity:    Worry: Not on file    Inability: Not on file  . Transportation needs:    Medical: Not on file    Non-medical: Not on file  Tobacco Use  . Smoking status: Current Every Day Smoker    Packs/day: 0.50  . Smokeless tobacco: Never Used  Substance and Sexual Activity  . Alcohol use: Yes    Comment: 1-2 beers every 2 days  . Drug use: Yes    Types: Marijuana    Comment: occasional THC use   . Sexual activity: Not on file  Lifestyle  . Physical activity:    Days per week: Not on file    Minutes per session: Not on file  . Stress: Not on file  Relationships  . Social connections:    Talks on phone: Not on file    Gets together: Not on file    Attends religious service: Not on file    Active member of club or organization: Not on file    Attends meetings of clubs or organizations: Not on file    Relationship status: Not on file  Other Topics Concern  . Not on file  Social History Narrative  . Not on file   Hospital Course: (Per Md's discharge SRA): Edwin HackerBilly Eaker is a 57 y/o M with history of schizophrenia who was admitted voluntarily from [facility] where he presented with worsening depression, SI without plan, AH, and VH in the context of being off of psychotropic medications for several years. Pt was medically cleared and [facility] and [facility].He was started on trial of zyprexa and observed on the inpatient unit.He was also started on trial of zoloft for depression.He reported ongoing AH, so he was changed from zyprexa to haldol and dose was titrated up.Hehas been reportinggradual improvement of his presenting symptoms.  Today upon evaluation, pt shares, "I'm feeling a lot better. I'm  still having the voices but they are livable." Pt reports that AH remain minimal and non-intrusive.  Pt denies SI/HI/VH. He is sleeping well. His appetite is good. He feels safe to discharge to outpatient level of care. He plans to follow up at [facility]. He was able to engage in safety planning including plan to return to [facility] or contact emergency services if he feels unable to maintain his own safety or the safety of others. Pt had no further questions, comments, or concerns.  Plan Of Care/Follow-up recommendations:   -Discharge to outpatient level of care  -Schizophrenia -Continue Haldol 5mg  po qAM + 10mg  po  qhs -Continue zoloft 50mg  po qDay  -Anxiety -Continue vistaril 50mg  po q6h prn anxiety  -EPS -Continue cogentin 1mg  po BID routinely for EPS  -insomnia -Continue trazodone 100 mg po qhs  Activity:  as tolerated Diet:  normal Tests:  NA Other:  see above for   Physical Findings: AIMS: Facial and Oral Movements Muscles of Facial Expression: None, normal Lips and Perioral Area: None, normal Jaw: None, normal Tongue: None, normal,Extremity Movements Upper (arms, wrists, hands, fingers): None, normal Lower (legs, knees, ankles, toes): None, normal, Trunk Movements Neck, shoulders, hips: None, normal, Overall Severity Severity of abnormal movements (highest score from questions above): None, normal Incapacitation due to abnormal movements: None, normal Patient's awareness of abnormal movements (rate only patient's report): No Awareness, Dental Status Current problems with teeth and/or dentures?: No Does patient usually wear dentures?: No  CIWA:    COWS:     Musculoskeletal: Strength & Muscle Tone: within normal limits Gait & Station: normal Patient leans: N/A  Psychiatric Specialty Exam: Physical Exam  Nursing note and vitals reviewed. Constitutional: He appears well-developed.  HENT:  Head: Normocephalic.  Eyes: Pupils are equal, round, and reactive to light.  Neck: Normal range of motion.  Cardiovascular: Normal rate.  Respiratory: Effort normal.  GI: Soft.  Genitourinary:  Genitourinary Comments: Deferred  Musculoskeletal: Normal range of motion.  Neurological: He is alert.  Skin: Skin is warm.    Review of Systems  Constitutional: Negative.   HENT: Negative.   Eyes: Negative.   Respiratory: Negative.   Cardiovascular: Negative.   Gastrointestinal: Negative.   Genitourinary: Negative.   Musculoskeletal: Negative.   Skin: Negative.   Neurological: Negative.   Endo/Heme/Allergies: Negative.    Psychiatric/Behavioral: Positive for depression (Stable), hallucinations (Hx. Psychosis (stable)) and substance abuse (Hx. Hx of Cocain & THC use disorder). Negative for memory loss and suicidal ideas. The patient has insomnia (Stable). The patient is not nervous/anxious.     Blood pressure 113/75, pulse 90, temperature 97.9 F (36.6 C), resp. rate 18, height 6\' 2"  (1.88 m), weight 73.5 kg, SpO2 100 %.Body mass index is 20.8 kg/m.  See Md's SRA   Have you used any form of tobacco in the last 30 days? (Cigarettes, Smokeless Tobacco, Cigars, and/or Pipes): Yes  Has this patient used any form of tobacco in the last 30 days? (Cigarettes, Smokeless Tobacco, Cigars, and/or Pipes):Yes, an FDA-approved tobacco cessation medication was offered at discharge.  Blood Alcohol level:  Lab Results  Component Value Date   ETH 128 (H) 10/10/2017   Metabolic Disorder Labs:  Lab Results  Component Value Date   HGBA1C 5.5 10/11/2017   MPG 111.15 10/11/2017   Lab Results  Component Value Date   PROLACTIN 22.1 (H) 10/11/2017   Lab Results  Component Value Date   CHOL 217 (H) 10/11/2017   TRIG 423 (H) 10/11/2017  HDL 41 10/11/2017   CHOLHDL 5.3 10/11/2017   VLDL UNABLE TO CALCULATE IF TRIGLYCERIDE OVER 400 mg/dL 08/65/7846   LDLCALC UNABLE TO CALCULATE IF TRIGLYCERIDE OVER 400 mg/dL 96/29/5284   See Psychiatric Specialty Exam and Suicide Risk Assessment completed by Attending Physician prior to discharge.  Discharge destination:  Home  Is patient on multiple antipsychotic therapies at discharge:  No   Has Patient had three or more failed trials of antipsychotic monotherapy by history:  No  Recommended Plan for Multiple Antipsychotic Therapies: NA  Allergies as of 10/19/2017   No Known Allergies     Medication List    STOP taking these medications   cephALEXin 500 MG capsule Commonly known as:  KEFLEX   oxyCODONE-acetaminophen 5-325 MG tablet Commonly known as:  PERCOCET/ROXICET    sulfamethoxazole-trimethoprim 800-160 MG tablet Commonly known as:  BACTRIM DS,SEPTRA DS   traMADol 50 MG tablet Commonly known as:  ULTRAM     TAKE these medications     Indication  benztropine 1 MG tablet Commonly known as:  COGENTIN Take 1 tablet (1 mg total) by mouth 2 (two) times daily. For prevention of drug induced tremors  Indication:  Extrapyramidal Reaction caused by Medications   haloperidol 5 MG tablet Commonly known as:  HALDOL Take 1 tablet (5 mg) by mouth in the morning & 2 tablet (10 mg) at bedtime: For mood control  Indication:  Mood control   hydrOXYzine 50 MG tablet Commonly known as:  ATARAX/VISTARIL Take 1 tablet (50 mg total) by mouth every 6 (six) hours as needed for anxiety.  Indication:  Feeling Anxious   nicotine 21 mg/24hr patch Commonly known as:  NICODERM CQ - dosed in mg/24 hours Place 1 patch (21 mg total) onto the skin daily. (May buy from over the counter): For smoking cessation Start taking on:  10/20/2017  Indication:  Nicotine Addiction   sertraline 50 MG tablet Commonly known as:  ZOLOFT Take 1 tablet (50 mg total) by mouth daily. For depression Start taking on:  10/20/2017  Indication:  Major Depressive Disorder   traZODone 100 MG tablet Commonly known as:  DESYREL Take 1 tablet (100 mg total) by mouth at bedtime. For sleep  Indication:  Trouble Sleeping      Follow-up Information    [name] Follow up on 10/25/2017.   Specialty:  Family Medicine Why:  Thursday at 9:15 with Constance Haw., and then 11:00 same day with Arn Medal.  Contact information: [address] [place] [address] [phone]          Follow-up recommendations: Activity:  As tolerated Diet: As recommended by your primary care doctor. Keep all scheduled follow-up appointments as recommended.   Comments: Patient is instructed prior to discharge to: Take all medications as prescribed by his/her mental  healthcare provider. Report any adverse effects and or reactions from the medicines to his/her outpatient provider promptly. Patient has been instructed & cautioned: To not engage in alcohol and or illegal drug use while on prescription medicines. In the event of worsening symptoms, patient is instructed to call the crisis hotline, 911 and or go to the nearest ED for appropriate evaluation and treatment of symptoms. To follow-up with his/her primary care provider for your other medical issues, concerns and or health care needs.   Signed: Armandina Stammer, NP, PMHNP, FNP-BC 10/19/2017, 9:11 AM   Patient seen, Suicide Assessment Completed.  Disposition Plan Reviewed

## 2017-10-19 NOTE — Progress Notes (Signed)
  [facility] Adult Case Management Discharge Plan :  Will you be returning to the same living situation after discharge:  Yes,  home At discharge, do you have transportation home?: Yes,  cab Do you have the ability to pay for your medications: Yes,  MCD  Release of information consent forms completed and in the chart;  Patient's signature needed at discharge.  Patient to Follow up at: Follow-up Information    [facility] Follow up on 10/25/2017.   Specialty:  Family Medicine Why:  Thursday at 9:15 with Constance Hawierra G., and then 11:00 same day with Arn MedalPatricia Adams.  Contact information: [address] [place] [address] [phone]           Next level of care provider has access to [facility] Link:no  Safety Planning and Suicide Prevention discussed: Yes,  yes  Have you used any form of tobacco in the last 30 days? (Cigarettes, Smokeless Tobacco, Cigars, and/or Pipes): Yes  Has patient been referred to the Quitline?: Patient refused referral  Patient has been referred for addiction treatment: Yes  Ida RogueRodney B Terri Rorrer, LCSW 10/19/2017, 9:04 AM

## 2017-10-19 NOTE — Progress Notes (Signed)
Patient discharged to lobby. Patient was stable and appreciative at that time. All papers and prescriptions were given and valuables returned. Verbal understanding expressed. Denies SI/HI and A/VH. Patient given opportunity to express concerns and ask questions. Staff was giving patient direction to the bus stop when patient stated, "I don't need direction because [facility] and MD made aware of patient statement.

## 2017-10-19 NOTE — [facility] Suicide Risk Assessment (Signed)
[facility] Discharge Suicide Risk Assessment   Principal Problem: Schizophrenia, unspecified [facility]) Discharge Diagnoses:  Patient Active Problem List   Diagnosis Date Noted  . Schizophrenia, unspecified (HCC) [F20.9] 10/10/2017    Total Time spent with patient: 30 minutes  Musculoskeletal: Strength & Muscle Tone: within normal limits Gait & Station: normal Patient leans: N/A  Psychiatric Specialty Exam: Review of Systems  Constitutional: Negative for chills and fever.  Respiratory: Negative for cough and shortness of breath.   Cardiovascular: Negative for chest pain.  Gastrointestinal: Negative for abdominal pain, heartburn, nausea and vomiting.  Psychiatric/Behavioral: Positive for hallucinations. Negative for depression and suicidal ideas. The patient is not nervous/anxious and does not have insomnia.     Blood pressure 113/75, pulse 90, temperature 97.9 F (36.6 C), resp. rate 18, height 6\' 2"  (1.88 m), weight 73.5 kg, SpO2 100 %.Body mass index is 20.8 kg/m.  General Appearance: Casual and Fairly Groomed  Patent attorney::  Good  Speech:  Clear and Coherent and Normal Rate  Volume:  Normal  Mood:  Euthymic  Affect:  Appropriate and Congruent  Thought Process:  Coherent and Goal Directed  Orientation:  Full (Time, Place, and Person)  Thought Content:  Hallucinations: Auditory  Suicidal Thoughts:  No  Homicidal Thoughts:  No  Memory:  Immediate;   Fair Recent;   Fair Remote;   Fair  Judgement:  Poor  Insight:  Fair  Psychomotor Activity:  Normal  Concentration:  Fair  Recall:  [employer] of Knowledge:Fair  Language: Fair  Akathisia:  No  Handed:    AIMS (if indicated):     Assets:  Communication Skills Resilience Social Support  Sleep:  Number of Hours: 6.5  Cognition: WNL  ADL's:  Intact   Mental Status Per Nursing Assessment::   On Admission:  Self-harm thoughts  Demographic Factors:  Male, Low socioeconomic status and Unemployed  Loss Factors: Financial  problems/change in socioeconomic status  Historical Factors: Impulsivity  Risk Reduction Factors:   Sense of responsibility to family, Positive social support, Positive therapeutic relationship and Positive coping skills or problem solving skills  Continued Clinical Symptoms:  Schizophrenia:   Paranoid or undifferentiated type  Cognitive Features That Contribute To Risk:  None    Suicide Risk:  Minimal: No identifiable suicidal ideation.  Patients presenting with no risk factors but with morbid ruminations; may be classified as minimal risk based on the severity of the depressive symptoms  Follow-up Information    [name] Follow up on 10/25/2017.   Specialty:  Family Medicine Why:  Thursday at 9:15 with Constance Haw., and then 11:00 same day with Arn Medal.  Contact information: [address] [place] [address] [phone]         Subjective Data:  Edwin Henry is a 57 y/o M with history of schizophrenia who was admitted voluntarily from [facility] where he presented with worsening depression, SI without plan, AH, and VH in the context of being off of psychotropic medications for several years. Pt was medically cleared and then transferred to [facility] for additional treatment and stabilization.He was started on trial of zyprexa and observed on the inpatient unit.He was also started on trial of zoloft for depression. He reported ongoing AH, so he was changed from zyprexa to haldol and dose was titrated up. He has been reporting gradual improvement of his presenting symptoms.  Today upon evaluation, pt shares, "I'm feeling a lot better. I'm still having the voices but  they are livable." Pt reports that AH remain minimal and non-intrusive.  Pt denies SI/HI/VH. He is sleeping well. His appetite is good. He feels safe to discharge to outpatient level of care. [facility] plans [facility]. He was able to engage in safety planning  including [facility] or contact emergency services if he feels unable to maintain his own safety or the safety of others. Pt had no further questions, comments, or concerns.   Plan Of Care/Follow-up recommendations:   -Discharge to outpatient level of care  -Schizophrenia -Continue Haldol 5mg  po qAM + 10mg  po qhs -Continue zoloft 50mg  po qDay  -Anxiety -Continue vistaril 50mg  po q6h prn anxiety  -EPS -Continue cogentin 1mg  po BID routinely for EPS  -insomnia -Continue trazodone 100 mg po qhs  Activity:  as tolerated Diet:  normal Tests:  NA Other:  see above for DC plan  Edwin Likenshristopher T Gerrell Tabet, MD 10/19/2017, 10:41 AM

## 2017-10-24 ENCOUNTER — Encounter (HOSPITAL_COMMUNITY): Payer: Self-pay

## 2017-10-24 ENCOUNTER — Emergency Department (HOSPITAL_COMMUNITY)
Admission: EM | Admit: 2017-10-24 | Discharge: 2017-10-25 | Disposition: A | Discharge status: Home / Self Care | Payer: Medicaid Other | Attending: Emergency Medicine | Admitting: Emergency Medicine

## 2017-10-24 DIAGNOSIS — F333 Major depressive disorder, recurrent, severe with psychotic symptoms: Secondary | ICD-10-CM | POA: Insufficient documentation

## 2017-10-24 DIAGNOSIS — R440 Auditory hallucinations: Secondary | ICD-10-CM | POA: Diagnosis present

## 2017-10-24 DIAGNOSIS — F17200 Nicotine dependence, unspecified, uncomplicated: Secondary | ICD-10-CM | POA: Insufficient documentation

## 2017-10-24 DIAGNOSIS — R443 Hallucinations, unspecified: Secondary | ICD-10-CM

## 2017-10-24 LAB — COMPREHENSIVE METABOLIC PANEL
ALT: 41 U/L (ref 0–44)
AST: 33 U/L (ref 15–41)
Albumin: 4.6 g/dL (ref 3.5–5.0)
Alkaline Phosphatase: 83 U/L (ref 38–126)
Anion gap: 14 (ref 5–15)
BUN: 11 mg/dL (ref 6–20)
CHLORIDE: 101 mmol/L (ref 98–111)
CO2: 23 mmol/L (ref 22–32)
Calcium: 9.4 mg/dL (ref 8.9–10.3)
Creatinine, Ser: 0.79 mg/dL (ref 0.61–1.24)
GFR calc Af Amer: >60 mL/min (ref >60)
GFR calc non Af Amer: >60 mL/min (ref >60)
GLUCOSE: 93 mg/dL (ref 70–99)
POTASSIUM: 4 mmol/L (ref 3.5–5.1)
Sodium: 138 mmol/L (ref 135–145)
Total Bilirubin: 0.7 mg/dL (ref 0.3–1.2)
Total Protein: 8.1 g/dL (ref 6.5–8.1)

## 2017-10-24 LAB — CBC
HCT: 48 % (ref 39.0–52.0)
Hemoglobin: 16.1 g/dL (ref 13.0–17.0)
MCH: 31 pg (ref 26.0–34.0)
MCHC: 33.5 g/dL (ref 30.0–36.0)
MCV: 92.5 fL (ref 78.0–100.0)
Platelets: 322 10*3/uL (ref 150–400)
RBC: 5.19 MIL/uL (ref 4.22–5.81)
RDW: 12.4 % (ref 11.5–15.5)
WBC: 8.9 10*3/uL (ref 4.0–10.5)

## 2017-10-24 LAB — RAPID URINE DRUG SCREEN, HOSP PERFORMED
AMPHETAMINES: NOT DETECTED
BARBITURATES: NOT DETECTED
BENZODIAZEPINES: NOT DETECTED
Cocaine: POSITIVE — AB
Opiates: NOT DETECTED
Tetrahydrocannabinol: POSITIVE — AB

## 2017-10-24 LAB — SALICYLATE LEVEL: Salicylate Lvl: <7 mg/dL (ref 2.8–30.0)

## 2017-10-24 LAB — ACETAMINOPHEN LEVEL: Acetaminophen (Tylenol), Serum: <10 ug/mL — ABNORMAL LOW (ref 10–30)

## 2017-10-24 LAB — ETHANOL: Alcohol, Ethyl (B): 195 mg/dL — ABNORMAL HIGH (ref <10)

## 2017-10-24 MED ORDER — HALOPERIDOL 5 MG PO TABS
5.0000 mg | ORAL_TABLET | Freq: Once | ORAL | Status: AC
  Administered 2017-10-24: 5 mg via ORAL
  Filled 2017-10-24: qty 1

## 2017-10-24 MED ORDER — ONDANSETRON HCL 4 MG PO TABS: 4.0000 mg | ORAL_TABLET | Freq: Three times a day (TID) | ORAL | Status: DC | PRN

## 2017-10-24 MED ORDER — ALUM & MAG HYDROXIDE-SIMETH 200-200-20 MG/5ML PO SUSP: 30.0000 mL | Freq: Four times a day (QID) | ORAL | Status: DC | PRN

## 2017-10-24 MED ORDER — HYDROXYZINE HCL 50 MG PO TABS: 50.0000 mg | ORAL_TABLET | Freq: Four times a day (QID) | ORAL | Status: DC | PRN

## 2017-10-24 MED ORDER — SERTRALINE HCL 50 MG PO TABS: 50.0000 mg | ORAL_TABLET | Freq: Every day | ORAL | Status: DC

## 2017-10-24 MED ORDER — IBUPROFEN 400 MG PO TABS: 600.0000 mg | ORAL_TABLET | Freq: Three times a day (TID) | ORAL | Status: DC | PRN

## 2017-10-24 MED ORDER — TRAZODONE HCL 100 MG PO TABS
100.0000 mg | ORAL_TABLET | Freq: Every day | ORAL | Status: DC
  Administered 2017-10-24: 100 mg via ORAL
  Filled 2017-10-24: qty 1

## 2017-10-24 MED ORDER — NICOTINE 21 MG/24HR TD PT24: 21.0000 mg | MEDICATED_PATCH | Freq: Every day | TRANSDERMAL | Status: DC

## 2017-10-24 MED ORDER — LORAZEPAM 2 MG/ML IJ SOLN
2.0000 mg | Freq: Once | INTRAMUSCULAR | Status: AC
  Administered 2017-10-24: 2 mg via INTRAMUSCULAR
  Filled 2017-10-24: qty 1

## 2017-10-24 MED ORDER — BENZTROPINE MESYLATE 1 MG PO TABS
1.0000 mg | ORAL_TABLET | Freq: Two times a day (BID) | ORAL | Status: DC
  Administered 2017-10-24: 1 mg via ORAL
  Filled 2017-10-24: qty 1

## 2017-10-24 MED ORDER — HYDROXYZINE HCL 25 MG PO TABS
50.0000 mg | ORAL_TABLET | Freq: Once | ORAL | Status: AC
  Administered 2017-10-24: 50 mg via ORAL
  Filled 2017-10-24: qty 2

## 2017-10-24 NOTE — ED Notes (Signed)
Patient belongings placed in locker 3

## 2017-10-24 NOTE — ED Triage Notes (Signed)
Pt presents for evaluation of ongoing auditory hallucinations. Reports he was d/c 3 days ago, was unable to fill his medication because of money. States he is hearing voices telling him to hurt himself.

## 2017-10-24 NOTE — ED Provider Notes (Signed)
[facility] Provider Note   CSN: [id number] Arrival date & time: 10/24/17  1826     History   Chief Complaint Chief Complaint  Patient presents with  . Hallucinations    HPI Edwin Henry is a 57 y.o. male with a past medical history of schizophrenia who presents the emergency department chief complaint of auditory hallucinations.  Patient was recently admitted for the same and discharged on 10/19/2017.  Patient states that he was unable to afford his medications.  He states that what he was being given while he was admitted was extremely helpful but since he was unable to afford his medicine he has been hearing command hallucinations that tell him to harm himself.  He states he began drinking to quiet the voices in his head.  He denies homicidal or suicidal ideation.  He does states "I just cannot take it anymore."  HPI  History reviewed. No pertinent past medical history.  Patient Active Problem List   Diagnosis Date Noted  . Schizophrenia, unspecified (HCC) 10/10/2017    History reviewed. No pertinent surgical history.      Home Medications    Prior to Admission medications   Medication Sig Start Date End Date Taking? Authorizing Provider  benztropine (COGENTIN) 1 MG tablet Take 1 tablet (1 mg total) by mouth 2 (two) times daily. For prevention of drug induced tremors 10/19/17  Yes Nwoko, Nicole KindredAgnes I, NP  haloperidol (HALDOL) 5 MG tablet Take 1 tablet (5 mg) by mouth in the morning & 2 tablet (10 mg) at bedtime: For mood control 10/19/17  Yes Armandina StammerNwoko, Agnes I, NP  hydrOXYzine (ATARAX/VISTARIL) 50 MG tablet Take 1 tablet (50 mg total) by mouth every 6 (six) hours as needed for anxiety. 10/19/17  Yes Nwoko, Nicole KindredAgnes I, NP  nicotine (NICODERM CQ - DOSED IN MG/24 HOURS) 21 mg/24hr patch Place 1 patch (21 mg total) onto the skin daily. (May buy from over the counter): For smoking cessation 10/20/17  Yes Armandina StammerNwoko, Agnes I, NP  traZODone (DESYREL) 100 MG  tablet Take 1 tablet (100 mg total) by mouth at bedtime. For sleep 10/19/17  Yes Armandina StammerNwoko, Agnes I, NP  sertraline (ZOLOFT) 50 MG tablet Take 1 tablet (50 mg total) by mouth daily. For depression 10/20/17   Sanjuana KavaNwoko, Agnes I, NP    Family History No family history on file.  Social History Social History   Tobacco Use  . Smoking status: Current Every Day Smoker    Packs/day: 0.50  . Smokeless tobacco: Never Used  Substance Use Topics  . Alcohol use: Yes    Comment: 1-2 beers every 2 days  . Drug use: Yes    Types: Marijuana    Comment: occasional THC use     Allergies   Patient has no known allergies.   Review of Systems Review of Systems Ten systems reviewed and are negative for acute change, except as noted in the HPI.    Physical Exam Updated Vital Signs BP (!) 164/112 (BP Location: Right Arm)   Pulse (!) 110   Temp 98.4 F (36.9 C) (Oral)   Resp 18   SpO2 98%   Physical Exam  Constitutional: He appears well-developed and well-nourished. No distress.  HENT:  Head: Normocephalic and atraumatic.  Eyes: Conjunctivae are normal. No scleral icterus.  Neck: Normal range of motion. Neck supple.  Cardiovascular: Normal rate, regular rhythm and normal heart sounds.  Pulmonary/Chest: Effort normal and breath sounds normal. No respiratory distress.  Abdominal: Soft.  There is no tenderness.  Musculoskeletal: He exhibits no edema.  Neurological: He is alert.  Skin: Skin is warm and dry. He is not diaphoretic.  Psychiatric: His behavior is normal.  Nursing note and vitals reviewed.    ED Treatments / Results  Labs (all labs ordered are listed, but only abnormal results are displayed) Labs Reviewed  ETHANOL - Abnormal; Notable for the following components:      Result Value   Alcohol, Ethyl (B) 195 (*)    All other components within normal limits  ACETAMINOPHEN LEVEL - Abnormal; Notable for the following components:   Acetaminophen (Tylenol), Serum <10 (*)    All other  components within normal limits  RAPID URINE DRUG SCREEN, HOSP PERFORMED - Abnormal; Notable for the following components:   Cocaine POSITIVE (*)    Tetrahydrocannabinol POSITIVE (*)    All other components within normal limits  COMPREHENSIVE METABOLIC PANEL  SALICYLATE LEVEL  CBC    EKG None  Radiology No results found.  Procedures Procedures (including critical care time)  Medications Ordered in ED Medications  hydrOXYzine (ATARAX/VISTARIL) tablet 50 mg (50 mg Oral Given 10/24/17 1919)  haloperidol (HALDOL) tablet 5 mg (5 mg Oral Given 10/24/17 1919)  LORazepam (ATIVAN) injection 2 mg (2 mg Intramuscular Given 10/24/17 2036)     Initial Impression / Assessment and Plan / ED Course  I have reviewed the triage vital signs and the nursing notes.  Pertinent labs & imaging results that were available during my care of the patient were reviewed by me and considered in my medical decision making (see chart for details).     Recent has outpatient psychiatric follow-up in the morning.  He has been evaluated by TTS and recommended for discharge.  He is currently sleeping and will be discharged at the nurses discretion.  He is medically clear.  Final Clinical Impressions(s) / ED Diagnoses   Final diagnoses:  Auditory hallucination  Hallucinations    ED Discharge Orders    None       Arthor Captain, PA-C 10/25/17 0125    Tegeler, Canary Brim, MD 10/25/17 [phone]

## 2017-10-24 NOTE — ED Provider Notes (Signed)
Patient placed in Quick Look pathway, seen and evaluated   Chief Complaint: Auditory hallucinations  HPI: Patient with a history of schizophrenia presents for evaluation of ongoing auditory hallucinations.  He was discharged from the hospital 3 days ago for the same.  States that he is homeless and has been unable to fill his medications because of cost issues.  The voices in his head are telling him to hurt himself.  He reports that he is very anxious and is asking for medication to help with his anxiety and to help him sleep.   ROS: + hallucinations  Physical Exam:   Gen: Appears anxious, tearfil  Neuro: Awake and Alert  Skin: Warm    Focused Exam: Heart RRR. Normal gait.    Initiation of care has begun. The patient has been counseled on the process, plan, and necessity for staying for the completion/evaluation, and the remainder of the medical screening examination    Edwin Henry, Edwin  J, PA-C 10/24/17 1906    Mesner, Barbara CowerJason, MD 10/25/17 [phone]

## 2017-10-25 ENCOUNTER — Emergency Department (HOSPITAL_COMMUNITY)
Admission: EM | Admit: 2017-10-25 | Discharge: 2017-10-26 | Discharge status: Against Medical Advice | Payer: Medicaid Other | Source: Home / Self Care | Attending: Emergency Medicine | Admitting: Emergency Medicine

## 2017-10-25 ENCOUNTER — Other Ambulatory Visit: Payer: Self-pay

## 2017-10-25 ENCOUNTER — Encounter (HOSPITAL_COMMUNITY): Payer: Self-pay

## 2017-10-25 DIAGNOSIS — F39 Unspecified mood [affective] disorder: Secondary | ICD-10-CM

## 2017-10-25 DIAGNOSIS — R440 Auditory hallucinations: Secondary | ICD-10-CM

## 2017-10-25 DIAGNOSIS — R443 Hallucinations, unspecified: Secondary | ICD-10-CM

## 2017-10-25 MED ORDER — HALOPERIDOL LACTATE 5 MG/ML IJ SOLN
5.0000 mg | Freq: Once | INTRAMUSCULAR | Status: AC
  Administered 2017-10-25: 5 mg via INTRAMUSCULAR
  Filled 2017-10-25: qty 1

## 2017-10-25 MED ORDER — IBUPROFEN 400 MG PO TABS
600.0000 mg | ORAL_TABLET | Freq: Three times a day (TID) | ORAL | Status: DC | PRN
  Administered 2017-10-26 (×2): 600 mg via ORAL
  Filled 2017-10-25 (×2): qty 1

## 2017-10-25 MED ORDER — ALUM & MAG HYDROXIDE-SIMETH 200-200-20 MG/5ML PO SUSP: 30.0000 mL | Freq: Four times a day (QID) | ORAL | Status: DC | PRN

## 2017-10-25 MED ORDER — ONDANSETRON HCL 4 MG PO TABS: 4.0000 mg | ORAL_TABLET | Freq: Three times a day (TID) | ORAL | Status: DC | PRN

## 2017-10-25 NOTE — BH Assessment (Addendum)
Tele Assessment Note   Patient Name: Edwin Henry MRN: [id number] Referring Physician: Irven Easterly Tegeler Location of Patient: [facility] Location of Provider: Behavioral Health TTS Department  Edwin Henry is an 57 y.o., separated male. Pt presented to [facility] due to reports of hallucinations. Pt reports that he is unsure of why he presented, but stated, "Some lady on the bus brought me." Pt stated that he hears voices and stated, "They come and go." Pt denied SI/HI. Pt reported that he drinks 1-2 12oz beers daily. Pt reported last use 10/24/2017. Pt reported depressive symptoms including: worthlessness. When asked several probing questions, pt did not respond. Pt stated, "This is not a good time." Pt was seen shaking his head, which was an indication that he was hearing voices during assessment. When asked if pt has been diagnosed with a mental health disorder, pt answered, "no." Pt seemed to be confused during assessment and at times unresponsive.   Pt reports that he is homeless and has no supports. Pt stated that he receives SSI. Pt denied hx of abuse and recent trauma. Pt denied current stressors.   Pt presented calm, but at times unresponsive to questions. Pt was only oriented to his name and date of birth. Pt was not able to identify where he was or the date. Pt presented with flat affect. Pt presented with slurred, at times incoherent,  slightly disorganized speech. Pt presented with poor insight, poor judgement and impaired cognition. Pt made no eye contact during session.   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 No pertinent past medical history.  History reviewed. No pertinent surgical history.  Family History: No family history on file.  Social History:  reports that he has been smoking. He has been smoking about 0.50 packs per day. He has never used smokeless tobacco. He reports that he drinks alcohol. He  reports that he has current or past drug history. Drug: Marijuana.  Additional Social History:  Alcohol / Drug Use Pain Medications: See MAR Prescriptions: See MAR Over the Counter: See MAR History of alcohol / drug use?: Yes Longest period of sobriety (when/how long): Unsure. Pt did not answer question.  Substance #1 Name of Substance 1: Alcohol  1 - Age of First Use: 15 1 - Amount (size/oz): 1-2 12oz Beers 1 - Frequency: Daily  1 - Duration: 40 years 1 - Last Use / Amount: 10/24/2017  CIWA: CIWA-Ar BP: (!) 142/79 Pulse Rate: 88 COWS:    Allergies: No Known Allergies  Home Medications:  (Not in a hospital admission)  OB/GYN Status:  No LMP for male patient.  General Assessment Data Location of Assessment: [facility] ED TTS Assessment: In system Is this a Tele or Face-to-Face Assessment?: Tele Assessment Is this an Initial Assessment or a Re-assessment for this encounter?: Initial Assessment Patient Accompanied by:: N/A Language Other than English: No Living Arrangements: Homeless/Shelter What gender do you identify as?: Male Marital status: Separated Maiden name: N/A Pregnancy Status: No Living Arrangements: Alone Can pt return to current living arrangement?: No(Pt reportedly homeless. ) Admission Status: Voluntary Is patient capable of signing voluntary admission?: Yes Referral Source: Self/Family/Friend Insurance type: Medicaid  Medical Screening Exam [facility] Walk-in ONLY) Medical Exam completed: Yes  Crisis Care Plan Living Arrangements: Alone Legal Guardian: (N/A) Name of Psychiatrist: Denies Name of Therapist: Denies  Education Status Is patient currently in school?: No Highest grade of school patient has completed: N/A Is the patient employed, unemployed or receiving disability?:  Receiving disability income  Risk to self with the past 6 months Suicidal Ideation: No-Not Currently/Within Last 6 Months Has patient been a risk to self within the past 6 months  prior to admission? : Yes Suicidal Plan?: No-Not Currently/Within Last 6 Months Has patient had any suicidal plan within the past 6 months prior to admission? : No Access to Means: No What has been your use of drugs/alcohol within the last 12 months?: Alcohol Previous Attempts/Gestures: No(Pt did not answer. ) How many times?: 0 Other Self Harm Risks: Denies Triggers for Past Attempts: None known Intentional Self Injurious Behavior: None Family Suicide History: No Recent stressful life event(s): Other (Comment)(Pt did not report. ) Persecutory voices/beliefs?: Yes(Pt reports AH. ) Depression: Yes Depression Symptoms: Loss of interest in usual pleasures Substance abuse history and/or treatment for substance abuse?: Yes Suicide prevention information given to non-admitted patients: Not applicable  Risk to Others within the past 6 months Homicidal Ideation: No Does patient have any lifetime risk of violence toward others beyond the six months prior to admission? : No Thoughts of Harm to Others: No Current Homicidal Intent: No Current Homicidal Plan: No Access to Homicidal Means: No Identified Victim: Denies History of harm to others?: No Assessment of Violence: None Noted Violent Behavior Description: Denies Does patient have access to weapons?: No Criminal Charges Pending?: No Does patient have a court date: No Is patient on probation?: No  Psychosis Hallucinations: Auditory, With command Delusions: None noted  Mental Status Report Appearance/Hygiene: In scrubs Eye Contact: Poor Motor Activity: Unremarkable Speech: Slurred Level of Consciousness: Drowsy Mood: Empty Affect: Flat Anxiety Level: None Thought Processes: Flight of Ideas Judgement: Impaired Orientation: Person Obsessive Compulsive Thoughts/Behaviors: None  Cognitive Functioning Concentration: Poor Memory: Recent Impaired, Remote Impaired Is patient IDD: No Insight: Poor Impulse Control:  Poor Appetite: Fair Have you had any weight changes? : No Change Amount of the weight change? (lbs): 0 lbs Sleep: No Change Total Hours of Sleep: 9 Vegetative Symptoms: None  ADLScreening [facility] Assessment Services) Patient's cognitive ability adequate to safely complete daily activities?: Yes Patient able to express need for assistance with ADLs?: Yes Independently performs ADLs?: Yes (appropriate for developmental age)  Prior Inpatient Therapy Prior Inpatient Therapy: Yes Prior Therapy Dates: Discharged from hospital 10/22/2017. Prior Therapy Facilty/Provider(s): facilities in [place] Reason for Treatment: depression/psychosis  Prior Outpatient Therapy Prior Outpatient Therapy: No Does patient have an ACCT team?: No Does patient have Intensive In-House Services?  : No Does patient have [facility] services? : No Does patient have P4CC services?: No  ADL Screening (condition at time of admission) Patient's cognitive ability adequate to safely complete daily activities?: Yes Is the patient deaf or have difficulty hearing?: No Does the patient have difficulty seeing, even when wearing glasses/contacts?: No Does the patient have difficulty concentrating, remembering, or making decisions?: Yes Patient able to express need for assistance with ADLs?: Yes Does the patient have difficulty dressing or bathing?: No Independently performs ADLs?: Yes (appropriate for developmental age) Does the patient have difficulty walking or climbing stairs?: No Weakness of Legs: None Weakness of Arms/Hands: None  Home Assistive Devices/Equipment Home Assistive Devices/Equipment: None  Therapy Consults (therapy consults require a physician order) PT Evaluation Needed: No OT Evalulation Needed: No SLP Evaluation Needed: No Abuse/Neglect Assessment (Assessment to be complete while patient is alone) Abuse/Neglect Assessment Can Be Completed: Yes Physical Abuse: Denies Verbal Abuse: Denies Sexual  Abuse: Denies Exploitation of patient/patient's resources: Denies Self-Neglect: Denies Values / Beliefs Cultural Requests During Hospitalization: None  Spiritual Requests During Hospitalization: None Consults Spiritual Care Consult Needed: No Social Work Consult Needed: No Merchant navy officerAdvance Directives (For Healthcare) Does Patient Have a Medical Advance Directive?: No Would patient like information on creating a medical advance directive?: No - Patient declined          Disposition: Per Nira ConnJason Berry, NP, Pt to be held until morning and discharged. Pt has follow up appointment with [facility] for medication management. Nurse, Gabriel RungMonique informed of the disposition.  Disposition Initial Assessment Completed for this Encounter: Yes Patient referred to: Other (Comment)([facility])  This service was provided via telemedicine using a 2-way, interactive audio and video technology.  Names of all persons participating in this telemedicine service and their role in this encounter. Name: Edwin Henry Role: Patient  Name: Chesley NoonMiriam Maven Rosander Role: Assessor  Name: [name] Role: Nurse   Name:  Role:     Chesley NoonMiriam Humbert Morozov, M.S., [facility], LCAS Triage Specialist [facility] 10/25/2017 12:55 AM
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
## 2017-10-25 NOTE — Progress Notes (Signed)
Patient to Follow up at:    Follow-up Information    [facility] up on 10/25/2017.   Specialty:  Family Medicine Why:  Thursday at 9:15 with Constance Hawierra G., and then 11:00 same day with Arn MedalPatricia Adams.  Contact information: [address] [place] [address] [phone]

## 2017-10-25 NOTE — ED Notes (Signed)
Pt given clothes from locked storage.

## 2017-10-25 NOTE — Discharge Instructions (Signed)
Contact a health care provider if: Medicines do not seem to be helping. The person hears voices telling him or her to do things. The person continues to see, smell, or feel things that are not there. The person feels extremely fearful and suspicious that someone or something will harm him or her. The person feels unable to leave his or her house. The person has trouble taking care of himself or herself. The person experiences side effects of medicines, such as: Changes in sleep patterns. Dizziness. Weight gain. Restlessness. Movement changes. Muscle spasms. Tremors. Get help right away if: Serious thoughts occur about self-harm or about hurting others. There are serious side effects of medicine, such as: Swelling of the face, lips, tongue, or throat. Fever, confusion, muscle spasms, or seizures.

## 2017-10-25 NOTE — ED Notes (Signed)
Pt discharged from ED; instructions provided; Pt encouraged to return to ED if symptoms worsen and to f/u with PCP; Pt verbalized understanding of all instructions 

## 2017-10-25 NOTE — ED Provider Notes (Signed)
[facility] EMERGENCY DEPARTMENT Provider Note   CSN: [id number] Arrival date & time: 10/25/17  1325     History   Chief Complaint Chief Complaint  Patient presents with  . Psychiatric Evaluation    HPI Edwin Henry is a 57 y.o. male.  The history is provided by the patient. No language interpreter was used.   Edwin Henry is a 57 y.o. male who presents to the Emergency Department complaining of psych eval. Resents to the emergency department for psychiatric eval. He reports auditory hallucinations telling to cut himself. He was seen in the emergency department and received psychiatric medications and was discharged home. He self presents again today due to ongoing hallucinations and inability to afford his medications. He denies any recent illnesses. He states he has been off all of his medications for one week. He states refills will not help because he cannot pay for medications. Symptoms are moderate and constant nature. History reviewed. No pertinent past medical history.  Patient Active Problem List   Diagnosis Date Noted  . Schizophrenia, unspecified (HCC) 10/10/2017    Past Surgical History:  Procedure Laterality Date  . KNEE SURGERY          Home Medications    Prior to Admission medications   Medication Sig Start Date End Date Taking? Authorizing Provider  benztropine (COGENTIN) 1 MG tablet Take 1 tablet (1 mg total) by mouth 2 (two) times daily. For prevention of drug induced tremors 10/19/17   Armandina Stammer I, NP  haloperidol (HALDOL) 5 MG tablet Take 1 tablet (5 mg) by mouth in the morning & 2 tablet (10 mg) at bedtime: For mood control 10/19/17   Armandina Stammer I, NP  hydrOXYzine (ATARAX/VISTARIL) 50 MG tablet Take 1 tablet (50 mg total) by mouth every 6 (six) hours as needed for anxiety. 10/19/17   Armandina Stammer I, NP  nicotine (NICODERM CQ - DOSED IN MG/24 HOURS) 21 mg/24hr patch Place 1 patch (21 mg total) onto the skin daily. (May buy from  over the counter): For smoking cessation 10/20/17   Armandina Stammer I, NP  sertraline (ZOLOFT) 50 MG tablet Take 1 tablet (50 mg total) by mouth daily. For depression 10/20/17   Armandina Stammer I, NP  traZODone (DESYREL) 100 MG tablet Take 1 tablet (100 mg total) by mouth at bedtime. For sleep 10/19/17   Sanjuana Kava, NP    Family History History reviewed. No pertinent family history.  Social History Social History   Tobacco Use  . Smoking status: Current Every Day Smoker    Packs/day: 0.50  . Smokeless tobacco: Never Used  Substance Use Topics  . Alcohol use: Yes    Comment: 1-2 beers every 2 days  . Drug use: Yes    Types: Marijuana    Comment: occasional THC use     Allergies   Patient has no known allergies.   Review of Systems Review of Systems  All other systems reviewed and are negative.    Physical Exam Updated Vital Signs BP 138/76 (BP Location: Right Arm)   Pulse 70   Temp 98.5 F (36.9 C) (Oral)   Resp 16   SpO2 100%   Physical Exam  Constitutional: He is oriented to person, place, and time. He appears well-developed and well-nourished.  HENT:  Head: Normocephalic and atraumatic.  Cardiovascular: Normal rate and regular rhythm.  No murmur heard. Pulmonary/Chest: Effort normal and breath sounds normal. No respiratory distress.  Abdominal: Soft. There is no tenderness.  There is no rebound and no guarding.  Musculoskeletal: Normal range of motion.  Neurological: He is alert and oriented to person, place, and time.  Skin: Skin is warm.  Psychiatric:  Mildly agitated but easily calmed and redirected.  Nursing note and vitals reviewed.    ED Treatments / Results  Labs (all labs ordered are listed, but only abnormal results are displayed) Labs Reviewed - No data to display  EKG None  Radiology No results found.  Procedures Procedures (including critical care time)  Medications Ordered in ED Medications  ibuprofen (ADVIL,MOTRIN) tablet 600 mg  (has no administration in time range)  ondansetron (ZOFRAN) tablet 4 mg (has no administration in time range)  alum & mag hydroxide-simeth (MAALOX/MYLANTA) 200-200-20 MG/5ML suspension 30 mL (has no administration in time range)  haloperidol lactate (HALDOL) injection 5 mg (5 mg Intramuscular Given 10/25/17 1814)     Initial Impression / Assessment and Plan / ED Course  I have reviewed the triage vital signs and the nursing notes.  Pertinent labs & imaging results that were available during my care of the patient were reviewed by me and considered in my medical decision making (see chart for details).     Patient here for psychiatric evaluation for auditory hallucinations and SI. He is mildly agitated but calm and redirectable. He has been medically cleared for psychiatric evaluation and treatment. Please refer to labs were drawn yesterday, no need for repeat labs today.  Final Clinical Impressions(s) / ED Diagnoses   Final diagnoses:  None    ED Discharge Orders    None       Tilden Fossaees, Aleasha Fregeau, MD 10/26/17 0045

## 2017-10-25 NOTE — ED Notes (Signed)
Pt sleeping at this time.

## 2017-10-25 NOTE — BH Assessment (Addendum)
Tele Assessment Note   Patient Name: Edwin Henry MRN: [id number] Referring Physician: Madilyn Hook Location of Patient: [place] Location of Provider: Behavioral Health TTS Department  Burr Soffer is an 57 y.o. male.  The pt came in due to hearing voices to kill himself.  The pt was discharged from [facility] 10/19/2017.  He was seen by TTS 10/24/17 and was instructed to go to [facility] today.  The pt stated he went there and he was instructed to come back to the hospital.  The pt stated he hears voices that tell him to cut his wrist and has been hearing voices for the past 6 months.  The pt denies ever trying to kill himself in the past.  Today was the pt's first visit to [facility]. The pt has had other hospitalization in the past in [place], [place].  The pt is currently homeless.  He denies any recent or past self harm.  The pt denies HI, legal issues and abuse.  The pt reports he is sleeping 3-4 hours a night.  He has a poor appetite.  The pt reports he is drinking 1-2 12 oz beers and used cocaine and marijuana.  The last time he reports using these substances was Labor Day.  His UDS yesterday was positive for cocaine and marijuana.  His blood alcohol level was 195.  Pt is dressed in scrubs. He is alert and oriented x4. Pt speaks in a clear tone, at moderate volume and normal pace. Eye contact is good. Pt's mood is depressed. Thought process is coherent and relevant. There is no indication Pt is currently responding to internal stimuli or experiencing delusional thought content.?Pt was cooperative throughout assessment.           Diagnosis: F25.1 Schizoaffective disorder, Depressive type F10.20 Alcohol use disorder, Severe F14.20 Cocaine use disorder, Severe F12.20 Cannabis use disorder, Moderate   Past Medical History: History reviewed. No pertinent past medical history.  Past Surgical History:  Procedure Laterality Date  . KNEE SURGERY      Family History: History reviewed. No  pertinent family history.  Social History:  reports that he has been smoking. He has been smoking about 0.50 packs per day. He has never used smokeless tobacco. He reports that he drinks alcohol. He reports that he has current or past drug history. Drug: Marijuana.  Additional Social History:  Alcohol / Drug Use Pain Medications: See MAR Prescriptions: See MAR Over the Counter: See MAR History of alcohol / drug use?: Yes Longest period of sobriety (when/how long): unknown Substance #1 Name of Substance 1: alcohol 1 - Age of First Use: 15 1 - Amount (size/oz): 1-2 40 oz beers 1 - Frequency: pt states "not often" 1 - Duration: 42 years 1 - Last Use / Amount: "Labor Day Weekend" Substance #2 Name of Substance 2: cocaine 2 - Age of First Use: unknown 2 - Amount (size/oz): unknown 2 - Frequency: pt states "not often" 2 - Duration: unknown 2 - Last Use / Amount: "Labor Day Weekend" Substance #3 Name of Substance 3: Marijuana 3 - Age of First Use: unknown 3 - Amount (size/oz): unknown 3 - Frequency: "not often" 3 - Duration: unknown 3 - Last Use / Amount: "Labor Day Weekend"  CIWA: CIWA-Ar BP: (!) 161/86 Pulse Rate: 75 COWS:    Allergies: No Known Allergies  Home Medications:  (Not in a hospital admission)  OB/GYN Status:  No LMP for male patient.  General Assessment Data [facility] of Assessment: [facility]  ED TTS Assessment: In system Is this a Tele or Face-to-Face Assessment?: Face-to-Face Is this an Initial Assessment or a Re-assessment for this encounter?: Initial Assessment Patient Accompanied by:: N/A Language Other than English: No Living Arrangements: Homeless/Shelter What gender do you identify as?: Male Marital status: Separated Maiden name: NA Pregnancy Status: Other (Comment)(Cisgendered male) Living Arrangements: Alone Can pt return to current living arrangement?: Yes Admission Status: Voluntary Is patient capable of signing voluntary admission?: Yes Referral  Source: Self/Family/Friend Insurance type: Self      Crisis Care Plan Living Arrangements: Alone Legal Guardian: Other:(Self) Name of Psychiatrist: Denies Name of Therapist: Denies  Education Status Is patient currently in school?: No Highest grade of school patient has completed: N/A Is the patient employed, unemployed or receiving disability?: Receiving disability income  Risk to self with the past 6 months Suicidal Ideation: Yes-Currently Present Has patient been a risk to self within the past 6 months prior to admission? : Yes Suicidal Intent: No Has patient had any suicidal intent within the past 6 months prior to admission? : No Is patient at risk for suicide?: Yes Suicidal Plan?: Yes-Currently Present Has patient had any suicidal plan within the past 6 months prior to admission? : No Specify Current Suicidal Plan: cut wrist Access to Means: No What has been your use of drugs/alcohol within the last 12 months?: alcohol Previous Attempts/Gestures: No How many times?: 0 Other Self Harm Risks: denies Triggers for Past Attempts: None known Intentional Self Injurious Behavior: None Family Suicide History: No Recent stressful life event(s): Financial Problems, Other (Comment)(hallucinations) Persecutory voices/beliefs?: Yes Depression: Yes Depression Symptoms: Loss of interest in usual pleasures Substance abuse history and/or treatment for substance abuse?: Yes Suicide prevention information given to non-admitted patients: Yes  Risk to Others within the past 6 months Homicidal Ideation: No Does patient have any lifetime risk of violence toward others beyond the six months prior to admission? : No Thoughts of Harm to Others: No Current Homicidal Intent: No Current Homicidal Plan: No Access to Homicidal Means: No Identified Victim: NA History of harm to others?: No Assessment of Violence: None Noted Violent Behavior Description: denies Does patient have access to  weapons?: No Criminal Charges Pending?: No Does patient have a court date: No Is patient on probation?: No  Psychosis Hallucinations: Auditory, Visual Delusions: None noted  Mental Status Report Appearance/Hygiene: In scrubs Eye Contact: Good Motor Activity: Freedom of movement, Unremarkable Speech: Logical/coherent Level of Consciousness: Alert Mood: Depressed Affect: Depressed Anxiety Level: None Thought Processes: Coherent, Relevant Judgement: Impaired Orientation: Person, Place, Time, Situation Obsessive Compulsive Thoughts/Behaviors: None  Cognitive Functioning Concentration: Normal Memory: Recent Intact, Remote Intact Is patient IDD: No Insight: Poor Impulse Control: Poor Appetite: Poor Have you had any weight changes? : No Change Amount of the weight change? (lbs): 0 lbs Sleep: Decreased Total Hours of Sleep: 3 Vegetative Symptoms: None  ADLScreening [facility] Assessment Services) Patient's cognitive ability adequate to safely complete daily activities?: Yes Patient able to express need for assistance with ADLs?: Yes Independently performs ADLs?: Yes (appropriate for developmental age)  Prior Inpatient Therapy Prior Inpatient Therapy: Yes Prior Therapy Dates: Discharged from hospital 10/22/2017. Prior Therapy Facilty/Provider(s): facilities in [place], and [facility] Reason for Treatment: depression/psychosis  Prior Outpatient Therapy Prior Outpatient Therapy: Yes Prior Therapy Dates: current Prior Therapy Facilty/Provider([facility]): [name] Reason for Treatment: psychosis Does patient have an ACCT team?: No Does patient have Intensive In-House Services?  : No Does patient have [facility] services? : No Does patient have P4CC  services?: No  ADL Screening (condition at time of admission) Patient's cognitive ability adequate to safely complete daily activities?: Yes Patient able to express need for assistance with ADLs?: Yes Independently performs ADLs?: Yes  (appropriate for developmental age)       Abuse/Neglect Assessment (Assessment to be complete while patient is alone) Abuse/Neglect Assessment Can Be Completed: Yes Physical Abuse: Denies Verbal Abuse: Denies Sexual Abuse: Denies Exploitation of patient/patient's resources: Denies Self-Neglect: Denies Values / Beliefs Cultural Requests During Hospitalization: None Spiritual Requests During Hospitalization: None Consults Spiritual Care Consult Needed: No Social Work Consult Needed: No            Disposition:  Disposition Initial Assessment Completed for this Encounter: Yes  NP Nira ConnJason Berry recommends the pt be observed overnight for safety and stabilization.  The pt is to be reassessed by psychiatry in the morning. RN Drue FlirtCandy was made aware of the recommendation.  This service was provided via telemedicine using a 2-way, interactive audio and video technology.  Names of all persons participating in this telemedicine service and their role in this encounter. Name: Merryl HackerBilly Rew Role: Pt  Name: Riley ChurchesKendall Robel Wuertz Role: TTS  Name:  Role:   Name:  Role:     Ottis StainGarvin, Gerson Fauth Jermaine 10/25/2017 6:44 PM

## 2017-10-25 NOTE — ED Notes (Signed)
Pt has labs that were ordered at 1604. Spoke with previous RN and states that MD stated that since labs were drawn yesterday, he didn't need them. Spoke with Dr. Madilyn Hookees about the labs, states that [facility], we can cancel them. Spoke with Riley ChurchesKendall Garvin, [facility], and states that we are going to observe and reassess pt in the am and he should be fine without the labs.

## 2017-10-25 NOTE — ED Notes (Signed)
Patient belongings inventoried and placed in locker 3-[name],RN  

## 2017-10-25 NOTE — ED Triage Notes (Signed)
Pt presents with 6 month h/o auditory and visual hallucinations.  Pt reports hearing voices that tell him to hurt himself, pt reports seeing demons and spirits.  Pt was seen here for same last night, pt followed up at Behavior Health this morning with same feeling and instructed to return here.

## 2017-10-25 NOTE — ED Notes (Signed)
TTS in process-[name],RN  

## 2017-10-25 NOTE — ED Notes (Signed)
[facility] representative in room

## 2017-10-25 NOTE — ED Notes (Signed)
Patient to be d/c after breakfast for am Psy appointment; [facility]; [name], PA advised of [facility] dispo; d/c paperwork is ready for d/c in the am-[name],RN

## 2017-10-25 NOTE — ED Notes (Signed)
Belongings at desk in triage

## 2017-10-25 NOTE — ED Notes (Signed)
Dinner tray at bedside

## 2017-10-25 NOTE — ED Notes (Signed)
Pt gave me his phone to charge, placed in Pod A in modem across from room 9.

## 2017-10-25 NOTE — ED Notes (Signed)
Pt belongings placed in locker 3 

## 2017-10-26 ENCOUNTER — Emergency Department (HOSPITAL_COMMUNITY)
Admission: EM | Admit: 2017-10-26 | Discharge: 2017-10-27 | Disposition: A | Discharge status: Home / Self Care | Payer: Medicaid Other | Attending: Emergency Medicine | Admitting: Emergency Medicine

## 2017-10-26 ENCOUNTER — Encounter (HOSPITAL_COMMUNITY): Payer: Self-pay | Admitting: Registered Nurse

## 2017-10-26 ENCOUNTER — Encounter (HOSPITAL_COMMUNITY): Payer: Self-pay | Admitting: Emergency Medicine

## 2017-10-26 ENCOUNTER — Emergency Department (HOSPITAL_COMMUNITY): Payer: Medicaid Other

## 2017-10-26 ENCOUNTER — Other Ambulatory Visit: Payer: Self-pay

## 2017-10-26 DIAGNOSIS — R440 Auditory hallucinations: Secondary | ICD-10-CM | POA: Diagnosis present

## 2017-10-26 DIAGNOSIS — R443 Hallucinations, unspecified: Secondary | ICD-10-CM | POA: Diagnosis present

## 2017-10-26 DIAGNOSIS — F17200 Nicotine dependence, unspecified, uncomplicated: Secondary | ICD-10-CM | POA: Diagnosis not present

## 2017-10-26 DIAGNOSIS — R45851 Suicidal ideations: Secondary | ICD-10-CM | POA: Diagnosis not present

## 2017-10-26 DIAGNOSIS — F209 Schizophrenia, unspecified: Secondary | ICD-10-CM | POA: Diagnosis not present

## 2017-10-26 LAB — CBC WITH DIFFERENTIAL/PLATELET
BASOS ABS: 0 10*3/uL (ref 0.0–0.1)
Basophils Relative: 0 %
EOS PCT: 2 %
Eosinophils Absolute: 0.2 10*3/uL (ref 0.0–0.7)
HCT: 43.6 % (ref 39.0–52.0)
Hemoglobin: 14.6 g/dL (ref 13.0–17.0)
LYMPHS PCT: 32 %
Lymphs Abs: 3.3 10*3/uL (ref 0.7–4.0)
MCH: 31.7 pg (ref 26.0–34.0)
MCHC: 33.5 g/dL (ref 30.0–36.0)
MCV: 94.6 fL (ref 78.0–100.0)
Monocytes Absolute: 0.9 10*3/uL (ref 0.1–1.0)
Monocytes Relative: 8 %
NEUTROS ABS: 6 10*3/uL (ref 1.7–7.7)
NEUTROS PCT: 58 %
PLATELETS: 278 10*3/uL (ref 150–400)
RBC: 4.61 MIL/uL (ref 4.22–5.81)
RDW: 12.7 % (ref 11.5–15.5)
WBC: 10.4 10*3/uL (ref 4.0–10.5)

## 2017-10-26 LAB — BASIC METABOLIC PANEL
ANION GAP: 12 (ref 5–15)
BUN: 13 mg/dL (ref 6–20)
CO2: 24 mmol/L (ref 22–32)
Calcium: 9.2 mg/dL (ref 8.9–10.3)
Chloride: 102 mmol/L (ref 98–111)
Creatinine, Ser: 0.72 mg/dL (ref 0.61–1.24)
GFR calc Af Amer: >60 mL/min (ref >60)
GLUCOSE: 109 mg/dL — AB (ref 70–99)
POTASSIUM: 3.8 mmol/L (ref 3.5–5.1)
Sodium: 138 mmol/L (ref 135–145)

## 2017-10-26 LAB — ACETAMINOPHEN LEVEL: Acetaminophen (Tylenol), Serum: <10 ug/mL — ABNORMAL LOW (ref 10–30)

## 2017-10-26 LAB — ETHANOL: ALCOHOL ETHYL (B): 116 mg/dL — AB (ref <10)

## 2017-10-26 LAB — RAPID URINE DRUG SCREEN, HOSP PERFORMED
AMPHETAMINES: NOT DETECTED
Barbiturates: NOT DETECTED
Benzodiazepines: NOT DETECTED
Cocaine: NOT DETECTED
OPIATES: NOT DETECTED
Tetrahydrocannabinol: NOT DETECTED

## 2017-10-26 LAB — SALICYLATE LEVEL

## 2017-10-26 MED ORDER — HALOPERIDOL 5 MG PO TABS
10.0000 mg | ORAL_TABLET | Freq: Once | ORAL | Status: AC
  Administered 2017-10-26: 10 mg via ORAL
  Filled 2017-10-26: qty 2

## 2017-10-26 MED ORDER — BENZTROPINE MESYLATE 1 MG/ML IJ SOLN
1.0000 mg | INTRAMUSCULAR | Status: AC
  Administered 2017-10-26: 1 mg via INTRAMUSCULAR
  Filled 2017-10-26: qty 2

## 2017-10-26 MED ORDER — SERTRALINE HCL 50 MG PO TABS
50.0000 mg | ORAL_TABLET | ORAL | Status: AC
  Administered 2017-10-26: 50 mg via ORAL
  Filled 2017-10-26: qty 7
  Filled 2017-10-26: qty 1

## 2017-10-26 MED ORDER — BENZTROPINE MESYLATE 1 MG PO TABS
1.0000 mg | ORAL_TABLET | Freq: Once | ORAL | Status: AC
  Administered 2017-10-26: 1 mg via ORAL
  Filled 2017-10-26: qty 1

## 2017-10-26 MED ORDER — SERTRALINE HCL 50 MG PO TABS
50.0000 mg | ORAL_TABLET | Freq: Every day | ORAL | Status: DC
  Administered 2017-10-27: 50 mg via ORAL
  Filled 2017-10-26: qty 1

## 2017-10-26 MED ORDER — IBUPROFEN 800 MG PO TABS
800.0000 mg | ORAL_TABLET | Freq: Once | ORAL | Status: AC
  Administered 2017-10-26: 800 mg via ORAL
  Filled 2017-10-26: qty 1

## 2017-10-26 MED ORDER — BENZTROPINE MESYLATE 1 MG PO TABS
1.0000 mg | ORAL_TABLET | Freq: Two times a day (BID) | ORAL | Status: DC
  Administered 2017-10-27: 1 mg via ORAL
  Filled 2017-10-26: qty 1

## 2017-10-26 MED ORDER — HALOPERIDOL 5 MG PO TABS
10.0000 mg | ORAL_TABLET | Freq: Every day | ORAL | Status: DC
  Administered 2017-10-26: 10 mg via ORAL
  Filled 2017-10-26: qty 2
  Filled 2017-10-26: qty 14

## 2017-10-26 MED ORDER — HALOPERIDOL 5 MG PO TABS
5.0000 mg | ORAL_TABLET | Freq: Two times a day (BID) | ORAL | Status: DC
  Administered 2017-10-27: 5 mg via ORAL
  Filled 2017-10-26: qty 1

## 2017-10-26 MED ORDER — HALOPERIDOL DECANOATE 100 MG/ML IM SOLN
50.0000 mg | INTRAMUSCULAR | Status: DC
  Administered 2017-10-26: 50 mg via INTRAMUSCULAR
  Filled 2017-10-26: qty 0.5

## 2017-10-26 MED ORDER — NICOTINE 21 MG/24HR TD PT24: 21.0000 mg | MEDICATED_PATCH | Freq: Once | TRANSDERMAL | Status: DC

## 2017-10-26 MED ORDER — HALOPERIDOL 5 MG PO TABS: 10.0000 mg | ORAL_TABLET | Freq: Every day | ORAL | Status: DC

## 2017-10-26 MED ORDER — BENZTROPINE MESYLATE 1 MG PO TABS
1.0000 mg | ORAL_TABLET | Freq: Every day | ORAL | Status: DC
  Administered 2017-10-26: 1 mg via ORAL
  Filled 2017-10-26: qty 1
  Filled 2017-10-26: qty 7

## 2017-10-26 NOTE — ED Triage Notes (Signed)
Pt arriving with [employer] voluntarily due to auditory hallucinations and fear of harming himself. Pt states he is hearing voices that are telling him to cut his wrist.

## 2017-10-26 NOTE — ED Notes (Signed)
Pt has two pt belongings bags placed under the ice machine in triage.

## 2017-10-26 NOTE — BH Assessment (Addendum)
Tele Assessment Note   Patient Name: Edwin Henry MRN: [id number] Referring Physician: Burgess Amor, PA-C Location of Patient: [facility] ED, [facility] Location of Provider: Behavioral Health TTS Department  [name] is an 57 y.o. separated male who presents voluntarily to [facility] ED after calling law enforcement and asking for transportation to the ED. Pt was psychiatrically cleared at [facility] ED and discharged a few hours earlier. He states he is hearing voices telling him to kill himself by cutting his wrist. He also report seeing demons. He says he punched a wall after leaving [facility] and injured his elbow. Pt continues to verbalize suicidal ideation. He denies any history of suicide attempts. He denies current homicidal ideation or history of violence. He reports he has used alcohol and substances in the past but denies any recent use.   Pt is dressed in hospital scrubs, alert and oriented x4. Pt speaks in a clear tone, at moderate volume and normal pace. Motor behavior appears normal. Eye contact is good. Pt's mood is depressed and affect is congruent with mood. Thought process is coherent and relevant. There is no obvious indication Pt is currently responding to internal stimuli or experiencing delusional thought content. Pt was pleasant and cooperative throughout assessment. He is requesting inpatient psychiatric treatment.  NOTE FROM Assunta Found, NP FROM TODAY AT 1020:  Subjective:   Edwin Henry is a 57 y.o. male patient presented to [facility] with complaints of auditory hallucinations and unable to get his medications refilled after his discharged from psychiatric hospitalization on 10/19/17.   HPI:  Edwin Henry, 57 y.o., male patient seen via telepsych by this provider; chart reviewed and consulted with Dr. Lucianne Muss on 10/26/17.  On evaluation [name] reports he was unable to get his prescriptions filled after he was discharged from the hospital.  States that the voices have  come back and are starting to tell him to hurt himself.  Patient states that he did make it to his outpatient appointment at [facility] assessment done and there are suppose to let him know when his next appointment is.  Patient states he has medicaid and knows that he can get his medication cheep but he has no money until the first of next month and he won't be able to buy his medication.  Patient also states that he is  Homeless; but is interested in staying at half way house.  Patient states that he has no family or friends who could help him pay for his medication.  Patient also asked about illicit drug use; reports he went to a party and done the cocaine and marijuana once and that it is nothing that he does on a daily basis.  Informed that drugs make the hallucinations worse.  Patient denies suicidal/self-harm/homicidal ideation and paranoia.  Reports main concern is that the voices are back and want to get back on his medications before they get any worse. During evaluation Edwin Henry is alert/oriented x 4; calm/cooperative; and mood congruent with affect.  He did not appear to be responding to internal/external stimuli or delusional thoughts; but endorse auditory hallucinations. He denied suicidal/self-harm/homicidal ideation, and paranoia.  Patient answered question appropriately.  Discussed taking the haldol Decanoate injection in which he would only have to take monthly but would have to stay on PO for at least 7 days until medication was in system well enough.  Patient stated he felt that would be best for him to have the monthly injection.  Understanding voiced.  Spoke  with pharmacy to see if patient would be able to get 7 day supply of Haldol 10 mg and Cogentin 1 mg.            Past Psychiatric History: Previous diagnosis of Schizophrenia.  5 psychiatric hospitalization starting at age 11; last 3 days ago; Referred to [facility] for follow up after last psychiatric  hospitalization  Diagnosis: Schizophrenia, unspecified (HCC) [F20.9]  Past Medical History: History reviewed. No pertinent past medical history.  Past Surgical History:  Procedure Laterality Date  . KNEE SURGERY      Family History: No family history on file.  Social History:  reports that he has been smoking. He has been smoking about 0.50 packs per day. He has never used smokeless tobacco. He reports that he drinks alcohol. He reports that he has current or past drug history. Drug: Marijuana.  Additional Social History:  Alcohol / Drug Use Pain Medications: See MAR Prescriptions: See MAR Over the Counter: See MAR History of alcohol / drug use?: Yes Longest period of sobriety (when/how long): unknown Substance #1 Name of Substance 1: alcohol 1 - Age of First Use: 15 1 - Amount (size/oz): 1-2 40 oz beers 1 - Frequency: pt states "not often" 1 - Duration: 42 years 1 - Last Use / Amount: "Labor Day Weekend" Substance #2 Name of Substance 2: cocaine 2 - Age of First Use: unknown 2 - Amount (size/oz): unknown 2 - Frequency: pt states "not often" 2 - Duration: unknown 2 - Last Use / Amount: "Labor Day Weekend" Substance #3 Name of Substance 3: Marijuana 3 - Age of First Use: unknown 3 - Amount (size/oz): unknown 3 - Frequency: "not often" 3 - Duration: unknown 3 - Last Use / Amount: "Labor Day Weekend"  CIWA: CIWA-Ar BP: (!) 142/86 Pulse Rate: 89 COWS:    Allergies: No Known Allergies  Home Medications:  (Not in a hospital admission)  OB/GYN Status:  No LMP for male patient.  General Assessment Data [facility] of Assessment: [facility] TTS Assessment: In system Is this a Tele or Face-to-Face Assessment?: Tele Assessment Is this an Initial Assessment or a Re-assessment for this encounter?: Initial Assessment Patient Accompanied by:: N/A(Alone) Language Other than English: No Living Arrangements: Homeless/Shelter What gender do you identify as?: Male Marital status:  Separated Maiden name: NA Pregnancy Status: No Living Arrangements: Alone Can pt return to current living arrangement?: Yes Admission Status: Voluntary Is patient capable of signing voluntary admission?: Yes Referral Source: Self/Family/Friend Insurance type: Medicaid     Crisis Care Plan Living Arrangements: Alone Legal Guardian: Other:(Self) Name of Psychiatrist: Denies Name of Therapist: Denies  Education Status Is patient currently in school?: No Highest grade of school patient has completed: N/A Is the patient employed, unemployed or receiving disability?: Receiving disability income  Risk to self with the past 6 months Suicidal Ideation: Yes-Currently Present Has patient been a risk to self within the past 6 months prior to admission? : Yes Suicidal Intent: No Has patient had any suicidal intent within the past 6 months prior to admission? : No Is patient at risk for suicide?: Yes Suicidal Plan?: Yes-Currently Present Has patient had any suicidal plan within the past 6 months prior to admission? : Yes Specify Current Suicidal Plan: Cut his wrist Access to Means: Yes Specify Access to Suicidal Means: Access to sharps What has been your use of drugs/alcohol within the last 12 months?: Alcohol Previous Attempts/Gestures: No How many times?: 0 Other Self Harm Risks: Pt reports he  punched a wall today Triggers for Past Attempts: None known Intentional Self Injurious Behavior: Damaging Comment - Self Injurious Behavior: Pt reports he punched a wall today and injured his elbow Family Suicide History: No Recent stressful life event(s): Financial Problems, Other (Comment)(Hallucinations) Persecutory voices/beliefs?: Yes Depression: Yes Depression Symptoms: Despondent, Loss of interest in usual pleasures, Feeling worthless/self pity Substance abuse history and/or treatment for substance abuse?: Yes Suicide prevention information given to non-admitted patients: Yes  Risk  to Others within the past 6 months Homicidal Ideation: No Does patient have any lifetime risk of violence toward others beyond the six months prior to admission? : No Thoughts of Harm to Others: No Current Homicidal Intent: No Current Homicidal Plan: No Access to Homicidal Means: No Identified Victim: None History of harm to others?: No Assessment of Violence: None Noted Violent Behavior Description: Pt denies history of violence Does patient have access to weapons?: No Criminal Charges Pending?: No Does patient have a court date: No Is patient on probation?: No  Psychosis Hallucinations: Auditory, Visual(Hearing voices, seeing demons) Delusions: None noted  Mental Status Report Appearance/Hygiene: In scrubs Eye Contact: Good Motor Activity: Unremarkable Speech: Logical/coherent Level of Consciousness: Alert Mood: Depressed Affect: Depressed Anxiety Level: None Thought Processes: Coherent, Relevant Judgement: Impaired Orientation: Person, Place, Time, Situation Obsessive Compulsive Thoughts/Behaviors: None  Cognitive Functioning Concentration: Normal Memory: Recent Intact, Remote Intact Is patient IDD: No Insight: Poor Impulse Control: Poor Appetite: Fair Have you had any weight changes? : No Change Amount of the weight change? (lbs): 0 lbs Sleep: Decreased Total Hours of Sleep: 4 Vegetative Symptoms: None  ADLScreening [facility] Assessment Services) Patient's cognitive ability adequate to safely complete daily activities?: Yes Patient able to express need for assistance with ADLs?: Yes Independently performs ADLs?: Yes (appropriate for developmental age)  Prior Inpatient Therapy Prior Inpatient Therapy: Yes Prior Therapy Dates: Discharged from hospital 10/22/2017. Prior Therapy Facilty/Provider(s[place] [facility] Reason for Treatment: depression/psychosis  Prior Outpatient Therapy Prior Outpatient Therapy: Yes Prior Therapy Dates:  current [facility] Reason for Treatment: psychosis Does patient have an ACCT team?: No Does patient have Intensive In-House Services?  : No Does patient have [facility] services? : No Does patient have P4CC services?: No  ADL Screening (condition at time of admission) Patient's cognitive ability adequate to safely complete daily activities?: Yes Is the patient deaf or have difficulty hearing?: No Does the patient have difficulty seeing, even when wearing glasses/contacts?: No Does the patient have difficulty concentrating, remembering, or making decisions?: Yes Patient able to express need for assistance with ADLs?: Yes Does the patient have difficulty dressing or bathing?: No Independently performs ADLs?: Yes (appropriate for developmental age) Does the patient have difficulty walking or climbing stairs?: No Weakness of Legs: None Weakness of Arms/Hands: None  Home Assistive Devices/Equipment Home Assistive Devices/Equipment: None    Abuse/Neglect Assessment (Assessment to be complete while patient is alone) Abuse/Neglect Assessment Can Be Completed: Yes Physical Abuse: Denies Verbal Abuse: Denies Sexual Abuse: Denies Exploitation of patient/patient's resources: Denies Self-Neglect: Denies     Merchant navy officerAdvance Directives (For Healthcare) Does Patient Have a Medical Advance Directive?: No Would patient like information on creating a medical advance directive?: No - Patient declined          Disposition: Gave clinical report to Donell SievertSpencer Simon, PA who recommended Pt be observed overnight for safety and stabilization and evaluated by psychiatry in the morning. Notified Burgess AmorJulie Idol, PA-C and Hansel StarlingLatricia London, RN of recommendation.  Disposition Initial Assessment  Completed for this Encounter: Yes Patient referred to: Other (Comment)  This service was provided via telemedicine using a 2-way, interactive audio and video technology.  Names of all  persons participating in this telemedicine service and their role in this encounter. Name: [name] Role: Patient  Name: Shela Commons, [place] Role: TTS counselor         Harlin Rain Patsy [place], [facility], [facility], [facility] Triage Specialist [phone]  Pamalee Leyden 10/26/2017 11:16 PM

## 2017-10-26 NOTE — ED Notes (Signed)
[id number] Expected date:  Expected time:  Means of arrival:  Comments: [place]

## 2017-10-26 NOTE — ED Notes (Signed)
Bed: [facility] Expected date:  Expected time:  Means of arrival:  Comments: 

## 2017-10-26 NOTE — ED Notes (Signed)
Pt presents with auditory hallucinations, hearing voices telling him to cut his wrist. SI, denies HI.  A&O x 3, no distress noted, calm & cooperative, complaint of Left arm pain, pt reports he hit a wall with Left arm.  X-Ray negative.  Rates pain as 8 on pain scale.  Pain meds given.  Monitoring for safety, Q 15 min checks in effect.

## 2017-10-26 NOTE — ED Notes (Signed)
Patient ambulatory to bathroom with steady gait at this time 

## 2017-10-26 NOTE — ED Notes (Signed)
RN explained to patient that he needs to stay for discharge instructions. Pt given 2 sandwiches, bus pass, and week's worth of medications provided by psychiatry. Patient stated that he could not wait any longer. MD Effie ShyWentz made aware.

## 2017-10-26 NOTE — ED Provider Notes (Signed)
[facility]-EMERGENCY DEPT Provider Note   CSN: [id number] Arrival date & time: 10/26/17  1925     History   Chief Complaint Chief Complaint  Patient presents with  . Hallucinations    HPI Edwin Henry is a 57 y.o. male who has a history of schizophrenia with acute exacerbation including visual and auditory hallucinations encouraging him to slit his wrists.  He was seen here for this same complaint just yesterday and today, and left mid afternoon today with a weeks worth of his psych medicines since he was out of his medicines, he was evaluated by TTS who felt he was safe for discharge home and that he simply needed to get back on his medicines.  He however feels unsafe and is afraid he will slit his wrists.  He struck his left elbow against a wall today, now with pain and swelling at the site as an attempt to make the voices go away.    The history is provided by the patient.    History reviewed. No pertinent past medical history.  Patient Active Problem List   Diagnosis Date Noted  . Auditory hallucinations 10/26/2017  . Schizophrenia, unspecified (HCC) 10/10/2017    Past Surgical History:  Procedure Laterality Date  . KNEE SURGERY          Home Medications    Prior to Admission medications   Medication Sig Start Date End Date Taking? Authorizing Provider  benztropine (COGENTIN) 1 MG tablet Take 1 tablet (1 mg total) by mouth 2 (two) times daily. For prevention of drug induced tremors 10/19/17  Yes Nwoko, Nicole Kindred I, NP  haloperidol (HALDOL) 5 MG tablet Take 1 tablet (5 mg) by mouth in the morning & 2 tablet (10 mg) at bedtime: For mood control 10/19/17  Yes Armandina Stammer I, NP  sertraline (ZOLOFT) 50 MG tablet Take 1 tablet (50 mg total) by mouth daily. For depression 10/20/17  Yes Armandina Stammer I, NP  hydrOXYzine (ATARAX/VISTARIL) 50 MG tablet Take 1 tablet (50 mg total) by mouth every 6 (six) hours as needed for anxiety. Patient not taking: Reported on  10/26/2017 10/19/17   Armandina Stammer I, NP  nicotine (NICODERM CQ - DOSED IN MG/24 HOURS) 21 mg/24hr patch Place 1 patch (21 mg total) onto the skin daily. (May buy from over the counter): For smoking cessation Patient not taking: Reported on 10/26/2017 10/20/17   Armandina Stammer I, NP  traZODone (DESYREL) 100 MG tablet Take 1 tablet (100 mg total) by mouth at bedtime. For sleep Patient not taking: Reported on 10/26/2017 10/19/17   Armandina Stammer I, NP    Family History No family history on file.  Social History Social History   Tobacco Use  . Smoking status: Current Every Day Smoker    Packs/day: 0.50  . Smokeless tobacco: Never Used  Substance Use Topics  . Alcohol use: Yes    Comment: 1-2 beers every 2 days  . Drug use: Yes    Types: Marijuana    Comment: occasional THC use     Allergies   Patient has no known allergies.   Review of Systems Review of Systems  Constitutional: Negative for fever.  HENT: Negative for congestion and sore throat.   Eyes: Negative.   Respiratory: Negative for chest tightness and shortness of breath.   Cardiovascular: Negative for chest pain.  Gastrointestinal: Negative for abdominal pain, nausea and vomiting.  Genitourinary: Negative.   Musculoskeletal: Positive for arthralgias and joint swelling. Negative for neck pain.  Skin: Negative.  Negative for rash and wound.  Neurological: Negative for dizziness, weakness, light-headedness, numbness and headaches.  Psychiatric/Behavioral: Positive for agitation, hallucinations, self-injury and suicidal ideas.     Physical Exam Updated Vital Signs BP (!) 142/86 (BP Location: Right Arm)   Pulse 89   Temp 98.4 F (36.9 C) (Oral)   Resp 16   Ht 6\' 2"  (1.88 m)   Wt 81.2 kg   SpO2 97%   BMI 22.98 kg/m   Physical Exam  Constitutional: He is oriented to person, place, and time. He appears well-developed and well-nourished.  HENT:  Head: Normocephalic and atraumatic.  Eyes: Conjunctivae are normal.    Neck: Normal range of motion.  Cardiovascular: Normal rate, regular rhythm, normal heart sounds and intact distal pulses.  Pulmonary/Chest: Effort normal and breath sounds normal. He has no wheezes.  Abdominal: He exhibits no distension. There is no tenderness.  Musculoskeletal: Normal range of motion. He exhibits edema and tenderness. He exhibits no deformity.       Left elbow: He exhibits swelling. Tenderness found. Lateral epicondyle tenderness noted.  Neurological: He is alert and oriented to person, place, and time.  Skin: Skin is warm and dry.  Psychiatric: His behavior is normal. His mood appears anxious. His speech is not rapid and/or pressured. Thought content is delusional. He expresses suicidal ideation.  Nursing note and vitals reviewed.    ED Treatments / Results  Labs (all labs ordered are listed, but only abnormal results are displayed) Labs Reviewed  BASIC METABOLIC PANEL - Abnormal; Notable for the following components:      Result Value   Glucose, Bld 109 (*)    All other components within normal limits  ETHANOL - Abnormal; Notable for the following components:   Alcohol, Ethyl (B) 116 (*)    All other components within normal limits  ACETAMINOPHEN LEVEL - Abnormal; Notable for the following components:   Acetaminophen (Tylenol), Serum <10 (*)    All other components within normal limits  CBC WITH DIFFERENTIAL/PLATELET  SALICYLATE LEVEL  RAPID URINE DRUG SCREEN, HOSP PERFORMED  URINALYSIS, ROUTINE W REFLEX MICROSCOPIC    EKG None  Radiology Dg Elbow Complete Left  Result Date: 10/26/2017 CLINICAL DATA:  Elbow injury with bruising and swelling. EXAM: LEFT ELBOW - COMPLETE 3+ VIEW COMPARISON:  None. FINDINGS: There is no evidence of fracture, dislocation, or joint effusion. There is no evidence of arthropathy or other focal bone abnormality. Soft tissues are unremarkable. IMPRESSION: Negative. Electronically Signed   By: Kennith Center M.D.   On: 10/26/2017  20:49    Procedures Procedures (including critical care time)  Medications Ordered in ED Medications  nicotine (NICODERM CQ - dosed in mg/24 hours) patch 21 mg (21 mg Transdermal Refused 10/26/17 2147)  haloperidol (HALDOL) tablet 10 mg (10 mg Oral Given 10/26/17 2138)  benztropine (COGENTIN) tablet 1 mg (1 mg Oral Given 10/26/17 2139)  ibuprofen (ADVIL,MOTRIN) tablet 800 mg (800 mg Oral Given 10/26/17 2138)     Initial Impression / Assessment and Plan / ED Course  I have reviewed the triage vital signs and the nursing notes.  Pertinent labs & imaging results that were available during my care of the patient were reviewed by me and considered in my medical decision making (see chart for details).     Pt with ongoing hallucinations both auditory and visual with thoughts of self harm. Pt does not feel safe or stable to be on his own at this time, despite recent [facility] evaluation  ytd and today. Will have TTS re-eval pt.  Medically cleared.  Final Clinical Impressions(s) / ED Diagnoses   Final diagnoses:  None    ED Discharge Orders    None       Victoriano Laindol, Amijah Timothy, PA-C 10/26/17 2310    Rolan BuccoBelfi, Melanie, MD 10/27/17 0025

## 2017-10-26 NOTE — ED Provider Notes (Signed)
Patient presented last evening for evaluation of hallucinations and thoughts of self-harm.  He was apparently not taking his usual medications.  At this time he has been seen by TTS, who feels like he can be discharged.  I have arranged for him to receive an IM injection of Haldol Decanoate, and began on 1 week supply of hospital-provided Haldol and Cogentin tablets.  At this time the patient has received his medications, and left prior to receiving discharge instructions in a printed format.  He was given verbal instructions by the nurse.   Mancel BaleWentz, Abigial Newville, MD 10/26/17 1353

## 2017-10-26 NOTE — ED Notes (Signed)
Breakfast tray ordered 

## 2017-10-26 NOTE — ED Notes (Signed)
Regular Diet was ordered for Lunch. 

## 2017-10-26 NOTE — Consult Note (Signed)
Telepsych Consultation   Reason for Consult:  Auditory hallucinations Referring Physician:  Quintella Reichert, MD Location of Patient: [facility] Location of Provider: [facility]  Patient Identification: Edwin Henry MRN:  [id number] Principal Diagnosis: <principal problem not specified> Diagnosis:   Patient Active Problem List   Diagnosis Date Noted  . Schizophrenia, unspecified ([place]) [F20.9] 10/10/2017    Total Time spent with patient: 45 minutes  Subjective:   Edwin Henry is a 57 y.o. male patient presented to [facility] with complaints of auditory hallucinations and unable to get his medications refilled after his discharged from psychiatric hospitalization on 10/19/17.  HPI:  Edwin Henry, 57 y.o., male patient seen via telepsych by this provider; chart reviewed and consulted with Dr. Dwyane Dee on 10/26/17.  On evaluation Edwin Henry reports he was unable to get his prescriptions filled after he was discharged from the hospital.  States that the voices have come back and are starting to tell him to hurt himself.  Patient states that he did make it to his outpatient appointment at [facility] assessment done and there are suppose to let him know when his next appointment is.  Patient states he has medicaid and knows that he can get his medication cheep but he has no money until the first of next month and he won't be able to buy his medication.  Patient also states that he is  Homeless; but is interested in staying at half way house.  Patient states that he has no family or friends who could help him pay for his medication.  Patient also asked about illicit drug use; reports he went to a party and done the cocaine and marijuana once and that it is nothing that he does on a daily basis.  Informed that drugs make the hallucinations worse.  Patient denies suicidal/self-harm/homicidal ideation and paranoia.  Reports main concern is that the voices are back and want to get back on his  medications before they get any worse. During evaluation Edwin Henry is alert/oriented x 4; calm/cooperative; and mood congruent with affect.  He did not appear to be responding to internal/external stimuli or delusional thoughts; but endorse auditory hallucinations. He denied suicidal/self-harm/homicidal ideation, and paranoia.  Patient answered question appropriately.  Discussed taking the haldol Decanoate injection in which he would only have to take monthly but would have to stay on PO for at least 7 days until medication was in system well enough.  Patient stated he felt that would be best for him to have the monthly injection.  Understanding voiced.  Spoke with pharmacy to see if patient would be able to get 7 day supply of Haldol 10 mg and Cogentin 1 mg.           Past Psychiatric History: Previous diagnosis of Schizophrenia.  5 psychiatric hospitalization starting at age 37; last 3 days ago; Referred to [facility] for [facility] up after last psychiatric hospitalization   Risk to Self: Suicidal Ideation: Yes-Currently Present Suicidal Intent: No Is patient at risk for suicide?: Yes Suicidal Plan?: Yes-Currently Present Specify Current Suicidal Plan: cut wrist Access to Means: No What has been your use of drugs/alcohol within the last 12 months?: alcohol How many times?: 0 Other Self Harm Risks: denies Triggers for Past Attempts: None known Intentional Self Injurious Behavior: None Risk to Others: Homicidal Ideation: No Thoughts of Harm to Others: No Current Homicidal Intent: No Current Homicidal Plan: No Access to Homicidal Means: No Identified Victim: NA History of harm to others?: No  Assessment of Violence: None Noted Violent Behavior Description: denies Does patient have access to weapons?: No Criminal Charges Pending?: No Does patient have a court date: No Prior Inpatient Therapy: Prior Inpatient Therapy: Yes Prior Therapy Dates: Discharged from hospital 10/22/2017. Prior  Therapy Facilty/Provider(s): facilities in [place], and [facility] Reason for Treatment: depression/psychosis Prior Outpatient Therapy: Prior Outpatient Therapy: Yes Prior Therapy Dates: current Prior Therapy Facilty/Provider(s): [facility] Reason for Treatment: psychosis Does patient have an ACCT team?: No Does patient have Intensive In-House Services?  : No Does patient have [facility] services? : No Does patient have P4CC services?: No  Past Medical History: History reviewed. No pertinent past medical history.  Past Surgical History:  Procedure Laterality Date  . KNEE SURGERY     Family History: History reviewed. No pertinent family history. Family Psychiatric  History: Unaware Social History:  Social History   Substance and Sexual Activity  Alcohol Use Yes   Comment: 1-2 beers every 2 days     Social History   Substance and Sexual Activity  Drug Use Yes  . Types: Marijuana   Comment: occasional THC use    Social History   Socioeconomic History  . Marital status: Single    Spouse name: Not on file  . Number of children: Not on file  . Years of education: Not on file  . Highest education level: Not on file  Occupational History  . Not on file  Social Needs  . Financial resource strain: Not on file  . Food insecurity:    Worry: Not on file    Inability: Not on file  . Transportation needs:    Medical: Not on file    Non-medical: Not on file  Tobacco Use  . Smoking status: Current Every Day Smoker    Packs/day: 0.50  . Smokeless tobacco: Never Used  Substance and Sexual Activity  . Alcohol use: Yes    Comment: 1-2 beers every 2 days  . Drug use: Yes    Types: Marijuana    Comment: occasional THC use  . Sexual activity: Not on file  Lifestyle  . Physical activity:    Days per week: Not on file    Minutes per session: Not on file  . Stress: Not on file  Relationships  . Social connections:    Talks on phone: Not on file    Gets together: Not on file     Attends religious service: Not on file    Active member of club or organization: Not on file    Attends meetings of clubs or organizations: Not on file    Relationship status: Not on file  Other Topics Concern  . Not on file  Social History Narrative  . Not on file   Additional Social History:    Allergies:  No Known Allergies  Labs:  Results for orders placed or performed during the hospital encounter of 10/24/17 (from the past 48 hour(s))  Rapid urine drug screen (hospital performed)     Status: Abnormal   Collection Time: 10/24/17  6:35 PM  Result Value Ref Range   Opiates NONE DETECTED NONE DETECTED   Cocaine POSITIVE (A) NONE DETECTED   Benzodiazepines NONE DETECTED NONE DETECTED   Amphetamines NONE DETECTED NONE DETECTED   Tetrahydrocannabinol POSITIVE (A) NONE DETECTED   Barbiturates NONE DETECTED NONE DETECTED    Comment: (NOTE) DRUG SCREEN FOR MEDICAL PURPOSES ONLY.  IF CONFIRMATION IS NEEDED FOR ANY PURPOSE, NOTIFY LAB WITHIN 5 DAYS. LOWEST DETECTABLE LIMITS  FOR URINE DRUG SCREEN Drug Class                     Cutoff (ng/mL) Amphetamine and metabolites    1000 Barbiturate and metabolites    200 Benzodiazepine                 789 Tricyclics and metabolites     300 Opiates and metabolites        300 Cocaine and metabolites        300 THC                            50 Performed at [facility], [place] [address] [place], [place] [address]   Comprehensive metabolic panel     Status: None   Collection Time: 10/24/17  6:39 PM  Result Value Ref Range   Sodium 138 135 - 145 mmol/L   Potassium 4.0 3.5 - 5.1 mmol/L   Chloride 101 98 - 111 mmol/L   CO2 23 22 - 32 mmol/L   Glucose, Bld 93 70 - 99 mg/dL   BUN 11 6 - 20 mg/dL   Creatinine, Ser 0.79 0.61 - 1.24 mg/dL   Calcium 9.4 8.9 - 10.3 mg/dL   Total Protein 8.1 6.5 - 8.1 g/dL   Albumin 4.6 3.5 - 5.0 g/dL   AST 33 15 - 41 U/L   ALT 41 0 - 44 U/L   Alkaline Phosphatase 83 38 - 126 U/L   Total  Bilirubin 0.7 0.3 - 1.2 mg/dL   GFR calc non Af Amer >60 >60 mL/min   GFR calc Af Amer >60 >60 mL/min    Comment: (NOTE) The eGFR has been calculated using the CKD EPI equation. This calculation has not been validated in all clinical situations. eGFR's persistently <60 mL/min signify possible Chronic Kidney Disease.    Anion gap 14 5 - 15    Comment: Performed at [facility] [address], [place], [place] [address]  Ethanol     Status: Abnormal   Collection Time: 10/24/17  6:39 PM  Result Value Ref Range   Alcohol, Ethyl (B) 195 (H) <10 mg/dL    Comment: (NOTE) Lowest detectable limit for serum alcohol is 10 mg/dL. For medical purposes only. Performed at [facility], [place] [address] [place], [place] [address]   Salicylate level     Status: None   Collection Time: 10/24/17  6:39 PM  Result Value Ref Range   Salicylate Lvl <7.8 2.8 - 30.0 mg/dL    Comment: Performed at [facility] [address] [place], [place] [address]  Acetaminophen level     Status: Abnormal   Collection Time: 10/24/17  6:39 PM  Result Value Ref Range   Acetaminophen (Tylenol), Serum <10 (L) 10 - 30 ug/mL    Comment: (NOTE) Therapeutic concentrations vary significantly. A range of 10-30 ug/mL  may be an effective concentration for many patients. However, some  are best treated at concentrations outside of this range. Acetaminophen concentrations >150 ug/mL at 4 hours after ingestion  and >50 ug/mL at 12 hours after ingestion are often associated with  toxic reactions. Performed at [facility], [place] [address], [place], [place] [address]   cbc     Status: None   Collection Time: 10/24/17  6:39 PM  Result Value Ref Range   WBC 8.9 4.0 - 10.5 K/uL  RBC 5.19 4.22 - 5.81 MIL/uL   Hemoglobin 16.1 13.0 - 17.0 g/dL   HCT 48.0 39.0 - 52.0 %   MCV 92.5 78.0 - 100.0 fL   MCH 31.0 26.0 - 34.0 pg   MCHC 33.5 30.0 - 36.0 g/dL   RDW 12.4 11.5 - 15.5 %   Platelets 322 150 - 400  K/uL    Comment: Performed at [facility], [place] [address], [place],  [address]    Medications:  Current Facility-Administered Medications  Medication Dose Route Frequency Provider Last Rate Last Dose  . alum & mag hydroxide-simeth (MAALOX/MYLANTA) 200-200-20 MG/5ML suspension 30 mL  30 mL Oral Q6H PRN Quintella Reichert, MD      . ibuprofen (ADVIL,MOTRIN) tablet 600 mg  600 mg Oral Q8H PRN Quintella Reichert, MD   600 mg at 10/26/17 0415  . ondansetron (ZOFRAN) tablet 4 mg  4 mg Oral Q8H PRN Quintella Reichert, MD       Current Outpatient Medications  Medication Sig Dispense Refill  . benztropine (COGENTIN) 1 MG tablet Take 1 tablet (1 mg total) by mouth 2 (two) times daily. For prevention of drug induced tremors 60 tablet 0  . haloperidol (HALDOL) 5 MG tablet Take 1 tablet (5 mg) by mouth in the morning & 2 tablet (10 mg) at bedtime: For mood control 90 tablet 0  . hydrOXYzine (ATARAX/VISTARIL) 50 MG tablet Take 1 tablet (50 mg total) by mouth every 6 (six) hours as needed for anxiety. 60 tablet 0  . nicotine (NICODERM CQ - DOSED IN MG/24 HOURS) 21 mg/24hr patch Place 1 patch (21 mg total) onto the skin daily. (May buy from over the counter): For smoking cessation 28 patch 0  . sertraline (ZOLOFT) 50 MG tablet Take 1 tablet (50 mg total) by mouth daily. For depression 30 tablet 0  . traZODone (DESYREL) 100 MG tablet Take 1 tablet (100 mg total) by mouth at bedtime. For sleep 30 tablet 0    Musculoskeletal: Strength & Muscle Tone: within normal limits Gait & Station: normal Patient leans: N/A  Psychiatric Specialty Exam: Physical Exam  ROS  Blood pressure (!) 144/84, pulse 72, temperature 98.2 F (36.8 C), temperature source Oral, resp. rate 18, SpO2 99 %.There is no height or weight on file to calculate BMI.  General Appearance: Casual  Eye Contact:  Good  Speech:  Clear and Coherent and Normal Rate  Volume:  Normal  Mood:  Anxious  Affect:  Appropriate and Congruent   Thought Process:  Coherent and Goal Directed  Orientation:  Henry (Time, Place, and Person)  Thought Content:  Hallucinations: Auditory  Suicidal Thoughts:  No  Homicidal Thoughts:  No  Memory:  Immediate;   Good Recent;   Good Remote;   Good  Judgement:  Intact  Insight:  Present  Psychomotor Activity:  Normal  Concentration:  Concentration: Good and Attention Span: Good  Recall:  Good  Fund of Knowledge:  Good  Language:  Good  Akathisia:  No  Handed:  Right  AIMS (if indicated):     Assets:  Communication Skills Desire for Improvement Resilience  ADL's:  Intact  Cognition:  WNL  Sleep:       Treatment Plan Summary: Plan Give first dose of Haloperidol decanate 50 mg; and 7 day supply of Cogentin 1 mg daily at bed time and haloperidol 10 mg daily at bedtime  Disposition:  Patient psychiatrically cleared.  Follow up with [facility] for outpatient [facility] services  No  evidence of imminent risk to self or others at present.   Patient does not meet criteria for psychiatric inpatient admission. Supportive therapy provided about ongoing stressors. Discussed crisis plan, support from social network, calling 911, coming to the Emergency Department, and calling Suicide Hotline.   Dr. Regenia Skeeter unable to take report at time of call.  Spoke with Sharyn Lull, RN; informed of above recommendation and disposition.   States she will inform Dr. Regenia Skeeter.    Sharyn Lull, RN also informed that the sample or 7 day medication would be sent over by security..    This service was provided via telemedicine using a 2-way, interactive audio and video technology.  Names of all persons participating in this telemedicine service and their role in this encounter. Name: Earleen Newport, NP Role: Tele psych Assessment  Name: Dr. Dwyane Dee Role: Psychiatrist  Name: Scarlette Shorts Role: Patient  Name:  Role:     Earleen Newport, NP 10/26/2017 10:20 AM

## 2017-10-26 NOTE — ED Notes (Signed)
Patient was given a Cup of Ginger Ale w/ Ice and Cookies.

## 2017-10-26 NOTE — ED Notes (Signed)
TTS bedside 

## 2017-10-27 MED ORDER — IBUPROFEN 800 MG PO TABS
800.0000 mg | ORAL_TABLET | Freq: Once | ORAL | Status: AC
  Administered 2017-10-27: 800 mg via ORAL
  Filled 2017-10-27: qty 1

## 2017-10-27 NOTE — Consult Note (Addendum)
[facility]  10/27/2017 11:44 AM Edwin HackerBilly Henry  MRN:  [id number] Principal Problem: Auditory hallucinations Discharge Diagnoses:  Patient Active Problem List   Diagnosis Date Noted  . Auditory hallucinations [R44.0] 10/26/2017  . Schizophrenia, unspecified (HCC) [F20.9] 10/10/2017    Subjective: Pt was seen and chart reviewed with treatment team and Dr Jannifer FranklinAkintayo.  Pt denies suicidal/homicidal ideation, denies auditory/visual hallucinations and does not appear to be responding to internal stimuli. Pt's UDS positive for cocaine and THC, BAL 116. Pt's lab work is unremarkable for any other abnormality. [facility] after a 9 day stay. He was to follow up at [facility] on 10[facility]2017 for therapy and medication management. Pt stated he kept the appointment with them but it is highly unlikely since he has been in the emergency room several times this week alone. [facility] will be [facility] to [facility] after[facility]. He will be referred to Peer [facility] for assistance in the community with substance abuse resources in the community. Pt is psychiatrically clear and stable for discharge.   Total Time spent with patient: 45 minutes  Past Psychiatric History: As above  Past Medical History: History reviewed. No pertinent past medical history. Past Surgical History:  Procedure Laterality Date  . KNEE SURGERY     Family History: No family history on file. Family Psychiatric  History: Unknown Social History:  Social History   Substance and Sexual Activity  Alcohol Use Yes   Comment: 1-2 beers every 2 days    Social History   Substance and Sexual Activity  Drug Use Yes  . Types: Marijuana   Comment: occasional THC use   Social History   Socioeconomic History  . Marital status: Single    Spouse name: Not on file  . Number of children: Not on file  . Years of education: Not on file  . Highest education level: Not on file  Occupational History   . Not on file  Social Needs  . Financial resource strain: Not on file  . Food insecurity:    Worry: Not on file    Inability: Not on file  . Transportation needs:    Medical: Not on file    Non-medical: Not on file  Tobacco Use  . Smoking status: Current Every Day Smoker    Packs/day: 0.50  . Smokeless tobacco: Never Used  Substance and Sexual Activity  . Alcohol use: Yes    Comment: 1-2 beers every 2 days  . Drug use: Yes    Types: Marijuana    Comment: occasional THC use  . Sexual activity: Not on file  Lifestyle  . Physical activity:    Days per week: Not on file    Minutes per session: Not on file  . Stress: Not on file  Relationships  . Social connections:    Talks on phone: Not on file    Gets together: Not on file    Attends religious service: Not on file    Active member of club or organization: Not on file    Attends meetings of clubs or organizations: Not on file    Relationship status: Not on file  Other Topics Concern  . Not on file  Social History Narrative  . Not on file    Has this patient used any form of tobacco in the last 30 days? (Cigarettes, Smokeless Tobacco, Cigars, and/or Pipes) Prescription not provided because: pt declined  Current Medications: Current Facility-Administered Medications  Medication Dose Route Frequency Provider Last Rate Last Dose  . benztropine (COGENTIN) tablet 1 mg  1 mg Oral BID Burgess Amor, PA-C   1 mg at 10/27/17 1043  . haloperidol (HALDOL) tablet 5 mg  5 mg Oral BID Burgess Amor, PA-C   5 mg at 10/27/17 1041  . nicotine (NICODERM CQ - dosed in mg/24 hours) patch 21 mg  21 mg Transdermal Once Idol, Julie, PA-C      . sertraline (ZOLOFT) tablet 50 mg  50 mg Oral Daily Idol, Raynelle Fanning, PA-C   50 mg at 10/27/17 1041   Current Outpatient Medications  Medication Sig Dispense Refill  . benztropine (COGENTIN) 1 MG tablet Take 1 tablet (1 mg total) by mouth 2 (two) times daily. For prevention of drug induced tremors 60 tablet 0   . haloperidol (HALDOL) 5 MG tablet Take 1 tablet (5 mg) by mouth in the morning & 2 tablet (10 mg) at bedtime: For mood control 90 tablet 0  . sertraline (ZOLOFT) 50 MG tablet Take 1 tablet (50 mg total) by mouth daily. For depression 30 tablet 0  . hydrOXYzine (ATARAX/VISTARIL) 50 MG tablet Take 1 tablet (50 mg total) by mouth every 6 (six) hours as needed for anxiety. (Patient not taking: Reported on 10/26/2017) 60 tablet 0  . nicotine (NICODERM CQ - DOSED IN MG/24 HOURS) 21 mg/24hr patch Place 1 patch (21 mg total) onto the skin daily. (May buy from over the counter): For smoking cessation (Patient not taking: Reported on 10/26/2017) 28 patch 0  . traZODone (DESYREL) 100 MG tablet Take 1 tablet (100 mg total) by mouth at bedtime. For sleep (Patient not taking: Reported on 10/26/2017) 30 tablet 0    Musculoskeletal: Strength & Muscle Tone: within normal limits Gait & Station: normal Patient leans: N/A  Psychiatric Specialty Exam: Physical Exam  Constitutional: He is oriented to person, place, and time. He appears well-developed and well-nourished.  HENT:  Head: Normocephalic.  Respiratory: Effort normal.  Musculoskeletal: Normal range of motion.  Neurological: He is alert and oriented to person, place, and time.  Psychiatric: His speech is normal and behavior is normal. Thought content normal. His mood appears anxious. Cognition and memory are normal. He expresses impulsivity. He exhibits a depressed mood.    Review of Systems  Psychiatric/Behavioral: Positive for depression and substance abuse. Negative for hallucinations, memory loss and suicidal ideas. The patient is nervous/anxious. The patient does not have insomnia.   All other systems reviewed and are negative.   Blood pressure (!) 168/92, pulse 73, temperature 98.7 F (37.1 C), temperature source Oral, resp. rate 20, height 6\' 2"  (1.88 m), weight 81.2 kg, SpO2 99 %.Body mass index is 22.98 kg/m.  General Appearance: Casual  Eye  Contact:  Good  Speech:  Clear and Coherent and Normal Rate  Volume:  Normal  Mood:  Anxious and Depressed  Affect:  Congruent and Depressed  Thought Process:  Coherent, Goal Directed and Linear  Orientation:  Full (Time, Place, and Person)  Thought Content:  Logical  Suicidal Thoughts:  No  Homicidal Thoughts:  No  Memory:  Immediate;   Good Recent;   Fair Remote;   Fair  Judgement:  Fair  Insight:  Fair  Psychomotor Activity:  Normal  Concentration:  Concentration: Good and Attention Span: Good  Recall:  Good  Fund of Knowledge:  Good  Language:  Good  Akathisia:  No  Handed:  Right  AIMS (if indicated):     Assets:  Communication  Skills Health and safety inspector Housing  ADL's:  Intact  Cognition:  WNL  Sleep:   Fair     Demographic Factors:  Male, Low socioeconomic status and Unemployed  Loss Factors: Financial problems/change in socioeconomic status  Historical Factors: Impulsivity  Risk Reduction Factors:   Sense of responsibility to family  Continued Clinical Symptoms:  Depression:   Impulsivity Alcohol/Substance Abuse/Dependencies  Cognitive Features That Contribute To Risk:  Closed-mindedness    Suicide Risk:  Minimal: No identifiable suicidal ideation.  Patients presenting with no risk factors but with morbid ruminations; may be classified as minimal risk based on the severity of the depressive symptoms    Plan Of Care/Follow-up recommendations:  Activity:  as tolerated Diet:  Heart Healthy  Disposition: Take all medications as prescribed. Keep all follow-up appointments as scheduled.  Do not consume alcohol or use illegal drugs while on prescription medications. Report any adverse effects from your medications to your primary care provider promptly.  In the event of recurrent symptoms or worsening symptoms, call 911, a crisis hotline, or go to the nearest emergency department for evaluation.   Laveda Abbe, NP 10/27/2017, 11:44  AM  Patient seen face-to-face for psychiatric evaluation, chart reviewed and case discussed with the physician extender and developed treatment plan. Reviewed the information documented and agree with the treatment plan. Thedore Mins, MD

## 2017-10-27 NOTE — ED Notes (Signed)
Pt discharged home. Discharged instructions read to pt who verbalized understanding. All belongings returned to pt who signed for same. Denies SI/HI, is not delusional and not responding to internal stimuli. Escorted pt to the ED exit.    

## 2017-10-27 NOTE — Discharge Instructions (Signed)
For your mental health needs, please follow up at: ° °[facility] °[address] °[place], [place] [address] °[phone] ° °

## 2017-11-05 ENCOUNTER — Emergency Department (HOSPITAL_COMMUNITY)
Admission: EM | Admit: 2017-11-05 | Discharge: 2017-11-06 | Disposition: A | Discharge status: Home / Self Care | Payer: Medicaid Other | Attending: Emergency Medicine | Admitting: Emergency Medicine

## 2017-11-05 DIAGNOSIS — R45851 Suicidal ideations: Secondary | ICD-10-CM | POA: Diagnosis not present

## 2017-11-05 DIAGNOSIS — Z79899 Other long term (current) drug therapy: Secondary | ICD-10-CM | POA: Diagnosis not present

## 2017-11-05 DIAGNOSIS — F1910 Other psychoactive substance abuse, uncomplicated: Secondary | ICD-10-CM | POA: Insufficient documentation

## 2017-11-05 DIAGNOSIS — Z008 Encounter for other general examination: Secondary | ICD-10-CM | POA: Insufficient documentation

## 2017-11-05 DIAGNOSIS — R440 Auditory hallucinations: Secondary | ICD-10-CM

## 2017-11-05 DIAGNOSIS — F17210 Nicotine dependence, cigarettes, uncomplicated: Secondary | ICD-10-CM | POA: Insufficient documentation

## 2017-11-05 DIAGNOSIS — F209 Schizophrenia, unspecified: Secondary | ICD-10-CM | POA: Insufficient documentation

## 2017-11-05 DIAGNOSIS — F200 Paranoid schizophrenia: Secondary | ICD-10-CM

## 2017-11-05 LAB — COMPREHENSIVE METABOLIC PANEL
ALT: 42 U/L (ref 0–44)
AST: 106 U/L — AB (ref 15–41)
Albumin: 4.4 g/dL (ref 3.5–5.0)
Alkaline Phosphatase: 73 U/L (ref 38–126)
Anion gap: 15 (ref 5–15)
BILIRUBIN TOTAL: 0.1 mg/dL — AB (ref 0.3–1.2)
BUN: 12 mg/dL (ref 6–20)
CALCIUM: 9.6 mg/dL (ref 8.9–10.3)
CHLORIDE: 100 mmol/L (ref 98–111)
CO2: 26 mmol/L (ref 22–32)
CREATININE: 0.78 mg/dL (ref 0.61–1.24)
GFR calc Af Amer: >60 mL/min (ref >60)
Glucose, Bld: 107 mg/dL — ABNORMAL HIGH (ref 70–99)
Potassium: 3.6 mmol/L (ref 3.5–5.1)
Sodium: 141 mmol/L (ref 135–145)
TOTAL PROTEIN: 7.9 g/dL (ref 6.5–8.1)

## 2017-11-05 LAB — CBC WITH DIFFERENTIAL/PLATELET
BASOS ABS: 0 10*3/uL (ref 0.0–0.1)
Basophils Relative: 0 %
EOS ABS: 0.1 10*3/uL (ref 0.0–0.7)
Eosinophils Relative: 1 %
HEMATOCRIT: 43.5 % (ref 39.0–52.0)
Hemoglobin: 14.8 g/dL (ref 13.0–17.0)
LYMPHS ABS: 3.2 10*3/uL (ref 0.7–4.0)
Lymphocytes Relative: 28 %
MCH: 32 pg (ref 26.0–34.0)
MCHC: 34 g/dL (ref 30.0–36.0)
MCV: 94 fL (ref 78.0–100.0)
MONO ABS: 0.8 10*3/uL (ref 0.1–1.0)
Monocytes Relative: 7 %
NEUTROS ABS: 7.5 10*3/uL (ref 1.7–7.7)
Neutrophils Relative %: 64 %
PLATELETS: 333 10*3/uL (ref 150–400)
RBC: 4.63 MIL/uL (ref 4.22–5.81)
RDW: 12.9 % (ref 11.5–15.5)
WBC: 11.6 10*3/uL — AB (ref 4.0–10.5)

## 2017-11-05 LAB — RAPID URINE DRUG SCREEN, HOSP PERFORMED
Amphetamines: NOT DETECTED
BARBITURATES: NOT DETECTED
Benzodiazepines: NOT DETECTED
COCAINE: NOT DETECTED
OPIATES: NOT DETECTED
Tetrahydrocannabinol: POSITIVE — AB

## 2017-11-05 LAB — URINALYSIS, ROUTINE W REFLEX MICROSCOPIC
BILIRUBIN URINE: NEGATIVE
Glucose, UA: NEGATIVE mg/dL
Hgb urine dipstick: NEGATIVE
KETONES UR: NEGATIVE mg/dL
Leukocytes, UA: NEGATIVE
NITRITE: NEGATIVE
PH: 5 (ref 5.0–8.0)
Protein, ur: NEGATIVE mg/dL
SPECIFIC GRAVITY, URINE: 1.004 — AB (ref 1.005–1.030)

## 2017-11-05 LAB — CBG MONITORING, ED: Glucose-Capillary: 139 mg/dL — ABNORMAL HIGH (ref 70–99)

## 2017-11-05 LAB — ETHANOL: ALCOHOL ETHYL (B): 114 mg/dL — AB (ref <10)

## 2017-11-05 MED ORDER — DIPHENHYDRAMINE HCL 25 MG PO CAPS
25.0000 mg | ORAL_CAPSULE | Freq: Every evening | ORAL | Status: DC | PRN
  Administered 2017-11-05: 25 mg via ORAL
  Filled 2017-11-05: qty 1

## 2017-11-05 MED ORDER — NICOTINE 21 MG/24HR TD PT24
21.0000 mg | MEDICATED_PATCH | Freq: Every day | TRANSDERMAL | Status: DC
  Administered 2017-11-05: 21 mg via TRANSDERMAL
  Filled 2017-11-05: qty 1

## 2017-11-05 MED ORDER — ONDANSETRON HCL 4 MG PO TABS: 4.0000 mg | ORAL_TABLET | Freq: Three times a day (TID) | ORAL | Status: DC | PRN

## 2017-11-05 MED ORDER — ALUM & MAG HYDROXIDE-SIMETH 200-200-20 MG/5ML PO SUSP: 30.0000 mL | Freq: Four times a day (QID) | ORAL | Status: DC | PRN

## 2017-11-05 MED ORDER — HALOPERIDOL 5 MG PO TABS
5.0000 mg | ORAL_TABLET | Freq: Once | ORAL | Status: AC
  Administered 2017-11-05: 5 mg via ORAL
  Filled 2017-11-05: qty 1

## 2017-11-05 NOTE — ED Triage Notes (Signed)
Pt to room #27. Pt behavior cooperative. Pt endorsing AH command in nature, Reports voices are telling him to harm himself. Pt endorsing SI. Denies VH/HI. Reports he has been off medication d/t he lost his RX when he was d/c from hospital a couple weeks ago.

## 2017-11-05 NOTE — ED Notes (Addendum)
Pt oriented to room and unit.  Pt is pleasant and cooperative.  Pt complains of Audio hallucinations.  Pt denies S/I and H/I.  All belongings were locked in locker 34.  15 minute checks and video monitoring in place.

## 2017-11-05 NOTE — ED Provider Notes (Signed)
[facility]-EMERGENCY DEPT Provider Note   CSN: [id number] Arrival date & time: 11/05/17  1712     History   Chief Complaint Chief Complaint  Patient presents with  . Suicidal    AH    HPI Edwin Henry is a 57 y.o. male.  57 y/o male with a PMH of schizophrenia presents to the ED complaining of auditory and visual hallucinations which began yesterday.  Patient states he was seen here in the hospital 10 days ago and sent home with medications however he states he lost his medications and reports he has not been taking his medications in the past 7 days.  He reports auditory hallucinations which are telling him "cut my wrist ", "jump in front of coming traffic ".  He also reports he has not been able to sleep because his auditory hallucinations are worsening as the days go by.  Denies any homicidal ideations, chest pain, shortness of breath, abdominal pain.      No past medical history on file.  Patient Active Problem List   Diagnosis Date Noted  . Auditory hallucinations 10/26/2017  . Schizophrenia, unspecified (HCC) 10/10/2017    Past Surgical History:  Procedure Laterality Date  . KNEE SURGERY          Home Medications    Prior to Admission medications   Medication Sig Start Date End Date Taking? Authorizing Provider  benztropine (COGENTIN) 1 MG tablet Take 1 tablet (1 mg total) by mouth 2 (two) times daily. For prevention of drug induced tremors 10/19/17  Yes Nwoko, Agnes I, NP  diphenhydramine-acetaminophen (TYLENOL PM) 25-500 MG TABS tablet Take 1 tablet by mouth at bedtime as needed (sleep).   Yes [provider]  haloperidol (HALDOL) 5 MG tablet Take 1 tablet (5 mg) by mouth in the morning & 2 tablet (10 mg) at bedtime: For mood control 10/19/17  Yes Armandina Stammer I, NP  sertraline (ZOLOFT) 50 MG tablet Take 1 tablet (50 mg total) by mouth daily. For depression 10/20/17  Yes Armandina Stammer I, NP  hydrOXYzine (ATARAX/VISTARIL) 50 MG  tablet Take 1 tablet (50 mg total) by mouth every 6 (six) hours as needed for anxiety. Patient not taking: Reported on 10/26/2017 10/19/17   Armandina Stammer I, NP  nicotine (NICODERM CQ - DOSED IN MG/24 HOURS) 21 mg/24hr patch Place 1 patch (21 mg total) onto the skin daily. (May buy from over the counter): For smoking cessation Patient not taking: Reported on 10/26/2017 10/20/17   Armandina Stammer I, NP  traZODone (DESYREL) 100 MG tablet Take 1 tablet (100 mg total) by mouth at bedtime. For sleep Patient not taking: Reported on 10/26/2017 10/19/17   Armandina Stammer I, NP    Family History No family history on file.  Social History Social History   Tobacco Use  . Smoking status: Current Every Day Smoker    Packs/day: 0.50  . Smokeless tobacco: Never Used  Substance Use Topics  . Alcohol use: Yes    Comment: 1-2 beers every 2 days  . Drug use: Yes    Types: Marijuana    Comment: occasional THC use     Allergies   Patient has no known allergies.   Review of Systems Review of Systems  Constitutional: Negative for chills and fever.  HENT: Negative for rhinorrhea and sore throat.   Respiratory: Negative for chest tightness and shortness of breath.   Cardiovascular: Negative for chest pain and palpitations.  Gastrointestinal: Negative for abdominal pain, nausea and  vomiting.  Genitourinary: Negative for dysuria and flank pain.  Musculoskeletal: Negative for back pain and neck pain.  Skin: Negative for pallor and wound.  Neurological: Negative for tremors, weakness, light-headedness and headaches.  Psychiatric/Behavioral: Positive for hallucinations, self-injury and sleep disturbance.  All other systems reviewed and are negative.    Physical Exam Updated Vital Signs BP 137/80 (BP Location: Left Arm)   Pulse (!) 107   Temp 98.2 F (36.8 C) (Oral)   Resp 20   SpO2 95%   Physical Exam  Constitutional: He is oriented to person, place, and time. He appears well-developed and  well-nourished.  Patient standing at the door with arms above his head pacing  HENT:  Head: Normocephalic and atraumatic.  Neck: Normal range of motion. Neck supple.  Cardiovascular: Normal heart sounds.  Pulmonary/Chest: Effort normal and breath sounds normal.  Abdominal: Soft. Bowel sounds are normal.  Musculoskeletal: He exhibits no tenderness or deformity.  Neurological: He is alert and oriented to person, place, and time.  Skin: Skin is warm and dry.  Psychiatric: His mood appears anxious. His speech is rapid and/or pressured. He is agitated and actively hallucinating.  Nursing note and vitals reviewed.    ED Treatments / Results  Labs (all labs ordered are listed, but only abnormal results are displayed) Labs Reviewed  URINALYSIS, ROUTINE W REFLEX MICROSCOPIC - Abnormal; Notable for the following components:      Result Value   Specific Gravity, Urine 1.004 (*)    All other components within normal limits  RAPID URINE DRUG SCREEN, HOSP PERFORMED - Abnormal; Notable for the following components:   Tetrahydrocannabinol POSITIVE (*)    All other components within normal limits  COMPREHENSIVE METABOLIC PANEL - Abnormal; Notable for the following components:   Glucose, Bld 107 (*)    AST 106 (*)    Total Bilirubin 0.1 (*)    All other components within normal limits  ETHANOL - Abnormal; Notable for the following components:   Alcohol, Ethyl (B) 114 (*)    All other components within normal limits  CBC WITH DIFFERENTIAL/PLATELET - Abnormal; Notable for the following components:   WBC 11.6 (*)    All other components within normal limits  CBG MONITORING, ED - Abnormal; Notable for the following components:   Glucose-Capillary 139 (*)    All other components within normal limits    EKG None  Radiology No results found.  Procedures Procedures (including critical care time)  Medications Ordered in ED Medications  nicotine (NICODERM CQ - dosed in mg/24 hours) patch 21  mg (21 mg Transdermal Patch Applied 11/05/17 1823)  ondansetron (ZOFRAN) tablet 4 mg (has no administration in time range)  alum & mag hydroxide-simeth (MAALOX/MYLANTA) 200-200-20 MG/5ML suspension 30 mL (has no administration in time range)  haloperidol (HALDOL) tablet 5 mg (5 mg Oral Given 11/05/17 1823)     Initial Impression / Assessment and Plan / ED Course  I have reviewed the triage vital signs and the nursing notes.  Pertinent labs & imaging results that were available during my care of the patient were reviewed by me and considered in my medical decision making (see chart for details).     Patient presents for auditory and visual hallucinations.  Reports he was seen here 10 days ago and sent home with a prescription for medication but lost his prescription.  He states he has been out of medication for the past 7 days and has not been taking anything, he reports hallucinations auditory are  telling him to kill himself and to jump in front of traffic.  CBC showed slight elevation of leukocytosis, CMP is within normal limits AST is a little elevated.  Rapid drug screen showed positive for THC.  No level was 114.  Urinalysis showed no nitrates, leukocytosis, white blood cell count.  He denies any urinary symptoms.  At this time patient states he is not in any pain but would like to sleep and have his hallucinations be under control.  Provide patient with Haldol to help with hallucinations.  He is medically clear to be evaluated by TTS.  Per behavioral counselor note patient is to be seen by psychiatry in the morning after being observed through the night in the ED behavioral unit.    Final Clinical Impressions(s) / ED Diagnoses   Final diagnoses:  Auditory hallucinations    ED Discharge Orders    None       Claude Manges, [place] 11/05/17 2015    Tegeler, Canary Brim, MD 11/05/17 2330

## 2017-11-05 NOTE — ED Notes (Signed)
Pt noted SI/AH. Denies HI/VH. Pt noted he is unable to sleep at night. Pt notes, "I have not slept in two days." Staff will continue to monitor and meet needs.

## 2017-11-05 NOTE — BH Assessment (Signed)
[facility] Assessment Progress Note  Case was staffed with Anselm Jungling FNP who recommended patient be observed and monitored for safety. Patient will be seen by psychiatry in the a.m.

## 2017-11-05 NOTE — ED Notes (Signed)
Provider at bedside

## 2017-11-05 NOTE — ED Notes (Signed)
Bed: [facility]34 Expected date:  Expected time:  Means of arrival:  Comments: 

## 2017-11-05 NOTE — ED Notes (Signed)
Bed: WA27 Expected date:  Expected time:  Means of arrival:  Comments: EMS-psych eval 

## 2017-11-05 NOTE — BH Assessment (Addendum)
Assessment Note  Edwin Henry is an 57 y.o. male that presents this date with S/I. Patient reports ongoing AH that are command telling him to "cut his wrists." Patient states he also is experiencing VH although will not elaborate on content stating he "just sees stuff." Patient denies any H/I or any other self injurious behaviors. Patient renders a conflicting history stating he is currently receiving OP services from [facility] although "lost his prescriptions" a week ago when he last saw that provider. Patient then stated he hasn't had any medication interventions or prescribed any medications since he was last seen at [facility]. Per chart review patient has multiple noted episodes with the last assessment on 10/25/17 when he presented to [facility] ED with similar symptoms. Patient reports current SA use to include daily use of alcohol stating he consumes 2 to 4 twelve ounce beers daily with last use on 11/04/17 when he reported he consumed two 12 ounce beers. Patient has a history of cocaine and cannabis use although denies current use. Patient's UDS and BAL are pending. Patient denies any current withdrawals. Patient is alert and oriented x 4 and presents with a pleasant affect. Per notes, patient has a history of schizophrenia presenting to the ED complaining of auditory and visual hallucinations which began yesterday. Patient states he was seen here in the hospital 10 days ago and sent home with medications however he states he lost his medications and reports he has not been taking his medications in the past 7 days. He reports auditory hallucinations which are telling him "cut my wrist ", "jump in front of coming traffic ".  He also reports he has not been able to sleep because his auditory hallucinations are worsening as the days go by.  Denies any homicidal ideations. Case was staffed with Anselm Jungling FNP who recommended patient be observed and monitored for safety. Patient will be seen by psychiatry in the  a.m.  Diagnosis:  F20.9 Schizophrenia (per notes) Polysubstance abuse  Past Medical History: No past medical history on file.  Past Surgical History:  Procedure Laterality Date  . KNEE SURGERY      Family History: No family history on file.  Social History:  reports that he has been smoking. He has been smoking about 0.50 packs per day. He has never used smokeless tobacco. He reports that he drinks alcohol. He reports that he has current or past drug history. Drug: Marijuana.  Additional Social History:  Alcohol / Drug Use Pain Medications: See MAR Prescriptions: See MAR Over the Counter: See MAR History of alcohol / drug use?: Yes Longest period of sobriety (when/how long): unknown Negative Consequences of Use: (Denies) Withdrawal Symptoms: (Denies) Substance #1 Name of Substance 1: alcohol 1 - Age of First Use: 15 1 - Amount (size/oz): 1-2 40 oz beers 1 - Frequency: Pt states "when I have money"  1 - Duration: 42 years 1 - Last Use / Amount: 11/03/17  2 12  oz beers  Substance #2 Name of Substance 2: cocaine 2 - Age of First Use: unknown 2 - Amount (size/oz): unknown 2 - Frequency: Pt states he is currently maintaining his sobriety  2 - Duration: unknown 2 - Last Use / Amount: Pt states over one month ago UDS pending Substance #3 Name of Substance 3: Marijuana 3 - Age of First Use: unknown 3 - Amount (size/oz): unknown 3 - Frequency: Pt states he is currently maintaining his sobriety 3 - Duration: unknown 3 - Last Use / Amount: Pt states  over one month ago UDS pending  CIWA: CIWA-Ar BP: 137/80 Pulse Rate: (!) 107 COWS:    Allergies: No Known Allergies  Home Medications:  (Not in a hospital admission)  OB/GYN Status:  No LMP for male patient.  General Assessment Data Location of Assessment: [facility] TTS Assessment: In system Is this a Tele or Face-to-Face Assessment?: Face-to-Face Is this an Initial Assessment or a Re-assessment for this encounter?: Initial  Assessment Patient Accompanied by:: (NA) Language Other than English: No Living Arrangements: (Homeless) What gender do you identify as?: Male Marital status: Separated Maiden name: (NA) Pregnancy Status: No Living Arrangements: Alone Can pt return to current living arrangement?: Yes Admission Status: Voluntary Is patient capable of signing voluntary admission?: Yes Referral Source: Self/Family/Friend Insurance type: Medicaid     Crisis Care Plan Living Arrangements: Alone Legal Guardian: (NA) Name of Psychiatrist: None Name of Therapist: None  Education Status Is patient currently in school?: No Highest grade of school patient has completed: (NA) Is the patient employed, unemployed or receiving disability?: Receiving disability income  Risk to self with the past 6 months Suicidal Ideation: Yes-Currently Present Has patient been a risk to self within the past 6 months prior to admission? : Yes Suicidal Intent: Yes-Currently Present Has patient had any suicidal intent within the past 6 months prior to admission? : No Is patient at risk for suicide?: Yes Suicidal Plan?: Yes-Currently Present Has patient had any suicidal plan within the past 6 months prior to admission? : Yes Specify Current Suicidal Plan: Cut wrist  Access to Means: Yes Specify Access to Suicidal Means: Pt has sharps What has been your use of drugs/alcohol within the last 12 months?: Current use Previous Attempts/Gestures: No How many times?: 0 Other Self Harm Risks: NA Triggers for Past Attempts: Unknown Intentional Self Injurious Behavior: None Comment - Self Injurious Behavior: NA Family Suicide History: No Recent stressful life event(s): Other (Comment)(Off medications) Persecutory voices/beliefs?: Yes Depression: Yes Depression Symptoms: Feeling worthless/self pity Substance abuse history and/or treatment for substance abuse?: Yes Suicide prevention information given to non-admitted patients:  Not applicable  Risk to Others within the past 6 months Homicidal Ideation: No Does patient have any lifetime risk of violence toward others beyond the six months prior to admission? : No Thoughts of Harm to Others: No Current Homicidal Intent: No Current Homicidal Plan: No Access to Homicidal Means: No Identified Victim: NA History of harm to others?: No Assessment of Violence: None Noted Violent Behavior Description: NA Does patient have access to weapons?: No Criminal Charges Pending?: No Does patient have a court date: No Is patient on probation?: No  Psychosis Hallucinations: Auditory, Visual Delusions: None noted  Mental Status Report Appearance/Hygiene: In scrubs Eye Contact: Fair Motor Activity: Unremarkable Speech: Logical/coherent Level of Consciousness: Alert Mood: Depressed Affect: Appropriate to circumstance Anxiety Level: Minimal Thought Processes: Coherent, Relevant Judgement: Partial Orientation: Person, Place, Time Obsessive Compulsive Thoughts/Behaviors: None  Cognitive Functioning Concentration: Good Memory: Recent Intact, Remote Intact Is patient IDD: No Insight: Poor Impulse Control: Poor Appetite: Fair Have you had any weight changes? : No Change Amount of the weight change? (lbs): 0 lbs Sleep: No Change Total Hours of Sleep: 6 Vegetative Symptoms: None  ADLScreening [facility] Assessment Services) Patient's cognitive ability adequate to safely complete daily activities?: Yes Patient able to express need for assistance with ADLs?: Yes Independently performs ADLs?: Yes (appropriate for developmental age)  Prior Inpatient Therapy Prior Inpatient Therapy: Yes Prior Therapy Dates: 2019, 2018 Prior Therapy Facilty/Provider(s): [facility],  [facility]  Prior Outpatient Therapy Prior Outpatient Therapy: Yes Prior Therapy Dates: Ongoing Prior Therapy Facilty/Provider(s): [name] Reason for Treatment: Psychosis Does patient have an ACCT team?:  No Does patient have Intensive In-House Services?  : No Does patient have [facility] services? : No Does patient have P4CC services?: No  ADL Screening (condition at time of admission) Patient's cognitive ability adequate to safely complete daily activities?: Yes Is the patient deaf or have difficulty hearing?: No Does the patient have difficulty seeing, even when wearing glasses/contacts?: No Does the patient have difficulty concentrating, remembering, or making decisions?: No Patient able to express need for assistance with ADLs?: Yes Does the patient have difficulty dressing or bathing?: No Independently performs ADLs?: Yes (appropriate for developmental age) Does the patient have difficulty walking or climbing stairs?: No Weakness of Legs: None Weakness of Arms/Hands: None  Home Assistive Devices/Equipment Home Assistive Devices/Equipment: None  Therapy Consults (therapy consults require a physician order) PT Evaluation Needed: No OT Evalulation Needed: No SLP Evaluation Needed: No Abuse/Neglect Assessment (Assessment to be complete while patient is alone) Physical Abuse: Denies Verbal Abuse: Denies Sexual Abuse: Denies Exploitation of patient/patient's resources: Denies Self-Neglect: Denies Values / Beliefs Cultural Requests During Hospitalization: None Spiritual Requests During Hospitalization: None Consults Spiritual Care Consult Needed: No Social Work Consult Needed: No Merchant navy officer (For Healthcare) Does Patient Have a Medical Advance Directive?: No Would patient like information on creating a medical advance directive?: No - Patient declined          Disposition:  Case was staffed with Anselm Jungling FNP who recommended patient be observed and monitored for safety. Patient will be seen by psychiatry in the a.m.  Disposition Initial Assessment Completed for this Encounter: Yes Disposition of Patient: (Observe and monitor) Type of inpatient treatment program:  (NA) Patient refused recommended treatment: No Mode of transportation if patient is discharged?: Loreli Slot)  On Site Evaluation by:   Reviewed with Physician:    Alfredia Ferguson 11/05/2017 6:24 PM

## 2017-11-06 MED ORDER — SERTRALINE HCL 50 MG PO TABS: 50.0000 mg | ORAL_TABLET | Freq: Every day | ORAL | 0 refills | Status: DC

## 2017-11-06 MED ORDER — HALOPERIDOL 5 MG PO TABS: ORAL_TABLET | ORAL | 0 refills | Status: DC

## 2017-11-06 NOTE — Progress Notes (Signed)
Patient asking nurse to be discharged prior to rounds this morning. He is known to the psychiatry service. He denies SI, HI or AVH and is able to safety plan. He plans to follow up with his outpatient provider and is safe for discharge.  Juanetta Beets, DO 11/06/17 11:59 AM

## 2017-11-06 NOTE — ED Notes (Addendum)
Pt slept about 4 hours, but feels that he has not slept and noted the benadryl not effective.

## 2017-11-06 NOTE — BH Assessment (Signed)
[facility] Assessment Progress Note  Per Jacqueline Norman, DO, this pt does not require psychiatric hospitalization at this time.  Pt is to be discharged from [facility] with recommendation to continue treatment with [facility].  This has been included in pt's discharge instructions.  Pt's nurse, Cynthia, has been notified.  Tyriek Hofman, MA Triage Specialist [phone]     

## 2017-11-06 NOTE — ED Notes (Signed)
Patient given meal tray but then placed it outside his door and did not eat.

## 2017-11-06 NOTE — ED Notes (Signed)
Patient calm and cooperative.  Came up to nurse's station several times saying he wanted to go home.  Patient notified that psychiatrist will be seeing him this morning and making that decision.

## 2017-11-06 NOTE — Discharge Instructions (Signed)
For your behavioral health needs, you are advised to continue treatment with [facility]: ° °     [facility] °     [address] °     [place], [place] [address] °     [phone] °

## 2018-05-01 ENCOUNTER — Emergency Department (HOSPITAL_COMMUNITY)
Admission: EM | Admit: 2018-05-01 | Discharge: 2018-05-02 | Disposition: A | Discharge status: Home / Self Care | Payer: Medicaid Other | Attending: Emergency Medicine | Admitting: Emergency Medicine

## 2018-05-01 ENCOUNTER — Encounter (HOSPITAL_COMMUNITY): Payer: Self-pay | Admitting: Obstetrics and Gynecology

## 2018-05-01 ENCOUNTER — Emergency Department (HOSPITAL_COMMUNITY): Payer: Medicaid Other

## 2018-05-01 ENCOUNTER — Other Ambulatory Visit: Payer: Self-pay

## 2018-05-01 DIAGNOSIS — M25562 Pain in left knee: Secondary | ICD-10-CM | POA: Diagnosis not present

## 2018-05-01 DIAGNOSIS — Z79899 Other long term (current) drug therapy: Secondary | ICD-10-CM | POA: Insufficient documentation

## 2018-05-01 DIAGNOSIS — R443 Hallucinations, unspecified: Secondary | ICD-10-CM | POA: Diagnosis not present

## 2018-05-01 DIAGNOSIS — F251 Schizoaffective disorder, depressive type: Secondary | ICD-10-CM | POA: Diagnosis not present

## 2018-05-01 DIAGNOSIS — F17200 Nicotine dependence, unspecified, uncomplicated: Secondary | ICD-10-CM | POA: Insufficient documentation

## 2018-05-01 DIAGNOSIS — F209 Schizophrenia, unspecified: Secondary | ICD-10-CM | POA: Diagnosis present

## 2018-05-01 HISTORY — DX: Schizophrenia, unspecified: F20.9

## 2018-05-01 LAB — RAPID URINE DRUG SCREEN, HOSP PERFORMED
AMPHETAMINES: NOT DETECTED
BARBITURATES: NOT DETECTED
Benzodiazepines: NOT DETECTED
Cocaine: POSITIVE — AB
Opiates: NOT DETECTED
TETRAHYDROCANNABINOL: NOT DETECTED

## 2018-05-01 LAB — COMPREHENSIVE METABOLIC PANEL
ALT: 14 U/L (ref 0–44)
AST: 17 U/L (ref 15–41)
Albumin: 4.8 g/dL (ref 3.5–5.0)
Alkaline Phosphatase: 80 U/L (ref 38–126)
Anion gap: 10 (ref 5–15)
BUN: 13 mg/dL (ref 6–20)
CHLORIDE: 102 mmol/L (ref 98–111)
CO2: 29 mmol/L (ref 22–32)
CREATININE: 0.63 mg/dL (ref 0.61–1.24)
Calcium: 10.3 mg/dL (ref 8.9–10.3)
Glucose, Bld: 94 mg/dL (ref 70–99)
POTASSIUM: 4.7 mmol/L (ref 3.5–5.1)
Sodium: 141 mmol/L (ref 135–145)
TOTAL PROTEIN: 8.3 g/dL — AB (ref 6.5–8.1)
Total Bilirubin: 1.1 mg/dL (ref 0.3–1.2)

## 2018-05-01 LAB — CBC
HEMATOCRIT: 47.8 % (ref 39.0–52.0)
Hemoglobin: 15 g/dL (ref 13.0–17.0)
MCH: 29.9 pg (ref 26.0–34.0)
MCHC: 31.4 g/dL (ref 30.0–36.0)
MCV: 95.2 fL (ref 80.0–100.0)
Platelets: 309 10*3/uL (ref 150–400)
RBC: 5.02 MIL/uL (ref 4.22–5.81)
RDW: 16.4 % — ABNORMAL HIGH (ref 11.5–15.5)
WBC: 10.7 10*3/uL — ABNORMAL HIGH (ref 4.0–10.5)
nRBC: 0 % (ref 0.0–0.2)

## 2018-05-01 LAB — ACETAMINOPHEN LEVEL: Acetaminophen (Tylenol), Serum: <10 ug/mL — ABNORMAL LOW (ref 10–30)

## 2018-05-01 LAB — ETHANOL

## 2018-05-01 LAB — SALICYLATE LEVEL: Salicylate Lvl: <7 mg/dL (ref 2.8–30.0)

## 2018-05-01 MED ORDER — HALOPERIDOL 5 MG PO TABS
10.0000 mg | ORAL_TABLET | Freq: Every day | ORAL | Status: DC
  Administered 2018-05-01: 10 mg via ORAL
  Filled 2018-05-01: qty 2

## 2018-05-01 MED ORDER — ACETAMINOPHEN 500 MG PO TABS
1000.0000 mg | ORAL_TABLET | Freq: Once | ORAL | Status: AC
  Administered 2018-05-01: 1000 mg via ORAL
  Filled 2018-05-01: qty 2

## 2018-05-01 MED ORDER — IBUPROFEN 200 MG PO TABS
600.0000 mg | ORAL_TABLET | Freq: Once | ORAL | Status: AC
  Administered 2018-05-01: 600 mg via ORAL
  Filled 2018-05-01: qty 3

## 2018-05-01 MED ORDER — IBUPROFEN 800 MG PO TABS: 800.0000 mg | ORAL_TABLET | Freq: Once | ORAL | Status: DC

## 2018-05-01 MED ORDER — SERTRALINE HCL 50 MG PO TABS
50.0000 mg | ORAL_TABLET | Freq: Every day | ORAL | Status: DC
  Administered 2018-05-01 – 2018-05-02 (×2): 50 mg via ORAL
  Filled 2018-05-01 (×2): qty 1

## 2018-05-01 MED ORDER — BENZTROPINE MESYLATE 0.5 MG PO TABS
0.5000 mg | ORAL_TABLET | Freq: Two times a day (BID) | ORAL | Status: DC
  Administered 2018-05-01 – 2018-05-02 (×3): 0.5 mg via ORAL
  Filled 2018-05-01 (×3): qty 1

## 2018-05-01 MED ORDER — ZOLPIDEM TARTRATE 5 MG PO TABS
5.0000 mg | ORAL_TABLET | Freq: Every evening | ORAL | Status: DC | PRN
  Administered 2018-05-01: 5 mg via ORAL
  Filled 2018-05-01: qty 1

## 2018-05-01 MED ORDER — HALOPERIDOL 5 MG PO TABS
5.0000 mg | ORAL_TABLET | Freq: Every morning | ORAL | Status: DC
  Administered 2018-05-01 – 2018-05-02 (×2): 5 mg via ORAL
  Filled 2018-05-01 (×2): qty 1

## 2018-05-01 MED ORDER — IBUPROFEN 200 MG PO TABS: 600.0000 mg | ORAL_TABLET | Freq: Three times a day (TID) | ORAL | Status: DC | PRN

## 2018-05-01 MED ORDER — ONDANSETRON HCL 4 MG PO TABS: 4.0000 mg | ORAL_TABLET | Freq: Three times a day (TID) | ORAL | Status: DC | PRN

## 2018-05-01 NOTE — ED Notes (Signed)
Patient reports he is also having left knee pain as he fell about 3 weeks ago. Pt reports he has been taking tylenol frequently for the pain but he has not been feeling like it is helping.

## 2018-05-01 NOTE — ED Triage Notes (Signed)
Pt reports he is hearing voices and they are telling him to hurt himself and others. Pt denies wanting to hurt himself or anyone else at this time and states that's why he came for help.
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
## 2018-05-01 NOTE — ED Notes (Signed)
Bed: [id number] Expected date:  Expected time:  Means of arrival:  Comments: 

## 2018-05-01 NOTE — ED Notes (Signed)
Pt alert and oriented. Pt denies any pain or discomfort at this time. Pt denies any si, or hi. Pt c/o auditory hallucinations. Pt reports the voices are telling him to hurt himself. Pt contract to safety. Pt calm and cooperative. Pt safe, will continue to monitor.

## 2018-05-01 NOTE — ED Provider Notes (Signed)
[facility]-EMERGENCY DEPT Provider Note   CSN: [id number] Arrival date & time: 05/01/18  1150    History   Chief Complaint Chief Complaint  Patient presents with  . Hallucinations  . Suicidal  . Knee Pain    HPI Edwin Henry is a 58 y.o. male with history of schizophrenia who presents with auditory and visual hallucinations for the past 3 to 4 months.  He states his medications are not helping him.  He is followed at [facility] and was told that he needed to come be checked in.  Patient reports he is having auditory hallucinations that are telling him to hurt himself, such as stepping out in front of traffic.  He also reports visual hallucinations such as shadows.  Patient also reports left knee pain after falling 3 weeks ago.  He has had significant swelling.  He denies any fevers.  He was never evaluated for the injury.  Patient reports he did use a little bit marijuana recently.  He denies any other drug or alcohol use.     HPI  Past Medical History:  Diagnosis Date  . Schizophrenia [facility])     Patient Active Problem List   Diagnosis Date Noted  . Auditory hallucinations 10/26/2017  . Schizophrenia, unspecified (HCC) 10/10/2017    Past Surgical History:  Procedure Laterality Date  . KNEE SURGERY          Home Medications    Prior to Admission medications   Medication Sig Start Date End Date Taking? Authorizing Provider  benztropine (COGENTIN) 1 MG tablet Take 1 tablet (1 mg total) by mouth 2 (two) times daily. For prevention of drug induced tremors 10/19/17   Armandina Stammer I, NP  diphenhydramine-acetaminophen (TYLENOL PM) 25-500 MG TABS tablet Take 1 tablet by mouth at bedtime as needed (sleep).    [provider]  haloperidol (HALDOL) 5 MG tablet Take 1 tablet (5 mg) by mouth in the morning & 2 tablet (10 mg) at bedtime: For mood control 10/19/17   Armandina Stammer I, NP  haloperidol (HALDOL) 5 MG tablet Take 5 mg in the morning and 10 mg  nightly. 11/06/17   Cherly Beach, DO  sertraline (ZOLOFT) 50 MG tablet Take 1 tablet (50 mg total) by mouth daily. For depression 10/20/17   Armandina Stammer I, NP  sertraline (ZOLOFT) 50 MG tablet Take 1 tablet (50 mg total) by mouth daily for 7 days. 11/06/17 11/13/17  Cherly Beach, DO    Family History No family history on file.  Social History Social History   Tobacco Use  . Smoking status: Current Every Day Smoker    Packs/day: 0.50  . Smokeless tobacco: Never Used  Substance Use Topics  . Alcohol use: Yes    Comment: 1-2 beers every 2 days  . Drug use: Yes    Types: Marijuana    Comment: occasional THC use     Allergies   Patient has no known allergies.   Review of Systems Review of Systems  Constitutional: Negative for chills and fever.  HENT: Negative for facial swelling and sore throat.   Respiratory: Negative for shortness of breath.   Cardiovascular: Negative for chest pain.  Gastrointestinal: Negative for abdominal pain, nausea and vomiting.  Genitourinary: Negative for dysuria.  Musculoskeletal: Positive for arthralgias and joint swelling. Negative for back pain.  Skin: Negative for rash and wound.  Neurological: Negative for headaches.  Psychiatric/Behavioral: Positive for hallucinations and suicidal ideas. The patient is not nervous/anxious.  Physical Exam Updated Vital Signs BP (!) 162/109 (BP Location: Right Arm)   Pulse 98   Temp 98.3 F (36.8 C) (Oral)   Resp 16   Ht 6\' 2"  (1.88 m)   Wt 77.1 kg   SpO2 97%   BMI 21.83 kg/m   Physical Exam Vitals signs and nursing note reviewed.  Constitutional:      General: He is not in acute distress.    Appearance: He is well-developed. He is not diaphoretic.  HENT:     Head: Normocephalic and atraumatic.     Mouth/Throat:     Pharynx: No oropharyngeal exudate.  Eyes:     General: No scleral icterus.       Right eye: No discharge.        Left eye: No discharge.      Conjunctiva/sclera: Conjunctivae normal.     Pupils: Pupils are equal, round, and reactive to light.  Neck:     Musculoskeletal: Normal range of motion and neck supple.     Thyroid: No thyromegaly.  Cardiovascular:     Rate and Rhythm: Normal rate and regular rhythm.     Heart sounds: Normal heart sounds. No murmur. No friction rub. No gallop.   Pulmonary:     Effort: Pulmonary effort is normal. No respiratory distress.     Breath sounds: Normal breath sounds. No stridor. No wheezing or rales.  Abdominal:     General: Bowel sounds are normal. There is no distension.     Palpations: Abdomen is soft.     Tenderness: There is no abdominal tenderness. There is no guarding or rebound.  Musculoskeletal:     Comments: Significant medial effusion on the left knee.  Pain with range of motion; no warmth or erythema, tenderness to the knee throughout with pain with anterior and posterior drawer as well as McMurray's.  Patient has tenderness up to the left hip  Lymphadenopathy:     Cervical: No cervical adenopathy.  Skin:    General: Skin is warm and dry.     Coloration: Skin is not pale.     Findings: No rash.  Neurological:     Mental Status: He is alert.     Coordination: Coordination normal.  Psychiatric:        Attention and Perception: He perceives auditory and visual hallucinations.        Thought Content: Thought content includes suicidal ideation. Thought content does not include homicidal ideation. Thought content includes suicidal plan. Thought content does not include homicidal plan.      ED Treatments / Results  Labs (all labs ordered are listed, but only abnormal results are displayed) Labs Reviewed  COMPREHENSIVE METABOLIC PANEL - Abnormal; Notable for the following components:      Result Value   Total Protein 8.3 (*)    All other components within normal limits  ACETAMINOPHEN LEVEL - Abnormal; Notable for the following components:   Acetaminophen (Tylenol), Serum <10  (*)    All other components within normal limits  CBC - Abnormal; Notable for the following components:   WBC 10.7 (*)    RDW 16.4 (*)    All other components within normal limits  RAPID URINE DRUG SCREEN, HOSP PERFORMED - Abnormal; Notable for the following components:   Cocaine POSITIVE (*)    All other components within normal limits  ETHANOL  SALICYLATE LEVEL    EKG None  Radiology Dg Knee Complete 4 Views Left  Result Date: 05/01/2018 CLINICAL DATA:  Acute left knee pain after fall 3 weeks ago. EXAM: LEFT KNEE - COMPLETE 4+ VIEW COMPARISON:  None. FINDINGS: No fracture or dislocation is noted. Mild suprapatellar joint effusion is noted. Moderate patellar spurring is noted. Moderate narrowing of medial joint space is noted with osteophyte formation. Vascular calcifications are noted. IMPRESSION: Moderate degenerative joint disease is noted medially. Mild suprapatellar joint effusion. No fracture or dislocation is noted. Electronically Signed   By: Lupita Raider, M.D.   On: 05/01/2018 12:56   Dg Femur Min 2 Views Left  Result Date: 05/01/2018 CLINICAL DATA:  Left leg pain after fall 3 weeks ago. EXAM: LEFT FEMUR 2 VIEWS COMPARISON:  None. FINDINGS: There is no evidence of fracture or other focal bone lesions. Soft tissues are unremarkable. IMPRESSION: Negative. Electronically Signed   By: Lupita Raider, M.D.   On: 05/01/2018 12:57    Procedures Procedures (including critical care time)  Medications Ordered in ED Medications  haloperidol (HALDOL) tablet 5 mg (5 mg Oral Given 05/01/18 1529)    And  haloperidol (HALDOL) tablet 10 mg (has no administration in time range)  sertraline (ZOLOFT) tablet 50 mg (50 mg Oral Given 05/01/18 1529)  benztropine (COGENTIN) tablet 0.5 mg (0.5 mg Oral Given 05/01/18 1529)  ibuprofen (ADVIL,MOTRIN) tablet 600 mg (600 mg Oral Given 05/01/18 1337)     Initial Impression / Assessment and Plan / ED Course  I have reviewed the triage vital signs  and the nursing notes.  Pertinent labs & imaging results that were available during my care of the patient were reviewed by me and considered in my medical decision making (see chart for details).        Patient presenting for evaluation of hallucinations and suicidal ideation.  TTS recommends a.m. psych eval.  X-ray of the left knee shows moderate effusion, but no fracture dislocation.  Suspect soft tissue injury and will give referral to orthopedics.  Patient also provided knee sleeve.  NSAIDs and ice discussed and will be recommended throughout patient's stay.  Also monitor blood pressure throughout stay and consider treatment if continued elevation, however suspect due to pain or stress.  Patient is medically cleared.  Final Clinical Impressions(s) / ED Diagnoses   Final diagnoses:  Acute pain of left knee  Hallucinations    ED Discharge Orders    None       Emi Holes, Cordelia Poche 05/01/18 1538    Donnetta Hutching, MD 05/03/18 [phone]

## 2018-05-01 NOTE — Patient Outreach (Signed)
ED Peer Support Specialist Patient Intake (Complete at intake & 30-60 Day Follow-up)  Name: Edwin Henry  MRN: [id number]  Age: 58 y.o.   Date of Admission: 05/01/2018  Intake: Initial Comments:      Primary Reason Admitted: Hallucinations   Lab values: Alcohol/ETOH: Negative Positive UDS? No Amphetamines: No Barbiturates: No Benzodiazepines: No Cocaine: No Opiates: No Cannabinoids: No  Demographic information: Gender: Male Ethnicity: Other (comment) Marital Status: Single Insurance Status: Medicaid Ecologist (Work Neurosurgeon, Physicist, medical, Social research officer, government.: Yes(SSI) Lives with: Alone Living situation: Homeless  Reported Patient History: Patient reported health conditions: Depression, Schizophrenia Patient aware of HIV and hepatitis status: No  In past year, has patient visited ED for any reason? No  Number of ED visits:    Reason(s) for visit:    In past year, has patient been hospitalized for any reason? No  Number of hospitalizations:    Reason(s) for hospitalization:    In past year, has patient been arrested? No  Number of arrests:    Reason(s) for arrest:    In past year, has patient been incarcerated? No  Number of incarcerations:    Reason(s) for incarceration:    In past year, has patient received medication-assisted treatment? No  In past year, patient received the following treatments: Group therapy([facility] )  In past year, has patient received any harm reduction services? No  Did this include any of the following?    In past year, has patient received care from a mental health provider for diagnosis other than SUD? Yes  In past year, is this first time patient has overdosed? No  Number of past overdoses:    In past year, is this first time patient has been hospitalized for an overdose? No  Number of hospitalizations for overdose(s):    Is patient currently receiving treatment for a mental health diagnosis?  Yes(On medications but stated it is not working. )  Patient reports experiencing difficulty participating in SUD treatment: No    Most important reason(s) for this difficulty?    Has patient received prior services for treatment? No  In past, patient has received services from following agencies:    Plan of Care:  Suggested follow up at these agencies/treatment centers: ([facility] for mental health services. )  Other information: CPSS met with Pt and was made aware that he was not doing well at all due to the voices in his head. CPSS addressed the concerns that he felt were hard for him to deal with. Pt stated that he has [facility] services but he felt he needed help right now for the voices. CPSS was made aware that Pt will receive his medications an will stay for observation over night. CPSS will follow up with Pt to monitor services in the Am and possible fax him out to a few facilities.    Aaron Edelman Chinedu Agustin, CPSS  05/01/2018 2:10 PM

## 2018-05-01 NOTE — BH Assessment (Signed)
BHH Assessment Progress Note  Per Elta Guadeloupe, FNP this pt is to be held at [facility] overnight for further observation and stabilization.  Pt's nurse, Kendal Hymen, has been notified.  Doylene Canning, [place] Behavioral Health Coordinator [phone]

## 2018-05-01 NOTE — ED Notes (Signed)
Patient is complaining of left knee pain.  When offered the tylenol he refused and stated, "I can get that at home,  I know I can get something stronger so I dont want that."

## 2018-05-01 NOTE — ED Notes (Signed)
Pt changed his mind and took tylenol.  Knee iced .

## 2018-05-02 DIAGNOSIS — F209 Schizophrenia, unspecified: Secondary | ICD-10-CM

## 2018-05-02 DIAGNOSIS — R443 Hallucinations, unspecified: Secondary | ICD-10-CM

## 2018-05-02 DIAGNOSIS — F3289 Other specified depressive episodes: Secondary | ICD-10-CM

## 2018-05-02 DIAGNOSIS — F1490 Cocaine use, unspecified, uncomplicated: Secondary | ICD-10-CM

## 2018-05-02 MED ORDER — HALOPERIDOL 5 MG PO TABS: 5.0000 mg | ORAL_TABLET | Freq: Every morning | ORAL | 0 refills | Status: DC

## 2018-05-02 MED ORDER — SERTRALINE HCL 50 MG PO TABS: 50.0000 mg | ORAL_TABLET | Freq: Every day | ORAL | 0 refills | Status: DC

## 2018-05-02 MED ORDER — HALOPERIDOL 10 MG PO TABS: 10.0000 mg | ORAL_TABLET | Freq: Every day | ORAL | 0 refills | Status: DC

## 2018-05-02 NOTE — Discharge Instructions (Signed)
For your mental health needs, you are advised to continue treatment with [facility]:       [facility]      [address]      [place], [place] [address]      [phone]      Prescription refills: [phone] or [phone]

## 2018-05-02 NOTE — Consult Note (Addendum)
[facility] ED Discharge  05/02/2018 10:33 AM Edwin Henry  MRN:  [id number] Principal Problem: Schizophrenia, unspecified [facility]) Discharge Diagnoses: Principal Problem:   Schizophrenia, unspecified (HCC) Active Problems:   Hallucinations   Subjective: Pt was seen and chart reviewed with treatment team and Dr Sharma Covert. Pt denies suicidal/homicidal ideation, denies visual hallucinations and does not appear to be responding to internal stimuli. Pt stated he hears voices that tell him to harm himself. The hallucinations have been going on for a long time and appear to be chronic in nature and possibly induced by his drug use.  Pt stated he gets his medicaton at [facility] but he ran out. He also stated that [facility] told him yesterday to come to the [facility] and get checked in if he was hearing voices. He stated they did not give him any medications. His UDS was positive for cocaine and his BAL was negative. He stated he receives SSDI and lives at the [facility]. He stated his rent is paid through the end of the month. He also stated he doesn't do cocaine all the time. Pt's medications were restarted in the [facility]. Pt was seen by Peer Support yesterday and given resources for substance abuse treatment in the community. Pt will be given outpatient resources to follow up at [facility] and the pharmacy at [facility] for his medications. Pt denies any suicide attempts in the past. He will receive prescriptions for his medications upon discharge. Pt is psychiatrically clear.   Total Time spent with patient: 30 minutes  Past Psychiatric History: as above  Past Medical History:  Past Medical History:  Diagnosis Date  . Schizophrenia [facility])     Past Surgical History:  Procedure Laterality Date  . KNEE SURGERY     Family History: No family history on file. Family Psychiatric  History: None per chart review.  Social History:  Social History   Substance and Sexual Activity  Alcohol Use Yes   Comment: 1-2 beers every 2  days     Social History   Substance and Sexual Activity  Drug Use Yes  . Types: Marijuana   Comment: occasional THC use    Social History   Socioeconomic History  . Marital status: Single    Spouse name: Not on file  . Number of children: Not on file  . Years of education: Not on file  . Highest education level: Not on file  Occupational History  . Not on file  Social Needs  . Financial resource strain: Not on file  . Food insecurity:    Worry: Not on file    Inability: Not on file  . Transportation needs:    Medical: Not on file    Non-medical: Not on file  Tobacco Use  . Smoking status: Current Every Day Smoker    Packs/day: 0.50  . Smokeless tobacco: Never Used  Substance and Sexual Activity  . Alcohol use: Yes    Comment: 1-2 beers every 2 days  . Drug use: Yes    Types: Marijuana    Comment: occasional THC use  . Sexual activity: Not on file  Lifestyle  . Physical activity:    Days per week: Not on file    Minutes per session: Not on file  . Stress: Not on file  Relationships  . Social connections:    Talks on phone: Not on file    Gets together: Not on file    Attends religious service: Not on file    Active member  of club or organization: Not on file    Attends meetings of clubs or organizations: Not on file    Relationship status: Not on file  Other Topics Concern  . Not on file  Social History Narrative  . Not on file    Has this patient used any form of tobacco in the last 30 days? (Cigarettes, Smokeless Tobacco, Cigars, and/or Pipes) Prescription not provided because: Pt declined  Current Medications: Current Facility-Administered Medications  Medication Dose Route Frequency Provider Last Rate Last Dose  . benztropine (COGENTIN) tablet 0.5 mg  0.5 mg Oral BID Laveda Abbe, NP   0.5 mg at 05/02/18 0940  . haloperidol (HALDOL) tablet 5 mg  5 mg Oral q morning - 10a Laveda Abbe, NP   5 mg at 05/02/18 0940   And  . haloperidol  (HALDOL) tablet 10 mg  10 mg Oral QHS Laveda Abbe, NP   10 mg at 05/01/18 2137  . ibuprofen (ADVIL,MOTRIN) tablet 600 mg  600 mg Oral Q8H PRN Law, Alexandra M, PA-C      . ondansetron [facility]) tablet 4 mg  4 mg Oral Q8H PRN Law, Alexandra M, PA-C      . sertraline (ZOLOFT) tablet 50 mg  50 mg Oral Daily Laveda Abbe, NP   50 mg at 05/02/18 0940  . zolpidem (AMBIEN) tablet 5 mg  5 mg Oral QHS PRN Vanetta Mulders, MD   5 mg at 05/01/18 2137   Current Outpatient Medications  Medication Sig Dispense Refill  . benztropine (COGENTIN) 1 MG tablet Take 1 tablet (1 mg total) by mouth 2 (two) times daily. For prevention of drug induced tremors 60 tablet 0  . diphenhydramine-acetaminophen (TYLENOL PM) 25-500 MG TABS tablet Take 1 tablet by mouth at bedtime as needed (sleep).    . haloperidol (HALDOL) 5 MG tablet Take 1 tablet (5 mg) by mouth in the morning & 2 tablet (10 mg) at bedtime: For mood control 90 tablet 0  . haloperidol (HALDOL) 5 MG tablet Take 5 mg in the morning and 10 mg nightly. 15 tablet 0  . sertraline (ZOLOFT) 50 MG tablet Take 1 tablet (50 mg total) by mouth daily. For depression 30 tablet 0  . sertraline (ZOLOFT) 50 MG tablet Take 1 tablet (50 mg total) by mouth daily for 7 days. 7 tablet 0     Musculoskeletal: Strength & Muscle Tone: within normal limits Gait & Station: normal Patient leans: N/A  Psychiatric Specialty Exam: Physical Exam  Nursing note and vitals reviewed. Constitutional: He is oriented to person, place, and time. He appears well-developed and well-nourished.  HENT:  Head: Normocephalic and atraumatic.  Neck: Normal range of motion.  Respiratory: Effort normal.  Musculoskeletal: Normal range of motion.  Neurological: He is alert and oriented to person, place, and time.  Psychiatric: His speech is normal and behavior is normal. Judgment and thought content normal. Cognition and memory are normal. He exhibits a depressed mood.    Review of  Systems  Psychiatric/Behavioral: Positive for depression and substance abuse.  All other systems reviewed and are negative.   Blood pressure 123/83, pulse 71, temperature 98 F (36.7 C), temperature source Oral, resp. rate 16, height 6\' 2"  (1.88 m), weight 77.1 kg, SpO2 98 %.Body mass index is 21.83 kg/m.  General Appearance: Casual  Eye Contact:  Good  Speech:  Clear and Coherent and Normal Rate  Volume:  Normal  Mood:  Depressed  Affect:  Congruent and Depressed  Thought Process:  Coherent, Goal Directed, Linear and Descriptions of Associations: Intact  Orientation:  Full (Time, Place, and Person)  Thought Content:  Logical  Suicidal Thoughts:  No  Homicidal Thoughts:  No  Memory:  Immediate;   Good Recent;   Good Remote;   Fair  Judgement:  Fair  Insight:  Fair  Psychomotor Activity:  Normal  Concentration:  Concentration: Good and Attention Span: Good  Recall:  Good  Fund of Knowledge:  Good  Language:  Good  Akathisia:  No  Handed:  Right  AIMS (if indicated):   N/A  Assets:  Architect Housing Social Support  ADL's:  Intact  Cognition:  WNL  Sleep:   N/A     Demographic Factors:  Male, Low socioeconomic status and Unemployed  Loss Factors: Financial problems/change in socioeconomic status  Historical Factors: NA  Risk Reduction Factors:   Sense of responsibility to family  Continued Clinical Symptoms:  Alcohol/Substance Abuse/Dependencies  Cognitive Features That Contribute To Risk:  Closed-mindedness    Suicide Risk:  Minimal: No identifiable suicidal ideation.  Patients presenting with no risk factors but with morbid ruminations; may be classified as minimal risk based on the severity of the depressive symptoms  Follow-up Information    Schedule an appointment as soon as possible for a visit  with Tarry Kos, MD.   Specialty:  Orthopedic Surgery Why:  For further evaluation and treatment of your knee  injury Contact information: [address] [place] [address] [phone]           Plan Of Care/Follow-up recommendations:  Activity:  as tolerated Diet:  Heart healthy  Disposition and Treatment Plan: Schizophrenia, unspecified (HCC) Take all medications as prescribed by your outpatient provider. Keep all follow-up appointments as scheduled.  Do not consume alcohol or use illegal drugs while on prescription medications. Report any adverse effects from your medications to your primary care provider promptly.  In the event of recurrent symptoms or worsening symptoms, call 911, a crisis hotline, or go to the nearest emergency department for evaluation.   Laveda Abbe, NP 05/02/2018, 10:33 AM   Patient seen face-to-face for psychiatric evaluation, chart reviewed and case discussed with the physician extender and developed treatment plan. Reviewed the information documented and agree with the treatment plan.  Juanetta Beets, DO 05/02/18 11:00 AM

## 2018-05-02 NOTE — ED Notes (Signed)
Pt A&O x 3, no distress noted, calm & cooperative, resting at present.  Monitoring for safety, Q 15 min checks in effect. 

## 2018-05-02 NOTE — BH Assessment (Signed)
[facility] Assessment Progress Note  Per Juanetta Beets, DO, this pt does not require psychiatric hospitalization at this time.  Pt is to be discharged from [facility] with recommendation to continue treatment with [facility].  This has been included in pt's discharge instructions.  Pt's nurse, Joanie Coddington, has been notified.  Doylene Canning, MA Triage Specialist [phone]

## 2018-05-22 ENCOUNTER — Encounter (HOSPITAL_COMMUNITY): Payer: Self-pay | Admitting: *Deleted

## 2018-05-22 ENCOUNTER — Observation Stay (HOSPITAL_COMMUNITY)
Admission: AD | Admit: 2018-05-22 | Discharge: 2018-05-23 | Disposition: A | Discharge status: Home / Self Care | Payer: Medicaid Other | Source: Intra-hospital | Attending: Psychiatry | Admitting: Psychiatry

## 2018-05-22 ENCOUNTER — Other Ambulatory Visit: Payer: Self-pay

## 2018-05-22 ENCOUNTER — Encounter (HOSPITAL_COMMUNITY): Payer: Self-pay

## 2018-05-22 ENCOUNTER — Emergency Department (HOSPITAL_COMMUNITY)
Admission: EM | Admit: 2018-05-22 | Discharge: 2018-05-22 | Disposition: A | Discharge status: Home / Self Care | Payer: Medicaid Other | Attending: Emergency Medicine | Admitting: Emergency Medicine

## 2018-05-22 DIAGNOSIS — Z79899 Other long term (current) drug therapy: Secondary | ICD-10-CM | POA: Insufficient documentation

## 2018-05-22 DIAGNOSIS — F329 Major depressive disorder, single episode, unspecified: Secondary | ICD-10-CM | POA: Diagnosis not present

## 2018-05-22 DIAGNOSIS — Z590 Homelessness: Secondary | ICD-10-CM | POA: Insufficient documentation

## 2018-05-22 DIAGNOSIS — T43506A Underdosing of unspecified antipsychotics and neuroleptics, initial encounter: Secondary | ICD-10-CM | POA: Insufficient documentation

## 2018-05-22 DIAGNOSIS — R45851 Suicidal ideations: Secondary | ICD-10-CM | POA: Insufficient documentation

## 2018-05-22 DIAGNOSIS — R4584 Anhedonia: Secondary | ICD-10-CM | POA: Insufficient documentation

## 2018-05-22 DIAGNOSIS — F1210 Cannabis abuse, uncomplicated: Secondary | ICD-10-CM | POA: Insufficient documentation

## 2018-05-22 DIAGNOSIS — Y906 Blood alcohol level of 120-199 mg/100 ml: Secondary | ICD-10-CM | POA: Insufficient documentation

## 2018-05-22 DIAGNOSIS — R51 Headache: Secondary | ICD-10-CM | POA: Diagnosis not present

## 2018-05-22 DIAGNOSIS — R440 Auditory hallucinations: Secondary | ICD-10-CM | POA: Diagnosis not present

## 2018-05-22 DIAGNOSIS — F1410 Cocaine abuse, uncomplicated: Secondary | ICD-10-CM | POA: Diagnosis not present

## 2018-05-22 DIAGNOSIS — F12980 Cannabis use, unspecified with anxiety disorder: Secondary | ICD-10-CM | POA: Insufficient documentation

## 2018-05-22 DIAGNOSIS — F1490 Cocaine use, unspecified, uncomplicated: Secondary | ICD-10-CM | POA: Insufficient documentation

## 2018-05-22 DIAGNOSIS — F1910 Other psychoactive substance abuse, uncomplicated: Secondary | ICD-10-CM

## 2018-05-22 DIAGNOSIS — G4700 Insomnia, unspecified: Secondary | ICD-10-CM | POA: Diagnosis not present

## 2018-05-22 DIAGNOSIS — F17210 Nicotine dependence, cigarettes, uncomplicated: Secondary | ICD-10-CM | POA: Diagnosis not present

## 2018-05-22 DIAGNOSIS — F209 Schizophrenia, unspecified: Secondary | ICD-10-CM | POA: Diagnosis not present

## 2018-05-22 DIAGNOSIS — R443 Hallucinations, unspecified: Secondary | ICD-10-CM

## 2018-05-22 DIAGNOSIS — Z91128 Patient's intentional underdosing of medication regimen for other reason: Secondary | ICD-10-CM | POA: Diagnosis not present

## 2018-05-22 DIAGNOSIS — F333 Major depressive disorder, recurrent, severe with psychotic symptoms: Principal | ICD-10-CM | POA: Insufficient documentation

## 2018-05-22 DIAGNOSIS — F10120 Alcohol abuse with intoxication, uncomplicated: Secondary | ICD-10-CM | POA: Diagnosis not present

## 2018-05-22 LAB — COMPREHENSIVE METABOLIC PANEL
ALT: 17 U/L (ref 0–44)
AST: 18 U/L (ref 15–41)
Albumin: 4.5 g/dL (ref 3.5–5.0)
Alkaline Phosphatase: 70 U/L (ref 38–126)
Anion gap: 11 (ref 5–15)
BUN: 7 mg/dL (ref 6–20)
CO2: 26 mmol/L (ref 22–32)
Calcium: 9.7 mg/dL (ref 8.9–10.3)
Chloride: 102 mmol/L (ref 98–111)
Creatinine, Ser: 0.82 mg/dL (ref 0.61–1.24)
GFR calc Af Amer: >60 mL/min (ref >60)
GFR calc non Af Amer: >60 mL/min (ref >60)
Glucose, Bld: 95 mg/dL (ref 70–99)
Potassium: 3.7 mmol/L (ref 3.5–5.1)
Sodium: 139 mmol/L (ref 135–145)
Total Bilirubin: 0.7 mg/dL (ref 0.3–1.2)
Total Protein: 8 g/dL (ref 6.5–8.1)

## 2018-05-22 LAB — RAPID URINE DRUG SCREEN, HOSP PERFORMED
Amphetamines: NOT DETECTED
Barbiturates: NOT DETECTED
Benzodiazepines: NOT DETECTED
Cocaine: POSITIVE — AB
Opiates: NOT DETECTED
Tetrahydrocannabinol: NOT DETECTED

## 2018-05-22 LAB — ETHANOL: Alcohol, Ethyl (B): 163 mg/dL — ABNORMAL HIGH (ref <10)

## 2018-05-22 LAB — URINALYSIS, ROUTINE W REFLEX MICROSCOPIC
Bacteria, UA: NONE SEEN
Bilirubin Urine: NEGATIVE
Glucose, UA: NEGATIVE mg/dL
Ketones, ur: NEGATIVE mg/dL
Leukocytes,Ua: NEGATIVE
Nitrite: NEGATIVE
Protein, ur: NEGATIVE mg/dL
Specific Gravity, Urine: 1.004 — ABNORMAL LOW (ref 1.005–1.030)
pH: 5 (ref 5.0–8.0)

## 2018-05-22 LAB — CBC WITH DIFFERENTIAL/PLATELET
Abs Immature Granulocytes: 0.04 10*3/uL (ref 0.00–0.07)
Basophils Absolute: 0.1 10*3/uL (ref 0.0–0.1)
Basophils Relative: 1 %
Eosinophils Absolute: 0.1 10*3/uL (ref 0.0–0.5)
Eosinophils Relative: 1 %
HCT: 49.9 % (ref 39.0–52.0)
Hemoglobin: 15.7 g/dL (ref 13.0–17.0)
Immature Granulocytes: 0 %
Lymphocytes Relative: 20 %
Lymphs Abs: 2.8 10*3/uL (ref 0.7–4.0)
MCH: 29.8 pg (ref 26.0–34.0)
MCHC: 31.5 g/dL (ref 30.0–36.0)
MCV: 94.9 fL (ref 80.0–100.0)
Monocytes Absolute: 1 10*3/uL (ref 0.1–1.0)
Monocytes Relative: 7 %
Neutro Abs: 10.4 10*3/uL — ABNORMAL HIGH (ref 1.7–7.7)
Neutrophils Relative %: 71 %
Platelets: 319 10*3/uL (ref 150–400)
RBC: 5.26 MIL/uL (ref 4.22–5.81)
RDW: 16.1 % — ABNORMAL HIGH (ref 11.5–15.5)
WBC: 14.5 10*3/uL — ABNORMAL HIGH (ref 4.0–10.5)
nRBC: 0 % (ref 0.0–0.2)

## 2018-05-22 LAB — SALICYLATE LEVEL: Salicylate Lvl: <7 mg/dL (ref 2.8–30.0)

## 2018-05-22 LAB — ACETAMINOPHEN LEVEL: Acetaminophen (Tylenol), Serum: 10 ug/mL (ref 10–30)

## 2018-05-22 MED ORDER — HALOPERIDOL 5 MG PO TABS: 5.0000 mg | ORAL_TABLET | Freq: Every morning | ORAL | Status: DC

## 2018-05-22 MED ORDER — ALUM & MAG HYDROXIDE-SIMETH 200-200-20 MG/5ML PO SUSP: 30.0000 mL | Freq: Four times a day (QID) | ORAL | Status: DC | PRN

## 2018-05-22 MED ORDER — IBUPROFEN 400 MG PO TABS: 600.0000 mg | ORAL_TABLET | Freq: Three times a day (TID) | ORAL | Status: DC | PRN

## 2018-05-22 MED ORDER — ZOLPIDEM TARTRATE 5 MG PO TABS: 5.0000 mg | ORAL_TABLET | Freq: Every evening | ORAL | Status: DC | PRN

## 2018-05-22 MED ORDER — LORAZEPAM 1 MG PO TABS: 0.0000 mg | ORAL_TABLET | Freq: Four times a day (QID) | ORAL | Status: DC

## 2018-05-22 MED ORDER — BENZTROPINE MESYLATE 1 MG PO TABS
1.0000 mg | ORAL_TABLET | Freq: Every day | ORAL | Status: DC
  Administered 2018-05-22 – 2018-05-23 (×2): 1 mg via ORAL
  Filled 2018-05-22 (×2): qty 1

## 2018-05-22 MED ORDER — NICOTINE 21 MG/24HR TD PT24: 21.0000 mg | MEDICATED_PATCH | Freq: Every day | TRANSDERMAL | Status: DC

## 2018-05-22 MED ORDER — LORAZEPAM 2 MG/ML IJ SOLN: 0.0000 mg | Freq: Two times a day (BID) | INTRAMUSCULAR | Status: DC

## 2018-05-22 MED ORDER — IBUPROFEN 400 MG PO TABS
600.0000 mg | ORAL_TABLET | Freq: Once | ORAL | Status: AC
  Administered 2018-05-22: 600 mg via ORAL
  Filled 2018-05-22: qty 1

## 2018-05-22 MED ORDER — THIAMINE HCL 100 MG/ML IJ SOLN: 100.0000 mg | Freq: Every day | INTRAMUSCULAR | Status: DC

## 2018-05-22 MED ORDER — LORAZEPAM 2 MG/ML IJ SOLN: 0.0000 mg | Freq: Four times a day (QID) | INTRAMUSCULAR | Status: DC

## 2018-05-22 MED ORDER — VITAMIN B-1 100 MG PO TABS: 100.0000 mg | ORAL_TABLET | Freq: Every day | ORAL | Status: DC

## 2018-05-22 MED ORDER — ONDANSETRON HCL 4 MG PO TABS: 4.0000 mg | ORAL_TABLET | Freq: Three times a day (TID) | ORAL | Status: DC | PRN

## 2018-05-22 MED ORDER — SERTRALINE HCL 50 MG PO TABS
50.0000 mg | ORAL_TABLET | Freq: Every day | ORAL | Status: DC
  Administered 2018-05-23: 50 mg via ORAL
  Filled 2018-05-22: qty 1

## 2018-05-22 MED ORDER — TRAZODONE HCL 50 MG PO TABS
50.0000 mg | ORAL_TABLET | Freq: Every evening | ORAL | Status: DC | PRN
  Administered 2018-05-22: 50 mg via ORAL
  Filled 2018-05-22: qty 1

## 2018-05-22 MED ORDER — LORAZEPAM 1 MG PO TABS: 0.0000 mg | ORAL_TABLET | Freq: Two times a day (BID) | ORAL | Status: DC

## 2018-05-22 MED ORDER — HYDROXYZINE HCL 50 MG PO TABS
50.0000 mg | ORAL_TABLET | Freq: Four times a day (QID) | ORAL | Status: DC | PRN
  Administered 2018-05-22: 50 mg via ORAL
  Filled 2018-05-22: qty 1

## 2018-05-22 MED ORDER — MIRTAZAPINE 15 MG PO TABS: 30.0000 mg | ORAL_TABLET | Freq: Every day | ORAL | Status: DC

## 2018-05-22 MED ORDER — ENSURE ENLIVE PO LIQD: 237.0000 mL | Freq: Two times a day (BID) | ORAL | Status: DC

## 2018-05-22 NOTE — Progress Notes (Signed)
Observation Admission Note:  58 yr male who presents [facility] in no acute distress for the treatment of SI, Psychosis and Depression. Pt appears flat and depressed. Pt was calm and cooperative with admission process. Pt presents with passive SI/ AVH and contracts for safety upon admission. Pt stated he was feeling depressed and AVH getting worse the pas 3 months and pt has not been sleeping , so he thought he would come to get help before he did something to hurt himself. Pt stated he has been off his medications past few months.  A: Skin was assessed and found to be clear of any abnormal marks apart from multiple tattoosa. PT searched and no contraband found, POC and unit policies explained and understanding verbalized. Consents obtained. Food and fluids offered, and  Accepted.  R: Pt had no additional questions or concerns.
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
## 2018-05-22 NOTE — ED Triage Notes (Signed)
Pt reports he hears voices telling him to hur himself. Today the voices tell him to walk out in front of a car.

## 2018-05-22 NOTE — ED Notes (Signed)
Patient verbalizes understanding of discharge instructions . Opportunity for questions and answers were provided . Armband removed by staff ,Pt discharged from ED. W/C  offered at D/C  and Declined W/C at D/C and was escorted to lobby by RN.  

## 2018-05-22 NOTE — ED Triage Notes (Signed)
Dinner ordered for PT

## 2018-05-22 NOTE — ED Triage Notes (Signed)
Report called to [facility] Ronnie.[facility] called for transport to [facility]

## 2018-05-22 NOTE — ED Triage Notes (Signed)
Lindsey at [facility] call for instructions for for over night observation.

## 2018-05-22 NOTE — ED Provider Notes (Signed)
[facility] EMERGENCY DEPARTMENT Provider Note   CSN: [id number] Arrival date & time: 05/22/18  1601    History   Chief Complaint Chief Complaint  Patient presents with  . Suicidal    HPI Edwin Henry is a 58 y.o. male.     Pt presents to the ED today with hearing voices.  The pt said the voices are telling him to hurt himself and to walk in front of a car.  The pt said he's not been taking his meds because he has not been able to get an appt with [facility] during the Covid pandemic.  Pt also said he's not been able to sleep and has had a headache.  No fevers.     Past Medical History:  Diagnosis Date  . Schizophrenia [facility])     Patient Active Problem List   Diagnosis Date Noted  . Hallucinations 10/26/2017  . Schizophrenia, unspecified (HCC) 10/10/2017    Past Surgical History:  Procedure Laterality Date  . KNEE SURGERY          Home Medications    Prior to Admission medications   Medication Sig Start Date End Date Taking? Authorizing Provider  benztropine (COGENTIN) 1 MG tablet Take 1 tablet (1 mg total) by mouth 2 (two) times daily. For prevention of drug induced tremors 10/19/17   Armandina Stammer I, NP  diphenhydramine-acetaminophen (TYLENOL PM) 25-500 MG TABS tablet Take 1 tablet by mouth at bedtime as needed (sleep).    [provider]  haloperidol (HALDOL) 10 MG tablet Take 1 tablet (10 mg total) by mouth at bedtime. 05/02/18   Laveda Abbe, NP  haloperidol (HALDOL) 5 MG tablet Take 1 tablet (5 mg total) by mouth every morning. 05/03/18   Laveda Abbe, NP  sertraline (ZOLOFT) 50 MG tablet Take 1 tablet (50 mg total) by mouth daily. 05/03/18   Laveda Abbe, NP    Family History History reviewed. No pertinent family history.  Social History Social History   Tobacco Use  . Smoking status: Current Every Day Smoker    Packs/day: 0.50  . Smokeless tobacco: Never Used  Substance Use Topics  . Alcohol use:  Yes    Comment: 1-2 beers every 2 days  . Drug use: Yes    Types: Marijuana    Comment: occasional THC use     Allergies   Patient has no known allergies.   Review of Systems Review of Systems  Psychiatric/Behavioral: Positive for hallucinations, sleep disturbance and suicidal ideas.  All other systems reviewed and are negative.    Physical Exam Updated Vital Signs BP 119/79 (BP Location: Right Arm)   Pulse 95   Temp 98.2 F (36.8 C) (Oral)   Resp 16   Ht 6\' 2"  (1.88 m)   Wt 77.1 kg   SpO2 98%   BMI 21.83 kg/m   Physical Exam Vitals signs and nursing note reviewed.  Constitutional:      Appearance: Normal appearance.  HENT:     Head: Normocephalic and atraumatic.     Right Ear: External ear normal.     Left Ear: External ear normal.     Nose: Nose normal.     Mouth/Throat:     Mouth: Mucous membranes are moist.  Eyes:     Extraocular Movements: Extraocular movements intact.     Conjunctiva/sclera: Conjunctivae normal.     Pupils: Pupils are equal, round, and reactive to light.  Neck:     Musculoskeletal: Normal range  of motion.  Cardiovascular:     Rate and Rhythm: Normal rate and regular rhythm.     Pulses: Normal pulses.  Pulmonary:     Effort: Pulmonary effort is normal.  Abdominal:     General: Abdomen is flat.  Musculoskeletal: Normal range of motion.  Skin:    General: Skin is warm.     Capillary Refill: Capillary refill takes less than 2 seconds.  Neurological:     General: No focal deficit present.     Mental Status: He is alert and oriented to person, place, and time.  Psychiatric:        Attention and Perception: He perceives auditory hallucinations.        Thought Content: Thought content includes suicidal ideation.      ED Treatments / Results  Labs (all labs ordered are listed, but only abnormal results are displayed) Labs Reviewed  ETHANOL - Abnormal; Notable for the following components:      Result Value   Alcohol, Ethyl (B)  163 (*)    All other components within normal limits  RAPID URINE DRUG SCREEN, HOSP PERFORMED - Abnormal; Notable for the following components:   Cocaine POSITIVE (*)    All other components within normal limits  CBC WITH DIFFERENTIAL/PLATELET - Abnormal; Notable for the following components:   WBC 14.5 (*)    RDW 16.1 (*)    Neutro Abs 10.4 (*)    All other components within normal limits  URINALYSIS, ROUTINE W REFLEX MICROSCOPIC - Abnormal; Notable for the following components:   Specific Gravity, Urine 1.004 (*)    Hgb urine dipstick SMALL (*)    All other components within normal limits  COMPREHENSIVE METABOLIC PANEL  SALICYLATE LEVEL  ACETAMINOPHEN LEVEL    EKG None  Radiology No results found.  Procedures Procedures (including critical care time)  Medications Ordered in ED Medications  zolpidem (AMBIEN) tablet 5 mg (has no administration in time range)  LORazepam (ATIVAN) injection 0-4 mg (has no administration in time range)    Or  LORazepam (ATIVAN) tablet 0-4 mg (has no administration in time range)  LORazepam (ATIVAN) injection 0-4 mg (has no administration in time range)    Or  LORazepam (ATIVAN) tablet 0-4 mg (has no administration in time range)  thiamine (VITAMIN B-1) tablet 100 mg (has no administration in time range)    Or  thiamine (B-1) injection 100 mg (has no administration in time range)  ibuprofen (ADVIL,MOTRIN) tablet 600 mg (has no administration in time range)  zolpidem (AMBIEN) tablet 5 mg (has no administration in time range)  ondansetron (ZOFRAN) tablet 4 mg (has no administration in time range)  alum & mag hydroxide-simeth (MAALOX/MYLANTA) 200-200-20 MG/5ML suspension 30 mL (has no administration in time range)  nicotine (NICODERM CQ - dosed in mg/24 hours) patch 21 mg (has no administration in time range)  ibuprofen (ADVIL,MOTRIN) tablet 600 mg (600 mg Oral Given 05/22/18 1701)     Initial Impression / Assessment and Plan / ED Course  I  have reviewed the triage vital signs and the nursing notes.  Pertinent labs & imaging results that were available during my care of the patient were reviewed by me and considered in my medical decision making (see chart for details).     TTS will be consulted.  Disposition pending their recommendation.  The pt is placed on CIWA protocol due to the alcohol abuse.  He is not showing any signs of withdrawal now.  Pt is medically clear for  psych eval.  Final Clinical Impressions(s) / ED Diagnoses   Final diagnoses:  Suicidal ideation  Hallucinations  Polysubstance abuse [facility](HCC)    ED Discharge Orders    None       Jacalyn LefevreHaviland, Mysty Kielty, MD 05/22/18 1729

## 2018-05-23 DIAGNOSIS — F333 Major depressive disorder, recurrent, severe with psychotic symptoms: Principal | ICD-10-CM

## 2018-05-23 DIAGNOSIS — F1094 Alcohol use, unspecified with alcohol-induced mood disorder: Secondary | ICD-10-CM | POA: Diagnosis not present

## 2018-05-23 DIAGNOSIS — F1494 Cocaine use, unspecified with cocaine-induced mood disorder: Secondary | ICD-10-CM | POA: Diagnosis not present

## 2018-05-23 DIAGNOSIS — R440 Auditory hallucinations: Secondary | ICD-10-CM | POA: Diagnosis not present

## 2018-05-23 MED ORDER — BENZTROPINE MESYLATE 1 MG PO TABS: 1.0000 mg | ORAL_TABLET | Freq: Two times a day (BID) | ORAL | Status: DC

## 2018-05-23 MED ORDER — TRAZODONE HCL 50 MG PO TABS: 50.0000 mg | ORAL_TABLET | Freq: Every evening | ORAL | 0 refills | Status: DC | PRN

## 2018-05-23 MED ORDER — HALOPERIDOL 5 MG PO TABS: 5.0000 mg | ORAL_TABLET | Freq: Every morning | ORAL | 0 refills | Status: DC

## 2018-05-23 MED ORDER — BENZTROPINE MESYLATE 1 MG PO TABS: 1.0000 mg | ORAL_TABLET | Freq: Two times a day (BID) | ORAL | 0 refills | Status: DC

## 2018-05-23 MED ORDER — HYDROXYZINE HCL 50 MG PO TABS: 50.0000 mg | ORAL_TABLET | Freq: Four times a day (QID) | ORAL | 0 refills | Status: DC | PRN

## 2018-05-23 MED ORDER — HALOPERIDOL 10 MG PO TABS: 10.0000 mg | ORAL_TABLET | Freq: Every day | ORAL | 0 refills | Status: DC

## 2018-05-23 MED ORDER — SERTRALINE HCL 50 MG PO TABS: 50.0000 mg | ORAL_TABLET | Freq: Every day | ORAL | 0 refills | Status: DC

## 2018-05-23 MED ORDER — HALOPERIDOL 5 MG PO TABS: 10.0000 mg | ORAL_TABLET | Freq: Every day | ORAL | Status: DC

## 2018-05-23 MED ORDER — HALOPERIDOL 5 MG PO TABS: 5.0000 mg | ORAL_TABLET | Freq: Every morning | ORAL | Status: DC

## 2018-05-23 NOTE — BH Assessment (Signed)
[facility] Assessment Progress Note  Per Juanetta Beets, DO, this pt does not require psychiatric hospitalization at this time.  Pt is to be discharged from the [facility] Observation Unit with recommendation to continue treatment with [facility].  Pt's discharge instructions advise him accordingly.  Pt's nurse, Christen Bame, has been notified.  Doylene Canning, MA Triage Specialist [phone]

## 2018-05-23 NOTE — Discharge Instructions (Signed)
For your mental health needs, you are advised to continue treatment with [facility]: ° °     [facility] °     [address] °     [place], [place] [address] °     [phone] °     Crisis number: [phone] °

## 2018-05-23 NOTE — Discharge Summary (Signed)
Pt was seen and evaluated by Dr Sharma Covert and the treatment team today. He was calm and cooperative. He presented to [facility] due to auditory hallucinations induced by not taking his antipsychotic medications for one month and using cocaine and alcohol. He stated he could not get his medication because he believed the pharmacy was closed. He was educated that the pharmacy remains open.  Pt lives with his mother and is able to contract for safety. He was provided prescriptions at discharge, which he refused to take with him. Pt was deemed psychiatrically clear, his IVC was rescinded and he was discharged.  Laveda Abbe, FNP-C 05/23/2018       1430

## 2018-05-23 NOTE — Progress Notes (Signed)
Patient ID: Edwin Henry, male   DOB: [birth year], 58 y.o.   MRN: [id number] Patient discharged to home/self care on his own accord.  Patient denies SI, HI and AVH upon discharge.
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
## 2018-08-12 ENCOUNTER — Inpatient Hospital Stay (HOSPITAL_COMMUNITY)
Admission: RE | Admit: 2018-08-12 | Discharge: 2018-08-21 | DRG: 885 | Disposition: A | Discharge status: Home / Self Care | Payer: Medicaid Other | Attending: Psychiatry | Admitting: Psychiatry

## 2018-08-12 ENCOUNTER — Other Ambulatory Visit: Payer: Self-pay

## 2018-08-12 ENCOUNTER — Encounter (HOSPITAL_COMMUNITY): Payer: Self-pay | Admitting: Rehabilitation

## 2018-08-12 DIAGNOSIS — F1021 Alcohol dependence, in remission: Secondary | ICD-10-CM

## 2018-08-12 DIAGNOSIS — F333 Major depressive disorder, recurrent, severe with psychotic symptoms: Principal | ICD-10-CM | POA: Diagnosis present

## 2018-08-12 DIAGNOSIS — Z915 Personal history of self-harm: Secondary | ICD-10-CM

## 2018-08-12 DIAGNOSIS — F1490 Cocaine use, unspecified, uncomplicated: Secondary | ICD-10-CM | POA: Diagnosis present

## 2018-08-12 DIAGNOSIS — F1290 Cannabis use, unspecified, uncomplicated: Secondary | ICD-10-CM | POA: Diagnosis present

## 2018-08-12 DIAGNOSIS — Z62810 Personal history of physical and sexual abuse in childhood: Secondary | ICD-10-CM | POA: Diagnosis present

## 2018-08-12 DIAGNOSIS — R45851 Suicidal ideations: Secondary | ICD-10-CM | POA: Diagnosis present

## 2018-08-12 DIAGNOSIS — Z590 Homelessness: Secondary | ICD-10-CM

## 2018-08-12 DIAGNOSIS — F1020 Alcohol dependence, uncomplicated: Secondary | ICD-10-CM | POA: Diagnosis present

## 2018-08-12 DIAGNOSIS — F250 Schizoaffective disorder, bipolar type: Secondary | ICD-10-CM

## 2018-08-12 DIAGNOSIS — F17210 Nicotine dependence, cigarettes, uncomplicated: Secondary | ICD-10-CM | POA: Diagnosis present

## 2018-08-12 DIAGNOSIS — Z1159 Encounter for screening for other viral diseases: Secondary | ICD-10-CM

## 2018-08-12 MED ORDER — TRAZODONE HCL 50 MG PO TABS
50.0000 mg | ORAL_TABLET | Freq: Every evening | ORAL | Status: DC | PRN
  Administered 2018-08-12 – 2018-08-13 (×3): 50 mg via ORAL
  Filled 2018-08-12 (×9): qty 1

## 2018-08-12 MED ORDER — MAGNESIUM HYDROXIDE 400 MG/5ML PO SUSP: 30.0000 mL | Freq: Every day | ORAL | Status: DC | PRN

## 2018-08-12 MED ORDER — VITAMIN B-1 100 MG PO TABS
100.0000 mg | ORAL_TABLET | Freq: Every day | ORAL | Status: DC
  Administered 2018-08-13: 100 mg via ORAL
  Filled 2018-08-12 (×3): qty 1

## 2018-08-12 MED ORDER — TRAZODONE HCL 50 MG PO TABS
50.0000 mg | ORAL_TABLET | Freq: Every evening | ORAL | Status: DC | PRN
  Administered 2018-08-12: 50 mg via ORAL
  Filled 2018-08-12: qty 1

## 2018-08-12 MED ORDER — HYDROXYZINE HCL 25 MG PO TABS
25.0000 mg | ORAL_TABLET | Freq: Four times a day (QID) | ORAL | Status: DC | PRN
  Administered 2018-08-13 – 2018-08-14 (×3): 25 mg via ORAL
  Filled 2018-08-12 (×3): qty 1

## 2018-08-12 MED ORDER — ONDANSETRON 4 MG PO TBDP: 4.0000 mg | ORAL_TABLET | Freq: Four times a day (QID) | ORAL | Status: DC | PRN

## 2018-08-12 MED ORDER — ACETAMINOPHEN 325 MG PO TABS
650.0000 mg | ORAL_TABLET | Freq: Four times a day (QID) | ORAL | Status: DC | PRN
  Administered 2018-08-12 – 2018-08-17 (×2): 650 mg via ORAL
  Filled 2018-08-12 (×2): qty 2

## 2018-08-12 MED ORDER — ALUM & MAG HYDROXIDE-SIMETH 200-200-20 MG/5ML PO SUSP: 30.0000 mL | ORAL | Status: DC | PRN

## 2018-08-12 MED ORDER — CHLORDIAZEPOXIDE HCL 25 MG PO CAPS: 25.0000 mg | ORAL_CAPSULE | Freq: Four times a day (QID) | ORAL | Status: DC | PRN

## 2018-08-12 MED ORDER — LOPERAMIDE HCL 2 MG PO CAPS: 2.0000 mg | ORAL_CAPSULE | ORAL | Status: AC | PRN

## 2018-08-12 MED ORDER — THIAMINE HCL 100 MG/ML IJ SOLN
100.0000 mg | Freq: Once | INTRAMUSCULAR | Status: AC
  Administered 2018-08-12: 100 mg via INTRAMUSCULAR
  Filled 2018-08-12: qty 2

## 2018-08-12 MED ORDER — HYDROXYZINE HCL 25 MG PO TABS: 25.0000 mg | ORAL_TABLET | Freq: Three times a day (TID) | ORAL | Status: DC | PRN

## 2018-08-12 MED ORDER — ADULT MULTIVITAMIN W/MINERALS CH
1.0000 | ORAL_TABLET | Freq: Every day | ORAL | Status: DC
  Administered 2018-08-12 – 2018-08-21 (×10): 1 via ORAL
  Filled 2018-08-12 (×14): qty 1

## 2018-08-12 NOTE — Progress Notes (Addendum)
Edwin Henry is a 58 year old male admitted due to increased suicidal ideation, hearing voices telling him to walk into traffic.  He states that he has been hearing voices for "a very long" time, but hey have increased in the last couple of months.  He reports that he is feeling suicidal and has had a plan to walk into traffic.  He reports that his medications are not working and that he is unable to eat or sleep.  He is passively suicidal and tearful at times, but does contract for safety on the unit.  He lives with his mother and is seen at [facility].  He was hospitalized in the past and felt that his medications were working at that time.    [place] NOVEL CORONAVIRUS (COVID-19) DAILY CHECK-OFF SYMPTOMS - answer yes or no to each - every day NO YES  Have you had a fever in the past 24 hours?  . Fever (Temp > 37.80C / 100F) X   Have you had any of these symptoms in the past 24 hours? . New Cough .  Sore Throat  .  Shortness of Breath .  Difficulty Breathing .  Unexplained Body Aches   X   Have you had any one of these symptoms in the past 24 hours not related to allergies?   . Runny Nose .  Nasal Congestion .  Sneezing   X   If you have had runny nose, nasal congestion, sneezing in the past 24 hours, has it worsened?  X   EXPOSURES - check yes or no X   Have you traveled outside the state in the past 14 days?  X   Have you been in contact with someone with a confirmed diagnosis of COVID-19 or PUI in the past 14 days without wearing appropriate PPE?  X   Have you been living in the same home as a person with confirmed diagnosis of COVID-19 or a PUI (household contact)?    X   Have you been diagnosed with COVID-19?    X              What to do next: Answered NO to all: Answered YES to anything:   Proceed with unit schedule Follow the [facility] Inpatient Flowsheet.

## 2018-08-12 NOTE — H&P (Signed)
Behavioral Health Medical Screening Exam  Edwin Henry is an 58 y.o. male. Patient present with [employer] voluntary. Reporting " the voice are telling me to kill myself." reported history of substance abuse use. Reported drinking alcohol  early today. States history with cocaine use, however patient is currently denying.  Patient reported previous inpatient admission with a hospitalization about 6 months prior at [facility]. Patient reported that he followed by [facility] however hasn't been able to follow-up do to COVID.  Patient reported his medication are not working. Support, encouragement and reassurance was provided.   Total Time spent with patient: 15 minutes  Psychiatric Specialty Exam: Physical Exam  Nursing note and vitals reviewed. Psychiatric: He has a normal mood and affect. His behavior is normal.    Review of Systems  Psychiatric/Behavioral: Positive for depression, substance abuse and suicidal ideas.  All other systems reviewed and are negative.   Blood pressure (!) 123/113, pulse (!) 108, temperature 99 F (37.2 C), temperature source Oral, resp. rate 18, SpO2 100 %.There is no height or weight on file to calculate BMI.  General Appearance: Guarded  Eye Contact:  Fair  Speech:  Clear and Coherent  Volume:  Normal  Mood:  Anxious and Depressed  Affect:  Congruent  Thought Process:  Coherent  Orientation:  Full (Time, Place, and Person)  Thought Content:  Logical and Hallucinations: Auditory  Suicidal Thoughts:  Yes.  with intent/plan  Homicidal Thoughts:  No  Memory:  Immediate;   Fair Recent;   Fair  Judgement:  Fair  Insight:  Fair  Psychomotor Activity:  Normal  Concentration: Concentration: Fair  Recall:  [employer] of Knowledge:Fair  Language: Fair  Akathisia:  No  Handed:  Right  AIMS (if indicated):     Assets:  Communication Skills Desire for Improvement Resilience Social Support  Sleep:       Musculoskeletal: Strength & Muscle Tone: within normal  limits Gait & Station: normal Patient leans: N/A  Blood pressure (!) 123/113, pulse (!) 108, temperature 99 F (37.2 C), temperature source Oral, resp. rate 18, SpO2 100 %.  Recommendations: Oberservation  Medication was restarted where appropriate  Based on my evaluation the patient does not appear to have an emergency medical condition.  Derrill Center, NP 08/12/2018, 6:30 PM
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
## 2018-08-13 ENCOUNTER — Encounter (HOSPITAL_COMMUNITY): Payer: Self-pay | Admitting: Registered Nurse

## 2018-08-13 DIAGNOSIS — Z62810 Personal history of physical and sexual abuse in childhood: Secondary | ICD-10-CM | POA: Diagnosis present

## 2018-08-13 DIAGNOSIS — F1020 Alcohol dependence, uncomplicated: Secondary | ICD-10-CM | POA: Diagnosis present

## 2018-08-13 DIAGNOSIS — F1290 Cannabis use, unspecified, uncomplicated: Secondary | ICD-10-CM | POA: Diagnosis present

## 2018-08-13 DIAGNOSIS — F333 Major depressive disorder, recurrent, severe with psychotic symptoms: Secondary | ICD-10-CM | POA: Diagnosis present

## 2018-08-13 DIAGNOSIS — Z590 Homelessness: Secondary | ICD-10-CM | POA: Diagnosis not present

## 2018-08-13 DIAGNOSIS — F250 Schizoaffective disorder, bipolar type: Secondary | ICD-10-CM | POA: Diagnosis not present

## 2018-08-13 DIAGNOSIS — R45851 Suicidal ideations: Secondary | ICD-10-CM | POA: Diagnosis present

## 2018-08-13 DIAGNOSIS — F17210 Nicotine dependence, cigarettes, uncomplicated: Secondary | ICD-10-CM | POA: Diagnosis present

## 2018-08-13 DIAGNOSIS — F1490 Cocaine use, unspecified, uncomplicated: Secondary | ICD-10-CM | POA: Diagnosis present

## 2018-08-13 DIAGNOSIS — Z1159 Encounter for screening for other viral diseases: Secondary | ICD-10-CM | POA: Diagnosis not present

## 2018-08-13 DIAGNOSIS — F1021 Alcohol dependence, in remission: Secondary | ICD-10-CM

## 2018-08-13 DIAGNOSIS — Z915 Personal history of self-harm: Secondary | ICD-10-CM | POA: Diagnosis not present

## 2018-08-13 LAB — CBC
HCT: 53.7 % — ABNORMAL HIGH (ref 39.0–52.0)
Hemoglobin: 17.4 g/dL — ABNORMAL HIGH (ref 13.0–17.0)
MCH: 30.6 pg (ref 26.0–34.0)
MCHC: 32.4 g/dL (ref 30.0–36.0)
MCV: 94.5 fL (ref 80.0–100.0)
Platelets: 338 10*3/uL (ref 150–400)
RBC: 5.68 MIL/uL (ref 4.22–5.81)
RDW: 14.6 % (ref 11.5–15.5)
WBC: 7.3 10*3/uL (ref 4.0–10.5)
nRBC: 0 % (ref 0.0–0.2)

## 2018-08-13 LAB — COMPREHENSIVE METABOLIC PANEL
ALT: 27 U/L (ref 0–44)
AST: 22 U/L (ref 15–41)
Albumin: 4 g/dL (ref 3.5–5.0)
Alkaline Phosphatase: 72 U/L (ref 38–126)
Anion gap: 8 (ref 5–15)
BUN: 14 mg/dL (ref 6–20)
CO2: 29 mmol/L (ref 22–32)
Calcium: 9.4 mg/dL (ref 8.9–10.3)
Chloride: 102 mmol/L (ref 98–111)
Creatinine, Ser: 0.88 mg/dL (ref 0.61–1.24)
GFR calc Af Amer: >60 mL/min (ref >60)
GFR calc non Af Amer: >60 mL/min (ref >60)
Glucose, Bld: 91 mg/dL (ref 70–99)
Potassium: 4.2 mmol/L (ref 3.5–5.1)
Sodium: 139 mmol/L (ref 135–145)
Total Bilirubin: 0.6 mg/dL (ref 0.3–1.2)
Total Protein: 7.7 g/dL (ref 6.5–8.1)

## 2018-08-13 LAB — ETHANOL: Alcohol, Ethyl (B): <10 mg/dL (ref <10)

## 2018-08-13 LAB — RAPID URINE DRUG SCREEN, HOSP PERFORMED
Amphetamines: NOT DETECTED
Barbiturates: NOT DETECTED
Benzodiazepines: NOT DETECTED
Cocaine: POSITIVE — AB
Opiates: NOT DETECTED
Tetrahydrocannabinol: POSITIVE — AB

## 2018-08-13 LAB — SARS CORONAVIRUS 2 BY RT PCR (HOSPITAL ORDER, PERFORMED IN ~~LOC~~ HOSPITAL LAB): SARS Coronavirus 2: NEGATIVE

## 2018-08-13 LAB — LIPID PANEL
Cholesterol: 235 mg/dL — ABNORMAL HIGH (ref 0–200)
HDL: 41 mg/dL (ref >40)
LDL Cholesterol: 152 mg/dL — ABNORMAL HIGH (ref 0–99)
Total CHOL/HDL Ratio: 5.7 RATIO
Triglycerides: 210 mg/dL — ABNORMAL HIGH (ref <150)
VLDL: 42 mg/dL — ABNORMAL HIGH (ref 0–40)

## 2018-08-13 LAB — HEMOGLOBIN A1C
Hgb A1c MFr Bld: 5.4 % (ref 4.8–5.6)
Mean Plasma Glucose: 108.28 mg/dL

## 2018-08-13 LAB — TSH: TSH: 0.989 u[IU]/mL (ref 0.350–4.500)

## 2018-08-13 MED ORDER — FLUOXETINE HCL 20 MG PO CAPS
20.0000 mg | ORAL_CAPSULE | Freq: Every day | ORAL | Status: DC
  Administered 2018-08-13 – 2018-08-14 (×2): 20 mg via ORAL
  Filled 2018-08-13 (×5): qty 1

## 2018-08-13 MED ORDER — HALOPERIDOL 5 MG PO TABS
5.0000 mg | ORAL_TABLET | Freq: Every day | ORAL | Status: DC
  Administered 2018-08-13 – 2018-08-15 (×3): 5 mg via ORAL
  Filled 2018-08-13 (×8): qty 1

## 2018-08-13 MED ORDER — BENZTROPINE MESYLATE 1 MG PO TABS
1.0000 mg | ORAL_TABLET | Freq: Two times a day (BID) | ORAL | Status: DC
  Administered 2018-08-13 – 2018-08-21 (×17): 1 mg via ORAL
  Filled 2018-08-13 (×4): qty 1
  Filled 2018-08-13: qty 14
  Filled 2018-08-13 (×16): qty 1
  Filled 2018-08-13: qty 14
  Filled 2018-08-13: qty 1

## 2018-08-13 MED ORDER — HALOPERIDOL 5 MG PO TABS
10.0000 mg | ORAL_TABLET | Freq: Every day | ORAL | Status: DC
  Administered 2018-08-13: 10 mg via ORAL
  Filled 2018-08-13 (×3): qty 2

## 2018-08-13 NOTE — Progress Notes (Signed)
Shift Note : Pt c/o hearing voices today usually a man's voice telling him to hurt himself. " I don't want to hurt myself but my medication isn't working. " Pt reports seeing demons that come and go. Pt has been pleasant sitting in the dayroom . Admits to drinking sometimes to help with the voices. "  I drink those miniature  Bottles." Pt took Haldol and cogentin as schedule. Pt has been cooperative.and pleasant.

## 2018-08-13 NOTE — Progress Notes (Signed)
[place] NOVEL CORONAVIRUS (COVID-19) DAILY CHECK-OFF SYMPTOMS - answer yes or no to each - every day NO YES  Have you had a fever in the past 24 hours?  . Fever (Temp > 37.80C / 100F) X   Have you had any of these symptoms in the past 24 hours? . New Cough .  Sore Throat  .  Shortness of Breath .  Difficulty Breathing .  Unexplained Body Aches   X   Have you had any one of these symptoms in the past 24 hours not related to allergies?   . Runny Nose .  Nasal Congestion .  Sneezing   X   If you have had runny nose, nasal congestion, sneezing in the past 24 hours, has it worsened?  X   EXPOSURES - check yes or no X   Have you traveled outside the state in the past 14 days?  X   Have you been in contact with someone with a confirmed diagnosis of COVID-19 or PUI in the past 14 days without wearing appropriate PPE?  X   Have you been living in the same home as a person with confirmed diagnosis of COVID-19 or a PUI (household contact)?    X   Have you been diagnosed with COVID-19?    X              What to do next: Answered NO to all: Answered YES to anything:   Proceed with unit schedule Follow the [facility] Inpatient Flowsheet.   

## 2018-08-13 NOTE — BH Assessment (Signed)
[facility] Assessment Progress Note  Per Buford Dresser, DO, this voluntary pt requires psychiatric hospitalization at this time.  The following facilities have been contacted to seek placement for this pt, with results as noted:  Beds available, information sent, decision pending:  [place]:  [facility] (no appropriate bed)   Jalene Mullet, [place] Coordinator [phone]

## 2018-08-13 NOTE — Progress Notes (Addendum)
[facility]  08/13/2018 3:44 PM Edwin Henry Bardsley  MRN:  [id number]   Subjective:   Edwin Henry Kemp, 58 y.o., male patient [facility] as a walk in via police with complaints of auditory/visual hallucinations and suicidal ideation with plan to overdose .  Patient seen face to face by this provider, Dr. Sharma CovertNorman; and  chart reviewed on 08/13/18.  On evaluation Edwin Henry Kant reports the he continues to have suicidal thoughts with plan to overdose.  Patient also states that depression has worsened over the last several months with the voices telling him to kill himself getting worse.  Patient states that he is also seeing shadows.  Patient states that he has a problem with alcoholism and recently he has used "weed and cocaine" (3-4 days ago).  Patient states that he does have a prior history of suicide attempt "several time overdose and once cut my wrist almost off had to get staples."  Patient states that he has outpatient services through the [facility] but has not seen anyone in 8 months related to the Covid virus.  States he has had telephone visits but doesn't feel it has helped.  Patient states that he is unable to contract for safety.  Patient does deny homicidal ideation and paranoia.   During evaluation Edwin Henry Grillo is sitting on side of his bed; he is alert/oriented x 4; calm/cooperative; and mood congruent with affect.  Patient is speaking in a clear tone at moderate volume, and normal pace; with good eye contact.  His thought process is coherent and relevant; but continues to endorse auditory and visual hallucinations.  Patient also continues to endorse suicidal ideation with plan to overdose.  Patient denies suicidal/self-harm/homicidal ideation, psychosis, and paranoia.  Patient has remained calm throughout assessment and has answered questions appropriately.   Patient also states that he is interested in starting a long acting injectable psychotropic since he often forgets to take his  medication as order and at time not having it available.    Principal Problem: MDD (major depressive disorder), recurrent, severe, with psychosis (HCC) Diagnosis: Principal Problem:   MDD (major depressive disorder), recurrent, severe, with psychosis (HCC) Active Problems:   Alcohol use disorder, moderate, dependence (HCC)  Total Time spent with patient: 30 minutes  Past Psychiatric History: Schizophrenia, alcohol abuse, substance abuse Past Medical History:  Past Medical History:  Diagnosis Date  . Schizophrenia [facility](HCC)     Past Surgical History:  Procedure Laterality Date  . KNEE SURGERY     Family History: History reviewed. No pertinent family history. Family Psychiatric  History: Denies  Social History:  Social History   Substance and Sexual Activity  Alcohol Use Yes   Comment: 1-2 beers every 2 days     Social History   Substance and Sexual Activity  Drug Use Yes  . Types: Marijuana, Cocaine   Comment: occasional THC use    Social History   Socioeconomic History  . Marital status: Single    Spouse name: Not on file  . Number of children: Not on file  . Years of education: Not on file  . Highest education level: Not on file  Occupational History  . Not on file  Social Needs  . Financial resource strain: Not on file  . Food insecurity    Worry: Not on file    Inability: Not on file  . Transportation needs    Medical: Not on file    Non-medical: Not on file  Tobacco Use  .  Smoking status: Current Every Day Smoker    Packs/day: 0.50  . Smokeless tobacco: Current User    Types: Chew  Substance and Sexual Activity  . Alcohol use: Yes    Comment: 1-2 beers every 2 days  . Drug use: Yes    Types: Marijuana, Cocaine    Comment: occasional THC use  . Sexual activity: Yes    Birth control/protection: Condom  Lifestyle  . Physical activity    Days per week: Not on file    Minutes per session: Not on file  . Stress: Not on file  Relationships  . Social  Musicianconnections    Talks on phone: Not on file    Gets together: Not on file    Attends religious service: Not on file    Active member of club or organization: Not on file    Attends meetings of clubs or organizations: Not on file    Relationship status: Not on file  Other Topics Concern  . Not on file  Social History Narrative  . Not on file   Additional Social History:    Pain Medications: see MAR Prescriptions: see MAR Over the Counter: see MAR History of alcohol / drug use?: Yes Longest period of sobriety (when/how long): none reported Negative Consequences of Use: Personal relationships, Work / Programmer, multimediachool, Surveyor, quantityinancial Name of Substance 1: alcohol 1 - Age of First Use: 18 1 - Amount (size/oz): quart of beer 1 - Frequency: weekly 1 - Duration: since onset 1 - Last Use / Amount: today Name of Substance 2: cocaine 2 - Age of First Use: not assessed 2 - Amount (size/oz): $40 2 - Frequency: occasional 2 - Duration: unknown 2 - Last Use / Amount: few days ago Name of Substance 3: Marijuana 3 - Age of First Use: not assessed 3 - Amount (size/oz): 1 gram 3 - Frequency: occasionally 3 - Duration: unknown 3 - Last Use / Amount: patient states that it has been awhile              Sleep: Fair  Appetite:  Fair  Current Medications: Current Facility-Administered Medications  Medication Dose Route Frequency Provider Last Rate Last Dose  . acetaminophen (TYLENOL) tablet 650 mg  650 mg Oral Q6H PRN Oneta RackLewis, Tanika N, NP   650 mg at 08/12/18 2041  . alum & mag hydroxide-simeth (MAALOX/MYLANTA) 200-200-20 MG/5ML suspension 30 mL  30 mL Oral Q4H PRN Oneta RackLewis, Tanika N, NP      . benztropine (COGENTIN) tablet 1 mg  1 mg Oral BID Juanetta BeetsNorman, Farrell Broerman J, DO   1 mg at 08/13/18 1201  . chlordiazePOXIDE (LIBRIUM) capsule 25 mg  25 mg Oral Q6H PRN Oneta RackLewis, Tanika N, NP      . FLUoxetine (PROZAC) capsule 20 mg  20 mg Oral Daily Cherly Beachorman, Daizee Firmin J, DO   20 mg at 08/13/18 1201  . haloperidol (HALDOL)  tablet 10 mg  10 mg Oral QHS Cherly BeachNorman, Jacquise Rarick J, DO      . haloperidol (HALDOL) tablet 5 mg  5 mg Oral Daily Cherly Beachorman, Nicanor Mendolia J, DO   5 mg at 08/13/18 1201  . hydrOXYzine (ATARAX/VISTARIL) tablet 25 mg  25 mg Oral Q6H PRN Oneta RackLewis, Tanika N, NP   25 mg at 08/13/18 0930  . loperamide (IMODIUM) capsule 2-4 mg  2-4 mg Oral PRN Oneta RackLewis, Tanika N, NP      . magnesium hydroxide (MILK OF MAGNESIA) suspension 30 mL  30 mL Oral Daily PRN Oneta RackLewis, Tanika N, NP      .  multivitamin with minerals tablet 1 tablet  1 tablet Oral Daily Derrill Center, NP   1 tablet at 08/13/18 0930  . ondansetron (ZOFRAN-ODT) disintegrating tablet 4 mg  4 mg Oral Q6H PRN Derrill Center, NP      . thiamine (VITAMIN B-1) tablet 100 mg  100 mg Oral Daily Derrill Center, NP   100 mg at 08/13/18 0931  . traZODone (DESYREL) tablet 50 mg  50 mg Oral QHS,MR X 1 Lindon Romp A, NP   50 mg at 08/12/18 2138    Lab Results:  Results for orders placed or performed during the hospital encounter of 08/12/18 (from the past 48 hour(s))  TSH     Status: None   Collection Time: 08/13/18  6:51 AM  Result Value Ref Range   TSH 0.989 0.350 - 4.500 uIU/mL    Comment: Performed by a 3rd Generation assay with a functional sensitivity of <=0.01 uIU/mL. Performed at [facility], [place] [address], [place], [place] [address]   Hemoglobin A1c     Status: None   Collection Time: 08/13/18  6:51 AM  Result Value Ref Range   Hgb A1c MFr Bld 5.4 4.8 - 5.6 %    Comment: (NOTE) Pre diabetes:          5.7%-6.4% Diabetes:              >6.4% Glycemic control for   <7.0% adults with diabetes    Mean Plasma Glucose 108.28 mg/dL    Comment: Performed at [facility] [address] [place], [place] [address]  Ethanol     Status: None   Collection Time: 08/13/18  6:51 AM  Result Value Ref Range   Alcohol, Ethyl (B) <10 <10 mg/dL    Comment: (NOTE) Lowest detectable limit for serum alcohol is 10 mg/dL. For medical purposes  only. Performed at [facility], [place] [address], [place], [place] [address]   Lipid panel     Status: Abnormal   Collection Time: 08/13/18  6:51 AM  Result Value Ref Range   Cholesterol 235 (H) 0 - 200 mg/dL   Triglycerides 210 (H) <150 mg/dL   HDL 41 >40 mg/dL   Total CHOL/HDL Ratio 5.7 RATIO   VLDL 42 (H) 0 - 40 mg/dL   LDL Cholesterol 152 (H) 0 - 99 mg/dL    Comment:        Total Cholesterol/HDL:CHD Risk Coronary Heart Disease Risk Table                     Men   Women  1/2 Average Risk   3.4   3.3  Average Risk       5.0   4.4  2 X Average Risk   9.6   7.1  3 X Average Risk  23.4   11.0        Use the calculated Patient Ratio above and the CHD Risk Table to determine the patient's CHD Risk.        ATP III CLASSIFICATION (LDL):  <100     mg/dL   Optimal  100-129  mg/dL   Near or Above                    Optimal  130-159  mg/dL   Borderline  160-189  mg/dL   High  >190     mg/dL   Very High Performed at [facility], [address] [place], [place] [address]   CBC     Status: Abnormal   Collection Time: 08/13/18  6:51 AM  Result Value Ref Range   WBC 7.3 4.0 - 10.5 K/uL   RBC 5.68 4.22 - 5.81 MIL/uL   Hemoglobin 17.4 (H) 13.0 - 17.0 g/dL   HCT 60.4 (H) 54.0 - 98.1 %   MCV 94.5 80.0 - 100.0 fL   MCH 30.6 26.0 - 34.0 pg   MCHC 32.4 30.0 - 36.0 g/dL   RDW 19.1 47.8 - 29.5 %   Platelets 338 150 - 400 K/uL   nRBC 0.0 0.0 - 0.2 %    Comment: Performed at [facility], [address], [place], [place] [address]  Comprehensive metabolic panel     Status: None   Collection Time: 08/13/18  6:51 AM  Result Value Ref Range   Sodium 139 135 - 145 mmol/L   Potassium 4.2 3.5 - 5.1 mmol/L   Chloride 102 98 - 111 mmol/L   CO2 29 22 - 32 mmol/L   Glucose, Bld 91 70 - 99 mg/dL   BUN 14 6 - 20 mg/dL   Creatinine, Ser 8.65 0.61 - 1.24 mg/dL   Calcium 9.4 8.9 - 78.4 mg/dL   Total Protein 7.7 6.5 - 8.1 g/dL   Albumin  4.0 3.5 - 5.0 g/dL   AST 22 15 - 41 U/L   ALT 27 0 - 44 U/L   Alkaline Phosphatase 72 38 - 126 U/L   Total Bilirubin 0.6 0.3 - 1.2 mg/dL   GFR calc non Af Amer >60 >60 mL/min   GFR calc Af Amer >60 >60 mL/min   Anion gap 8 5 - 15    Comment: Performed at [facility], [address], [place], [place] [address]  Urine rapid drug screen (hosp performed)not at [facility]     Status: Abnormal   Collection Time: 08/13/18  7:07 AM  Result Value Ref Range   Opiates NONE DETECTED NONE DETECTED   Cocaine POSITIVE (A) NONE DETECTED   Benzodiazepines NONE DETECTED NONE DETECTED   Amphetamines NONE DETECTED NONE DETECTED   Tetrahydrocannabinol POSITIVE (A) NONE DETECTED   Barbiturates NONE DETECTED NONE DETECTED    Comment: (NOTE) DRUG SCREEN FOR MEDICAL PURPOSES ONLY.  IF CONFIRMATION IS NEEDED FOR ANY PURPOSE, NOTIFY LAB WITHIN 5 DAYS. LOWEST DETECTABLE LIMITS FOR URINE DRUG SCREEN Drug Class                     Cutoff (ng/mL) Amphetamine and metabolites    1000 Barbiturate and metabolites    200 Benzodiazepine                 200 Tricyclics and metabolites     300 Opiates and metabolites        300 Cocaine and metabolites        300 THC                            50 Performed at [facility], [address] [place], [place] [address]     Blood Alcohol level:  Lab Results  Component Value Date   ETH <10 08/13/2018   ETH 163 (H) 05/22/2018    Metabolic Disorder Labs: Lab Results  Component Value Date   HGBA1C 5.4 08/13/2018   MPG 108.28 08/13/2018   MPG 111.15 10/11/2017   Lab Results  Component Value  Date   PROLACTIN 22.1 (H) 10/11/2017   Lab Results  Component Value Date   CHOL 235 (H) 08/13/2018   TRIG 210 (H) 08/13/2018   HDL 41 08/13/2018   CHOLHDL 5.7 08/13/2018   VLDL 42 (H) 08/13/2018   LDLCALC 152 (H) 08/13/2018   LDLCALC UNABLE TO CALCULATE IF TRIGLYCERIDE OVER 400 mg/dL 16/10/960409/06/2017    Physical Findings: AIMS:  , ,  ,   ,    CIWA:  CIWA-Ar Total: 6 COWS:   N/A  Musculoskeletal: Strength & Muscle Tone: within normal limits Gait & Station: normal Patient leans: N/A  Psychiatric Specialty Exam: Physical Exam  Nursing note and vitals reviewed. Constitutional: He is oriented to person, place, and time. He appears well-developed and well-nourished. No distress.  Neck: Normal range of motion.  Respiratory: Effort normal.  Musculoskeletal: Normal range of motion.  Neurological: He is alert and oriented to person, place, and time.  Skin: Skin is warm and dry.  Psychiatric: His speech is normal. His mood appears anxious. He is actively hallucinating. Cognition and memory are normal. He expresses impulsivity. He exhibits a depressed mood. He expresses suicidal ideation. He expresses suicidal plans.    Review of Systems  Psychiatric/Behavioral: Positive for depression, hallucinations, substance abuse and suicidal ideas. The patient is nervous/anxious.   All other systems reviewed and are negative.   Blood pressure 90/68, pulse (!) 113, temperature 98.1 F (36.7 C), temperature source Oral, resp. rate 18, SpO2 100 %.There is no height or weight on file to calculate BMI.  General Appearance: Casual  Eye Contact:  Fair  Speech:  Clear and Coherent and Normal Rate  Volume:  Normal  Mood:  Depressed and Hopeless  Affect:  Congruent and Depressed  Thought Process:  Coherent, Goal Directed and Descriptions of Associations: Intact  Orientation:  Full (Time, Place, and Person)  Thought Content:  Hallucinations: Auditory Command:  Voices telling him to kill himself Visual  Suicidal Thoughts:  Yes.  with intent/plan  Homicidal Thoughts:  No  Memory:  Immediate;   Fair Recent;   Fair Remote;   Fair  Judgement:  Fair  Insight:  Fair  Psychomotor Activity:  Normal  Concentration:  Concentration: Fair and Attention Span: Fair  Recall:  FiservFair  Fund of Knowledge:  Fair  Language:  Good  Akathisia:  No  Handed:   Right  AIMS (if indicated):   N/A  Assets:  Communication Skills Desire for Improvement Housing  ADL's:  Intact  Cognition:  WNL  Sleep:   N/A   Treatment Plan Summary: Daily contact with patient to assess and evaluate symptoms and progress in treatment, Medication management and Plan Inpatient psychiatric treatment Patients medications started Cogentin 1 mg bid for EPS prophylaxis.  Prozac 20 mg daily for depression. Librium detox protocol for alcohol withdrawal Haldol 5 mg daily; and 10 mg at bed time Psychosis Vistaril 25 mg Q 6 hr prn for anxiety Trazodone 50 mg Q hs prn for insomnia  Disposition:  Inpatient psychiatric treatment   Shuvon Rankin, NP 08/13/2018, 3:44 PM   Patient seen face-to-face for psychiatric evaluation, chart reviewed and case discussed with the physician extender and developed treatment plan. Reviewed the information documented and agree with the treatment plan.  Juanetta BeetsJacqueline Sherma Vanmetre, DO 08/13/18 7:45 PM

## 2018-08-13 NOTE — Progress Notes (Signed)
Pt transferred to 300 hall by nurse and mht, belongings sent to adult unit, no issues.

## 2018-08-13 NOTE — Discharge Summary (Addendum)
  Patient accepted to [facility] for inpatient psychiatric treatment  Shuvon B. Rankin, NP  Patient seen face-to-face for psychiatric evaluation, chart reviewed and case discussed with the physician extender and developed treatment plan. Reviewed the information documented and agree with the treatment plan.  Buford Dresser, DO 08/13/18 7:30 PM

## 2018-08-13 NOTE — Progress Notes (Signed)
[place] NOVEL CORONAVIRUS (COVID-19) DAILY CHECK-OFF SYMPTOMS - answer yes or no to each - every day NO YES  Have you had a fever in the past 24 hours?  . Fever (Temp > 37.80C / 100F) X   Have you had any of these symptoms in the past 24 hours? . New Cough .  Sore Throat  .  Shortness of Breath .  Difficulty Breathing .  Unexplained Body Aches   X   Have you had any one of these symptoms in the past 24 hours not related to allergies?   . Runny Nose .  Nasal Congestion .  Sneezing   X   If you have had runny nose, nasal congestion, sneezing in the past 24 hours, has it worsened?  X   EXPOSURES - check yes or no X   Have you traveled outside the state in the past 14 days?  X   Have you been in contact with someone with a confirmed diagnosis of COVID-19 or PUI in the past 14 days without wearing appropriate PPE?  X   Have you been living in the same home as a person with confirmed diagnosis of COVID-19 or a PUI (household contact)?    X   Have you been diagnosed with COVID-19?    X              What to do next: Answered NO to all: Answered YES to anything:   Proceed with unit schedule Follow the [facility] Inpatient Flowsheet.   

## 2018-08-13 NOTE — BH Assessment (Signed)
[facility] Assessment Progress Note  Per Buford Dresser, DO, this voluntary pt requires psychiatric hospitalization at this time.  Leonia Reader, RN, [facility] has assigned pt to [facility] Rm [id number]; [facility] will be ready to receive pt at 21:00.  Pt's nurse, Butch Penny, has been notified, and agrees to discuss consent for treatment with pt.  Shuvon Rankin, FNP has also been notified.  Jalene Mullet, [place] Coordinator [phone]

## 2018-08-14 DIAGNOSIS — F250 Schizoaffective disorder, bipolar type: Secondary | ICD-10-CM

## 2018-08-14 MED ORDER — ENSURE ENLIVE PO LIQD
237.0000 mL | Freq: Every day | ORAL | Status: DC | PRN
  Administered 2018-08-14 – 2018-08-20 (×7): 237 mL via ORAL
  Filled 2018-08-14 (×7): qty 237

## 2018-08-14 MED ORDER — OLANZAPINE 5 MG PO TABS
5.0000 mg | ORAL_TABLET | Freq: Every day | ORAL | Status: DC
  Administered 2018-08-14: 5 mg via ORAL
  Filled 2018-08-14 (×3): qty 1

## 2018-08-14 MED ORDER — ENSURE ENLIVE PO LIQD: 237.0000 mL | Freq: Two times a day (BID) | ORAL | Status: DC

## 2018-08-14 MED ORDER — LORAZEPAM 1 MG PO TABS
1.0000 mg | ORAL_TABLET | ORAL | Status: AC | PRN
  Administered 2018-08-14: 21:00:00 1 mg via ORAL
  Filled 2018-08-14: qty 1

## 2018-08-14 MED ORDER — VITAMIN B-1 100 MG PO TABS
100.0000 mg | ORAL_TABLET | Freq: Every day | ORAL | Status: DC
  Administered 2018-08-15: 100 mg via ORAL
  Filled 2018-08-14 (×4): qty 1

## 2018-08-14 MED ORDER — OLANZAPINE 10 MG PO TBDP
10.0000 mg | ORAL_TABLET | Freq: Three times a day (TID) | ORAL | Status: DC | PRN
  Administered 2018-08-14 – 2018-08-18 (×4): 10 mg via ORAL
  Filled 2018-08-14 (×4): qty 1

## 2018-08-14 MED ORDER — ZIPRASIDONE MESYLATE 20 MG IM SOLR
20.0000 mg | INTRAMUSCULAR | Status: AC | PRN
  Administered 2018-08-20: 20 mg via INTRAMUSCULAR
  Filled 2018-08-14: qty 20

## 2018-08-14 MED ORDER — FLUOXETINE HCL 20 MG PO CAPS
40.0000 mg | ORAL_CAPSULE | Freq: Every day | ORAL | Status: DC
  Administered 2018-08-15: 40 mg via ORAL
  Filled 2018-08-14 (×3): qty 2
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
## 2018-08-14 NOTE — Progress Notes (Signed)
Nutrition Brief Note RD working remotely.   Patient identified on the Malnutrition Screening Tool (MST) Report  Wt Readings from Last 15 Encounters:  08/14/18 76.2 kg  05/22/18 71.7 kg  05/22/18 77.1 kg  05/01/18 77.1 kg  10/26/17 81.2 kg  10/10/17 73.5 kg  10/09/17 81.6 kg  03/27/16 85.3 kg    Body mass index is 21.57 kg/m. Patient meets criteria for normal weight based on current BMI. Patient was admitted for SI, auditory and visual hallucinations, and depression.  Current diet order is Regular and patient is consuming meals and snacks as desired at this time. Labs and medications reviewed.   Ensure Enlive was ordered BID per ONS protocol at the time of admission; each supplement provides 350 kcal and 20 grams protein. Will change order to once/day PRN. No additional nutrition interventions warranted at this time. If nutrition issues arise, please consult RD.      Jarome Matin, MS, RD, LDN, [facility] Inpatient Clinical Dietitian Pager # [phone] After hours/weekend pager # [phone]

## 2018-08-14 NOTE — Plan of Care (Signed)
Nursing Progress Note [phone]  Patient presents irritable this morning with complaints of auditory hallucinations. Patient states Haldol has been ineffective for him but is willing to take medicine and discuss this with the doctor. Patient compliant with scheduled medications and did not request PRNs. Patient currently reports passive SI with no plan/intent. Patient denies HI.  Patient is educated about and provided medication per provider's orders. Patient safety maintained with q15 min safety checks and high fall risk precautions. Emotional support given, 1:1 interaction, and active listening provided. Patient encouraged to attend meals, groups, and work on treatment plan and goals. Labs, vital signs and patient behavior monitored throughout shift. Patient encouraged to wear mask when in the milieu and is educated about coronavirus infection control precautions.  Patient refusing to wear mask on the unit but is compliant to wear mask off unit. Patient contracts for safety with staff. Patient remains safe on the unit at this time and agrees to come to staff with any issues/concerns. Patient is interacting with peers appropriately on the unit. Will continue to support and monitor.   Problem: Safety: Goal: Periods of time without injury will increase Outcome: Progressing   Problem: Medication: Goal: Compliance with prescribed medication regimen will improve Outcome: Progressing    [place] NOVEL CORONAVIRUS (COVID-19) DAILY CHECK-OFF SYMPTOMS - answer yes or no to each - every day NO YES  Have you had a fever in the past 24 hours?  Fever (Temp > 37.80C / 100F) X   Have you had any of these symptoms in the past 24 hours? New Cough  Sore Throat   Shortness of Breath  Difficulty Breathing  Unexplained Body Aches   X   Have you had any one of these symptoms in the past 24 hours not related to allergies?   Runny Nose  Nasal Congestion  Sneezing   X   If you have had runny nose, nasal  congestion, sneezing in the past 24 hours, has it worsened?  X   EXPOSURES - check yes or no X   Have you traveled outside the state in the past 14 days?  X   Have you been in contact with someone with a confirmed diagnosis of COVID-19 or PUI in the past 14 days without wearing appropriate PPE?  X   Have you been living in the same home as a person with confirmed diagnosis of COVID-19 or a PUI (household contact)?    X   Have you been diagnosed with COVID-19?    X              What to do next: Answered NO to all: Answered YES to anything:   Proceed with unit schedule Follow the [facility] Inpatient Flowsheet.

## 2018-08-14 NOTE — Progress Notes (Signed)
CSW attempted to meet with patient to complete PSA. Patient was sleeping and did not respond to [facility] calling patient's name or knocking on door.  CSW will follow up.  Stephanie Acre, LCSW-A Clinical Social Worker

## 2018-08-14 NOTE — Progress Notes (Signed)
D: Pt passive SI/ AVH- contracts for safety. Pt is pleasant and cooperative. Pt stated he was doing ok.  A: Pt was offered support and encouragement. Pt was given scheduled medications. Pt was encourage to attend groups. Q 15 minute checks were done for safety.  R:Pt attends groups and interacts well with peers and staff. Pt is taking medication. Pt receptive to treatment and safety maintained on unit.

## 2018-08-14 NOTE — Progress Notes (Signed)
Patient ID: Edwin Henry, male   DOB: [birth year], 58 y.o.   MRN: [id number] Admission Note:  D: 58 yr male who presents VC in no acute distress for the treatment of SI / AVH and Depression from the Observation unit . Pt appears flat and depressed. Pt was calm and cooperative with admission process. Pt presents with passive SI and contracts for safety upon admission. Pt passive SI/ AVH at this time, but contracts for safety. Pt stated he was off his medications for a few weeks, pt had not seen people from [place] that manage his medications. Pt decided to get help before things got worse.   A:POC and unit policies explained and understanding verbalized. Consents obtained. Food and fluids offered, and fluids accepted.   R:Pt had no additional questions or concerns.

## 2018-08-14 NOTE — Tx Team (Signed)
Interdisciplinary Treatment and Diagnostic Plan Update  08/14/2018 Time of Session: 9:00am Merryl HackerBilly Eckardt MRN: [id number]  Principal Diagnosis: MDD (major depressive disorder), recurrent, severe, with psychosis (HCC)  Secondary Diagnoses: Principal Problem:   MDD (major depressive disorder), recurrent, severe, with psychosis (HCC) Active Problems:   Alcohol use disorder, moderate, dependence (HCC)   Current Medications:  Current Facility-Administered Medications  Medication Dose Route Frequency Provider Last Rate Last Dose  . acetaminophen (TYLENOL) tablet 650 mg  650 mg Oral Q6H PRN Rankin, Shuvon B, NP   650 mg at 08/12/18 2041  . alum & mag hydroxide-simeth (MAALOX/MYLANTA) 200-200-20 MG/5ML suspension 30 mL  30 mL Oral Q4H PRN Rankin, Shuvon B, NP      . benztropine (COGENTIN) tablet 1 mg  1 mg Oral BID Rankin, Shuvon B, NP   1 mg at 08/14/18 0754  . chlordiazePOXIDE (LIBRIUM) capsule 25 mg  25 mg Oral Q6H PRN Rankin, Shuvon B, NP      . feeding supplement (ENSURE ENLIVE) (ENSURE ENLIVE) liquid 237 mL  237 mL Oral Daily PRN Cherly BeachNorman, Jacqueline J, DO      . FLUoxetine (PROZAC) capsule 20 mg  20 mg Oral Daily Rankin, Shuvon B, NP   20 mg at 08/14/18 0754  . haloperidol (HALDOL) tablet 10 mg  10 mg Oral QHS Rankin, Shuvon B, NP   10 mg at 08/13/18 2044  . haloperidol (HALDOL) tablet 5 mg  5 mg Oral Daily Rankin, Shuvon B, NP   5 mg at 08/14/18 0754  . hydrOXYzine (ATARAX/VISTARIL) tablet 25 mg  25 mg Oral Q6H PRN Rankin, Shuvon B, NP   25 mg at 08/13/18 2234  . loperamide (IMODIUM) capsule 2-4 mg  2-4 mg Oral PRN Rankin, Shuvon B, NP      . magnesium hydroxide (MILK OF MAGNESIA) suspension 30 mL  30 mL Oral Daily PRN Rankin, Shuvon B, NP      . multivitamin with minerals tablet 1 tablet  1 tablet Oral Daily Rankin, Shuvon B, NP   1 tablet at 08/14/18 0754  . ondansetron (ZOFRAN-ODT) disintegrating tablet 4 mg  4 mg Oral Q6H PRN Rankin, Shuvon B, NP      . traZODone (DESYREL) tablet 50 mg   50 mg Oral QHS,MR X 1 Rankin, Shuvon B, NP   50 mg at 08/13/18 2234   PTA Medications: Medications Prior to Admission  Medication Sig Dispense Refill Last Dose  . benztropine (COGENTIN) 1 MG tablet Take 1 tablet (1 mg total) by mouth 2 (two) times daily. 60 tablet 0   . FLUoxetine (PROZAC) 20 MG capsule Take 20 mg by mouth at bedtime.     . haloperidol (HALDOL) 10 MG tablet Take 1 tablet (10 mg total) by mouth at bedtime. 30 tablet 0   . haloperidol (HALDOL) 5 MG tablet Take 1 tablet (5 mg total) by mouth every morning. 30 tablet 0   . hydrOXYzine (ATARAX/VISTARIL) 50 MG tablet Take 1 tablet (50 mg total) by mouth every 6 (six) hours as needed for anxiety. 30 tablet 0   . prazosin (MINIPRESS) 5 MG capsule Take 5 mg by mouth at bedtime.     . traZODone (DESYREL) 50 MG tablet Take 1 tablet (50 mg total) by mouth at bedtime and may repeat dose one time if needed. 30 tablet 0     Patient Stressors: Financial difficulties Medication change or noncompliance  Patient Strengths: Ability for insight Wellsite geologistCommunication skills General fund of knowledge Motivation for treatment/growth  Treatment Modalities: Medication  Management, Group therapy, Case management,  1 to 1 session with clinician, Psychoeducation, Recreational therapy.   Physician Treatment Plan for Primary Diagnosis: MDD (major depressive disorder), recurrent, severe, with psychosis (HCC) Long Term Goal(s):     Short Term Goals:    Medication Management: Evaluate patient's response, side effects, and tolerance of medication regimen.  Therapeutic Interventions: 1 to 1 sessions, Unit Group sessions and Medication administration.  Evaluation of Outcomes: Progressing  Physician Treatment Plan for Secondary Diagnosis: Principal Problem:   MDD (major depressive disorder), recurrent, severe, with psychosis (HCC) Active Problems:   Alcohol use disorder, moderate, dependence (HCC)  Long Term Goal(s):     Short Term Goals:        Medication Management: Evaluate patient's response, side effects, and tolerance of medication regimen.  Therapeutic Interventions: 1 to 1 sessions, Unit Group sessions and Medication administration.  Evaluation of Outcomes: Progressing   RN Treatment Plan for Primary Diagnosis: MDD (major depressive disorder), recurrent, severe, with psychosis (HCC) Long Term Goal(s): Knowledge of disease and therapeutic regimen to maintain health will improve  Short Term Goals: Ability to demonstrate self-control, Ability to identify and develop effective coping behaviors will improve and Compliance with prescribed medications will improve  Medication Management: RN will administer medications as ordered by provider, will assess and evaluate patient's response and provide education to patient for prescribed medication. RN will report any adverse and/or side effects to prescribing provider.  Therapeutic Interventions: 1 on 1 counseling sessions, Psychoeducation, Medication administration, Evaluate responses to treatment, Monitor vital signs and CBGs as ordered, Perform/monitor CIWA, COWS, AIMS and Fall Risk screenings as ordered, Perform wound care treatments as ordered.  Evaluation of Outcomes: Progressing   LCSW Treatment Plan for Primary Diagnosis: MDD (major depressive disorder), recurrent, severe, with psychosis (HCC) Long Term Goal(s): Safe transition to appropriate next level of care at discharge, Engage patient in therapeutic group addressing interpersonal concerns.  Short Term Goals: Engage patient in aftercare planning with referrals and resources, Increase social support, Identify triggers associated with mental health/substance abuse issues and Increase skills for wellness and recovery  Therapeutic Interventions: Assess for all discharge needs, 1 to 1 time with Social worker, Explore available resources and support systems, Assess for adequacy in community support network, Educate family and  significant other(s) on suicide prevention, Complete Psychosocial Assessment, Interpersonal group therapy.  Evaluation of Outcomes: Progressing   Progress in Treatment: Attending groups: Yes. Participating in groups: Yes. Taking medication as prescribed: No. Toleration medication: Yes. Family/Significant other contact made: No, will contact:  supports if consents are gratned Patient understands diagnosis: Yes. Discussing patient identified problems/goals with staff: Yes. Medical problems stabilized or resolved: Yes. Denies suicidal/homicidal ideation: No. Issues/concerns per patient self-inventory: Yes.  New problem(s) identified: Yes, Describe:  homeless, no income, limited supports  New Short Term/Long Term Goal(s): detox, medication management for mood stabilization; elimination of SI thoughts; development of comprehensive mental wellness/sobriety plan.  Patient Goals:    Discharge Plan or Barriers: CSW continuing to assess for appropriate referrals. Patient has previously followed up with [facility].  Reason for Continuation of Hospitalization: Anxiety Depression Suicidal ideation  Estimated Length of Stay: 3-5 days  Attendees: Patient: 08/14/2018 10:10 AM  Physician:  08/14/2018 10:10 AM  Nursing:  08/14/2018 10:10 AM  RN Care Manager: 08/14/2018 10:10 AM  Social Worker: Enid Cutterharlotte Timesha Cervantez, LCSWA 08/14/2018 10:10 AM  Recreational Therapist:  08/14/2018 10:10 AM  Other:  08/14/2018 10:10 AM  Other:  08/14/2018 10:10 AM  Other: 08/14/2018 10:10 AM  Scribe for Treatment Team: Joellen Jersey, Latanya Presser 08/14/2018 10:10 AM

## 2018-08-14 NOTE — Tx Team (Signed)
Initial Treatment Plan 08/14/2018 2:17 AM Edwin Henry VXB:[id number]    PATIENT STRESSORS: Financial difficulties Medication change or noncompliance   PATIENT STRENGTHS: Ability for insight Communication skills General fund of knowledge Motivation for treatment/growth   PATIENT IDENTIFIED PROBLEMS: " help with voices"  Risk for suicide  depression                 DISCHARGE CRITERIA:  Improved stabilization in mood, thinking, and/or behavior Verbal commitment to aftercare and medication compliance  PRELIMINARY DISCHARGE PLAN: Attend PHP/IOP Outpatient therapy  PATIENT/FAMILY INVOLVEMENT: This treatment plan has been presented to and reviewed with the patient, Edwin Henry.  The patient and family have been given the opportunity to ask questions and make suggestions.  Providence Crosby, RN 08/14/2018, 2:17 AM

## 2018-08-14 NOTE — [facility] Suicide Risk Assessment (Addendum)
[facility]   Nursing information obtained from:  Patient Demographic factors:  Male, Low socioeconomic status Current Mental Status:  Suicidal ideation indicated by patient, Suicide plan Loss Factors:  Decline in physical health Historical Factors:  NA Risk Reduction Factors:  Sense of responsibility to family  Total Time spent with patient: 45 minutes Principal Problem: MDD (major depressive disorder), recurrent, severe, with psychosis (HCC) Diagnosis:  Principal Problem:   MDD (major depressive disorder), recurrent, severe, with psychosis (HCC) Active Problems:   Alcohol use disorder, moderate, dependence (HCC)  Subjective Data:   Continued Clinical Symptoms:  Alcohol Use Disorder Identification Test Final Score (AUDIT): 4 The "Alcohol Use Disorders Identification Test", Guidelines for Use in Primary Care, Second Edition.  World Science writerHealth Organization [facility](WHO). Score between 0-7:  no or low risk or alcohol related problems. Score between 8-15:  moderate risk of alcohol related problems. Score between 16-19:  high risk of alcohol related problems. Score 20 or above:  warrants further diagnostic evaluation for alcohol dependence and treatment.   CLINICAL FACTORS:  [id number], single, has a 58 year old son. Patient lives with mother. On disability. He presented to ED via [employer], whom he states he called . He reports worsening depression, recent suicidal ideations, neuro-vegetative symptoms. He also states he has chronic hallucinations which wax and wane but have worsened over the last few weeks. States he hears voices telling him to walk into traffic, and also describes brief visual hallucinations, such as seeing " shadows on the walls".  Describes neuro-vegetative symptoms leading to admission- poor appetite, poor sleep, decreased energy, some anhedonia.  He denies regular alcohol consumption and states he has been drinking about once a week ( 4 drinks per episode). He  reports occasional cannabis and cocaine use, but denies pattern of dependence or abuse . Admission UDS positive for Cocaine, Cannabis. Admission BAL negative.  Denies medical illnesses. NKDA. Smokes 1/2 PPD  Home medications- Haldol 5 mgrs QAM and 10 mgrs QHS, Cogentin 1 mgr BID, Prozac 20 mgrs QDAY, Minipress 5 mgrs QHS.  History of chronic mental illness, describes history of chronic hallucinations. Also endorses long history of depression. Reports he has been diagnosed with Schizophrenia=in the past . Denies history of suicide attempts . Reports history of prior psychiatric admissions. [facility], depression. At the time was diagnosed with MDD with Psychotic Features, was discharged on Zoloft, Haldol, Cogentin.  Denies history of mental illness in family  Dx- Consider Schizoaffective Disorder by history .   Plan - Inpatient admission. We discussed options- patient states he does not feel Haloperidol is working as much and reports persistent auditory hallucinations in spite of taking this medication. Wants to try another antipsychotic medication. Agrees to Zyprexa trial, which may also help sleep, appetite which he characterizes as fair at present.\ It is unclear how regularly he has been taking Minipress. For now will continue at 1 mgr QHS. Patient was started on Librium PRNs on 7/6 for alcohol WDL symptoms if needed- currently no symptoms of WDL- no tremors, no diaphoresis, no restlessness, vitals stable. Reports was drinking once per week, and BAL negative . Will discontinue Librium detox protocol.  Decrease Haldol to 5 mgrs QDAY Start Zyprexa 5 mgrs QHS  Increase Prozac to 40 mgrs QDAY Continue Minipress 1 mgr QHS  Agitation Protocol as needed for acute agitation    Musculoskeletal: Strength & Muscle Tone: within normal limits Gait & Station: normal Patient leans: N/A  Psychiatric  Specialty Exam: Physical Exam  ROS no headache, no  chest pain, no shortness of breath, no cough, no vomiting, no fever or chills  Blood pressure 123/73, pulse 63, temperature 98.1 F (36.7 C), temperature source Oral, resp. rate 18, height 6\' 2"  (1.88 m), weight 76.2 kg, SpO2 99 %.Body mass index is 21.57 kg/m.  General Appearance: Fairly Groomed  Eye Contact:  Fair  Speech:  Normal Rate  Volume:  Normal  Mood:  Depressed  Affect:  vaguely blunted, anxious   Thought Process:  Linear and Descriptions of Associations: Intact  Orientation:  Other:  fully alert and attentive  Thought Content:  reports auditory /visual hallucinations, no delusions expressed, currently does not appear internally preoccupied   Suicidal Thoughts:  No denies current suicidal or self injurious ideations, contracts for safety on unit, denies homicidal or violent ideations  Homicidal Thoughts:  No  Memory:  recent and remote grossly intact   Judgement:  Fair  Insight:  Fair  Psychomotor Activity:  Decreased- no current agitation or restlessness   Concentration:  Concentration: Good and Attention Span: Good  Recall:  Good  Fund of Knowledge:  Good  Language:  Good  Akathisia:  Negative  Handed:  Right  AIMS (if indicated):     Assets:  Communication Skills Desire for Improvement Resilience  ADL's:  Intact  Cognition:  WNL  Sleep:  Number of Hours: 3.5      COGNITIVE FEATURES THAT CONTRIBUTE TO RISK:  Closed-mindedness and Loss of executive function    SUICIDE RISK:   Moderate:  Frequent suicidal ideation with limited intensity, and duration, some specificity in terms of plans, no associated intent, good self-control, limited dysphoria/symptomatology, some risk factors present, and identifiable protective factors, including available and accessible social support.  PLAN OF CARE: Patient will be admitted to inpatient psychiatric unit for stabilization and safety. Will provide and encourage milieu participation. Provide medication management and maked  adjustments as needed.  Will follow daily.    I certify that inpatient services furnished can reasonably be expected to improve the patient's condition.   Jenne Campus, MD 08/14/2018, 2:17 PM

## 2018-08-15 LAB — CBC WITH DIFFERENTIAL/PLATELET
Abs Immature Granulocytes: 0.02 10*3/uL (ref 0.00–0.07)
Basophils Absolute: 0.1 10*3/uL (ref 0.0–0.1)
Basophils Relative: 1 %
Eosinophils Absolute: 0.2 10*3/uL (ref 0.0–0.5)
Eosinophils Relative: 2 %
HCT: 53.8 % — ABNORMAL HIGH (ref 39.0–52.0)
Hemoglobin: 17.5 g/dL — ABNORMAL HIGH (ref 13.0–17.0)
Immature Granulocytes: 0 %
Lymphocytes Relative: 34 %
Lymphs Abs: 2.8 10*3/uL (ref 0.7–4.0)
MCH: 30.6 pg (ref 26.0–34.0)
MCHC: 32.5 g/dL (ref 30.0–36.0)
MCV: 94.2 fL (ref 80.0–100.0)
Monocytes Absolute: 0.6 10*3/uL (ref 0.1–1.0)
Monocytes Relative: 7 %
Neutro Abs: 4.5 10*3/uL (ref 1.7–7.7)
Neutrophils Relative %: 56 %
Platelets: 315 10*3/uL (ref 150–400)
RBC: 5.71 MIL/uL (ref 4.22–5.81)
RDW: 14.1 % (ref 11.5–15.5)
WBC: 8.2 10*3/uL (ref 4.0–10.5)
nRBC: 0 % (ref 0.0–0.2)

## 2018-08-15 LAB — COMPREHENSIVE METABOLIC PANEL
ALT: 23 U/L (ref 0–44)
AST: 18 U/L (ref 15–41)
Albumin: 4.5 g/dL (ref 3.5–5.0)
Alkaline Phosphatase: 86 U/L (ref 38–126)
Anion gap: 10 (ref 5–15)
BUN: 17 mg/dL (ref 6–20)
CO2: 25 mmol/L (ref 22–32)
Calcium: 9.5 mg/dL (ref 8.9–10.3)
Chloride: 101 mmol/L (ref 98–111)
Creatinine, Ser: 0.72 mg/dL (ref 0.61–1.24)
GFR calc Af Amer: >60 mL/min (ref >60)
GFR calc non Af Amer: >60 mL/min (ref >60)
Glucose, Bld: 102 mg/dL — ABNORMAL HIGH (ref 70–99)
Potassium: 4.3 mmol/L (ref 3.5–5.1)
Sodium: 136 mmol/L (ref 135–145)
Total Bilirubin: 0.2 mg/dL — ABNORMAL LOW (ref 0.3–1.2)
Total Protein: 8 g/dL (ref 6.5–8.1)

## 2018-08-15 LAB — AMMONIA: Ammonia: 33 umol/L (ref 9–35)

## 2018-08-15 LAB — VITAMIN B12: Vitamin B-12: 534 pg/mL (ref 180–914)

## 2018-08-15 LAB — CK: Total CK: 55 U/L (ref 49–397)

## 2018-08-15 MED ORDER — LORAZEPAM 1 MG PO TABS
1.0000 mg | ORAL_TABLET | Freq: Four times a day (QID) | ORAL | Status: AC | PRN
  Administered 2018-08-15 (×2): 1 mg via ORAL
  Filled 2018-08-15 (×2): qty 1

## 2018-08-15 MED ORDER — LORAZEPAM 1 MG PO TABS
1.0000 mg | ORAL_TABLET | Freq: Every day | ORAL | Status: AC
  Administered 2018-08-18 – 2018-08-19 (×2): 1 mg via ORAL
  Filled 2018-08-15 (×2): qty 1

## 2018-08-15 MED ORDER — HALOPERIDOL 5 MG PO TABS
5.0000 mg | ORAL_TABLET | Freq: Two times a day (BID) | ORAL | Status: DC
  Administered 2018-08-15 – 2018-08-16 (×2): 5 mg via ORAL
  Filled 2018-08-15 (×7): qty 1

## 2018-08-15 MED ORDER — LORAZEPAM 1 MG PO TABS
1.0000 mg | ORAL_TABLET | Freq: Once | ORAL | Status: AC
  Administered 2018-08-15: 04:00:00 1 mg via ORAL
  Filled 2018-08-15: qty 1

## 2018-08-15 MED ORDER — VITAMIN B-1 100 MG PO TABS
500.0000 mg | ORAL_TABLET | Freq: Two times a day (BID) | ORAL | Status: AC
  Administered 2018-08-15 – 2018-08-17 (×3): 500 mg via ORAL
  Filled 2018-08-15 (×4): qty 5

## 2018-08-15 MED ORDER — LORAZEPAM 1 MG PO TABS
1.0000 mg | ORAL_TABLET | Freq: Two times a day (BID) | ORAL | Status: AC
  Administered 2018-08-17 (×2): 1 mg via ORAL
  Filled 2018-08-15: qty 1

## 2018-08-15 MED ORDER — LORAZEPAM 1 MG PO TABS
1.0000 mg | ORAL_TABLET | Freq: Four times a day (QID) | ORAL | Status: AC
  Administered 2018-08-15 – 2018-08-16 (×4): 1 mg via ORAL
  Filled 2018-08-15 (×4): qty 1

## 2018-08-15 MED ORDER — LORAZEPAM 1 MG PO TABS
1.0000 mg | ORAL_TABLET | Freq: Three times a day (TID) | ORAL | Status: AC
  Administered 2018-08-16 (×2): 1 mg via ORAL
  Filled 2018-08-15 (×3): qty 1

## 2018-08-15 MED ORDER — TRAZODONE HCL 100 MG PO TABS
100.0000 mg | ORAL_TABLET | Freq: Every evening | ORAL | Status: DC | PRN
  Administered 2018-08-15 (×2): 100 mg via ORAL
  Filled 2018-08-15: qty 1

## 2018-08-15 MED ORDER — TRAZODONE HCL 50 MG PO TABS
50.0000 mg | ORAL_TABLET | Freq: Every evening | ORAL | Status: DC | PRN
  Filled 2018-08-15: qty 1

## 2018-08-15 NOTE — Progress Notes (Signed)
Patient ID: Edwin Henry, male   DOB: [birth year], 59 y.o.   MRN: [id number]  MHT reports patient is confused and requires redirection multiple times this morning. Patient observed attempting to go to [address] hall, trying to enter the linen closet, and become irritable towards staff when redirected. Upon approach by Probation officer, patient does appear oriented x4 but his thinking is disorganized. Patient unable to answer self-inventory sheet and asks staff "is my mama leaving?" Patient also observed asking about "a man" but cannot articulate who this is. Vitals obtained and stable. MD notified and assessed patient. Orders received and patient to be medicated. Will continue to support and monitor.

## 2018-08-15 NOTE — Progress Notes (Signed)
CSW attempted to meet with patient a second time to complete psychosocial assessment.  Patient presented as drowsy, confused, and disoriented. He also demonstrated thought blocking. Patient would start to respond to CSW's question, would pause, and rest his eyes or give an unrelated answer.  CSW will follow up.  Stephanie Acre, LCSW-A Clinical Social Worker

## 2018-08-15 NOTE — Progress Notes (Addendum)
[facility] MD Progress Note  08/15/2018 10:39 AM Edwin Henry  MRN:  [id number] Subjective:  Patient reports persistent auditory hallucinations. Denies suicidal ideations. Denies medication side effects. Objective : I have discussed case with treatment team and have met with patient. 58 year old male, presented to hospital due to depression, suicidal ideations, neuro-vegetative symptoms,  Hallucinations.He has a history of being diagnosed with Schizophrenia in the past and had been admitted for similar presentation last year. Patient reports recent cannabis and cocaine use . Regarding alcohol , states he has been drinking once a week. Admission BAL negative, admission UDS positive for Cocaine and Cannabis.  Today patient presents with some confusion/disorientation, and report from staff is that he was restless during the night, with little sleep. He requires redirection from staff to redirect him to his room or day room.  At this time he presents alert, although somewhat distractible. He is oriented to self, partially to place and time - reports day is Wednesday, month May. His vitals are stable ( 124/84, pulse 91 , temp 97.3 ) and he is not presenting with distal tremors or agitation. He reports persistent auditory hallucinations but at this time he does not present overtly internally preoccupied. He also reports seeing " shadows". Currently denies suicidal ideations. No motor/muscular rigidity, no cog-wheeling,slightly stooped gait.  I have again inquired about alcohol consumption pattern. He states " I have slowed down a lot", and states was drinking " every few days".   Principal Problem: MDD (major depressive disorder), recurrent, severe, with psychosis ([place]) Diagnosis: Principal Problem:   MDD (major depressive disorder), recurrent, severe, with psychosis ([place]) Active Problems:   Alcohol use disorder, moderate, dependence ([place])   Schizoaffective disorder, bipolar type ([place])  Total Time spent  with patient: 20 minutes  Past Psychiatric History:  Past Medical History:  Past Medical History:  Diagnosis Date  . Schizophrenia [facility])     Past Surgical History:  Procedure Laterality Date  . KNEE SURGERY     Family History: History reviewed. No pertinent family history. Family Psychiatric  History:  Social History:  Social History   Substance and Sexual Activity  Alcohol Use Yes   Comment: 1-2 beers every 2 days     Social History   Substance and Sexual Activity  Drug Use Yes  . Types: Marijuana, Cocaine   Comment: occasional THC use    Social History   Socioeconomic History  . Marital status: Single    Spouse name: Not on file  . Number of children: Not on file  . Years of education: Not on file  . Highest education level: Not on file  Occupational History  . Not on file  Social Needs  . Financial resource strain: Not on file  . Food insecurity    Worry: Not on file    Inability: Not on file  . Transportation needs    Medical: Not on file    Non-medical: Not on file  Tobacco Use  . Smoking status: Current Every Day Smoker    Packs/day: 0.50  . Smokeless tobacco: Current User    Types: Chew  Substance and Sexual Activity  . Alcohol use: Yes    Comment: 1-2 beers every 2 days  . Drug use: Yes    Types: Marijuana, Cocaine    Comment: occasional THC use  . Sexual activity: Yes    Birth control/protection: Condom  Lifestyle  . Physical activity    Days per week: Not on file    Minutes  per session: Not on file  . Stress: Not on file  Relationships  . Social Herbalist on phone: Not on file    Gets together: Not on file    Attends religious service: Not on file    Active member of club or organization: Not on file    Attends meetings of clubs or organizations: Not on file    Relationship status: Not on file  Other Topics Concern  . Not on file  Social History Narrative  . Not on file   Additional Social History:    Pain  Medications: see MAR Prescriptions: see MAR Over the Counter: see MAR History of alcohol / drug use?: Yes Longest period of sobriety (when/how long): none reported Negative Consequences of Use: Personal relationships, Work / Youth worker, Museum/gallery curator Name of Substance 1: alcohol 1 - Age of First Use: 18 1 - Amount (size/oz): quart of beer 1 - Frequency: weekly 1 - Duration: since onset 1 - Last Use / Amount: today Name of Substance 2: cocaine 2 - Age of First Use: not assessed 2 - Amount (size/oz): $40 2 - Frequency: occasional 2 - Duration: unknown 2 - Last Use / Amount: few days ago Name of Substance 3: Marijuana 3 - Age of First Use: not assessed 3 - Amount (size/oz): 1 gram 3 - Frequency: occasionally 3 - Duration: unknown 3 - Last Use / Amount: patient states that it has been awhile  Sleep: Poor  Appetite:  Fair  Current Medications: Current Facility-Administered Medications  Medication Dose Route Frequency Provider Last Rate Last Dose  . acetaminophen (TYLENOL) tablet 650 mg  650 mg Oral Q6H PRN Rankin, Shuvon B, NP   650 mg at 08/12/18 2041  . alum & mag hydroxide-simeth (MAALOX/MYLANTA) 200-200-20 MG/5ML suspension 30 mL  30 mL Oral Q4H PRN Rankin, Shuvon B, NP      . benztropine (COGENTIN) tablet 1 mg  1 mg Oral BID Rankin, Shuvon B, NP   1 mg at 08/15/18 0807  . feeding supplement (ENSURE ENLIVE) (ENSURE ENLIVE) liquid 237 mL  237 mL Oral Daily PRN Faythe Dingwall, DO   237 mL at 08/15/18 0240  . FLUoxetine (PROZAC) capsule 40 mg  40 mg Oral Daily Forrest Demuro, Myer Peer, MD   40 mg at 08/15/18 0806  . haloperidol (HALDOL) tablet 5 mg  5 mg Oral Daily Rankin, Shuvon B, NP   5 mg at 08/15/18 0806  . loperamide (IMODIUM) capsule 2-4 mg  2-4 mg Oral PRN Rankin, Shuvon B, NP      . LORazepam (ATIVAN) tablet 1 mg  1 mg Oral Q6H PRN Lynnett Langlinais A, MD      . LORazepam (ATIVAN) tablet 1 mg  1 mg Oral QID Caylor Tallarico, Myer Peer, MD       Followed by  . [START ON 08/16/2018] LORazepam  (ATIVAN) tablet 1 mg  1 mg Oral TID Allora Bains, Myer Peer, MD       Followed by  . [START ON 08/17/2018] LORazepam (ATIVAN) tablet 1 mg  1 mg Oral BID Almira Phetteplace, Myer Peer, MD       Followed by  . [START ON 08/19/2018] LORazepam (ATIVAN) tablet 1 mg  1 mg Oral Daily Ceola Para A, MD      . magnesium hydroxide (MILK OF MAGNESIA) suspension 30 mL  30 mL Oral Daily PRN Rankin, Shuvon B, NP      . multivitamin with minerals tablet 1 tablet  1 tablet Oral Daily Rankin,  Shuvon B, NP   1 tablet at 08/15/18 0806  . OLANZapine (ZYPREXA) tablet 5 mg  5 mg Oral QHS Raylee Adamec, Myer Peer, MD   5 mg at 08/14/18 2115  . OLANZapine zydis (ZYPREXA) disintegrating tablet 10 mg  10 mg Oral Q8H PRN Jamesetta Greenhalgh, Myer Peer, MD   10 mg at 08/14/18 1959   And  . ziprasidone (GEODON) injection 20 mg  20 mg Intramuscular PRN Kelsye Loomer, Myer Peer, MD      . thiamine (VITAMIN B-1) tablet 100 mg  100 mg Oral Daily Jaella Weinert, Myer Peer, MD   100 mg at 08/15/18 3300    Lab Results:  Results for orders placed or performed during the hospital encounter of 08/12/18 (from the past 48 hour(s))  SARS Coronavirus 2 (CEPHEID - Performed in [place] hospital lab), Hosp Order     Status: None   Collection Time: 08/13/18  3:50 PM   Specimen: Nasopharyngeal Swab  Result Value Ref Range   SARS Coronavirus 2 NEGATIVE NEGATIVE    Comment: (NOTE) If result is NEGATIVE SARS-CoV-2 target nucleic acids are NOT DETECTED. The SARS-CoV-2 RNA is generally detectable in upper and lower  respiratory specimens during the acute phase of infection. The lowest  concentration of SARS-CoV-2 viral copies this assay can detect is 250  copies / mL. A negative result does not preclude SARS-CoV-2 infection  and should not be used as the sole basis for treatment or other  patient management decisions.  A negative result may occur with  improper specimen collection / handling, submission of specimen other  than nasopharyngeal swab, presence of viral mutation(s) within  the  areas targeted by this assay, and inadequate number of viral copies  (<250 copies / mL). A negative result must be combined with clinical  observations, patient history, and epidemiological information. If result is POSITIVE SARS-CoV-2 target nucleic acids are DETECTED. The SARS-CoV-2 RNA is generally detectable in upper and lower  respiratory specimens dur ing the acute phase of infection.  Positive  results are indicative of active infection with SARS-CoV-2.  Clinical  correlation with patient history and other diagnostic information is  necessary to determine patient infection status.  Positive results do  not rule out bacterial infection or co-infection with other viruses. If result is PRESUMPTIVE POSTIVE SARS-CoV-2 nucleic acids MAY BE PRESENT.   A presumptive positive result was obtained on the submitted specimen  and confirmed on repeat testing.  While 2019 novel coronavirus  (SARS-CoV-2) nucleic acids may be present in the submitted sample  additional confirmatory testing may be necessary for epidemiological  and / or clinical management purposes  to differentiate between  SARS-CoV-2 and other Sarbecovirus currently known to infect humans.  If clinically indicated additional testing with an alternate test  methodology [phone]) is advised. The SARS-CoV-2 RNA is generally  detectable in upper and lower respiratory sp ecimens during the acute  phase of infection. The expected result is Negative. Fact Sheet for Patients:  StrictlyIdeas.no Fact Sheet for Healthcare Providers: BankingDealers.co.za This test is not yet approved or cleared by the [place] FDA and has been authorized for detection and/or diagnosis of SARS-CoV-2 by FDA under an Emergency Use Authorization (EUA).  This EUA will remain in effect (meaning this test can be used) for the duration of the COVID-19 declaration under Section 564(b)(1) of the Act, 21  U.S.C. section 360bbb-3(b)(1), unless the authorization is terminated or revoked sooner. Performed at [facility] [place] [address] [place], [place] [address]  Blood Alcohol level:  Lab Results  Component Value Date   ETH <10 08/13/2018   ETH 163 (H) 16/11/9602    Metabolic Disorder Labs: Lab Results  Component Value Date   HGBA1C 5.4 08/13/2018   MPG 108.28 08/13/2018   MPG 111.15 10/11/2017   Lab Results  Component Value Date   PROLACTIN 22.1 (H) 10/11/2017   Lab Results  Component Value Date   CHOL 235 (H) 08/13/2018   TRIG 210 (H) 08/13/2018   HDL 41 08/13/2018   CHOLHDL 5.7 08/13/2018   VLDL 42 (H) 08/13/2018   LDLCALC 152 (H) 08/13/2018   LDLCALC UNABLE TO CALCULATE IF TRIGLYCERIDE OVER 400 mg/dL 10/11/2017    Physical Findings: AIMS:  , ,  ,  ,    CIWA:  CIWA-Ar Total: 5 COWS:     Musculoskeletal: Strength & Muscle Tone: within normal limits- no psychomotor agitation, although slightly restless. No tremors, no diaphoresis. Was able to sit comfortably during this session Gait & Station: normal- slightly stooped  Patient leans: N/A  Psychiatric Specialty Exam: Physical Exam  ROS denies headache or chest pain, no shortness of breath, no vomiting, no fever or chills   Blood pressure 124/84, pulse 91, temperature (!) 97.3 F (36.3 C), temperature source Oral, resp. rate 18, height '6\' 2"'  (1.88 m), weight 76.2 kg, SpO2 99 %.Body mass index is 21.57 kg/m.  General Appearance: Fairly Groomed  Eye Contact:  Fair  Speech:  Normal Rate  Volume:  Decreased  Mood:  depressed, anxious   Affect:  congruent   Thought Process: linear, circumstantial, slowed today  Orientation:  Other:  oriented to person and to" hospital", partially disoriented in time- see above  Thought Content:  reports auditory hallucinations and also " seeing shadows", does not appear internally preoccupied   Suicidal Thoughts:  No denies SI  Homicidal Thoughts:  No   Memory:  recent and remote fair   Judgement:  Fair  Insight:  fair  Psychomotor Activity:  some pacing , no overt psychomotor agitation , no tremors .   Concentration:  Concentration: Fair and Attention Span: Fair  Recall:  [employer] of Knowledge:  Fair  Language:  Fair  Akathisia:  Negative  Handed:  Right  AIMS (if indicated):     Assets:  Desire for Improvement Resilience  ADL's:  Intact  Cognition:  Impaired,  Moderate  Sleep:  Number of Hours: 0.5   Assessment -  58 year old male, presented to hospital due to depression, suicidal ideations, neuro-vegetative symptoms,  Hallucinations.He has a history of being diagnosed with Schizophrenia in the past and had been admitted for similar presentation last year. Patient reports recent cannabis and cocaine use . Regarding alcohol , states he has been drinking once a week. Admission BAL negative, admission UDS positive for Cocaine and Cannabis.  Patient is presenting confused, with some restlessness . Reports persistent auditory hallucinations, but does not appear internally preoccupied at this time and does not present with psychotic agitation. His vitals are stable and there is no fever. No rigidity or catatonia.  He has a history of alcohol abuse, but states he has been drinking less than before and only once a week. His BAL on admission ( 7/7) was negative. Presentation could be , however, related to DTs , although as noted vitals are stable. I do not suspect adverse reaction to Zyprexa because he has received this medication in the past without reported side effects. Current presentation not particularly consistent with serotonin  syndrome- no diaphoresis, no muscle rigidity, vitals stable, no pupilary dilation, no diarrhea, but will stop Prozac and Zyprexa for now.  I have also reviewed case with my colleague, Dr. Jake Samples, who has also examined patient and agrees with above assessment. We  also considered the possibility of Wernicke's, due  to history of alcohol use disorder and current confusional state,although patient had been started on thiamine. Consider increased Thiamine dose supplementation.   Treatment Plan Summary: Daily contact with patient to assess and evaluate symptoms and progress in treatment, Medication management, Plan inpatient treatment and medications as below Encourage milieu participation Encourage efforts to work on sobriety , relapse prevention efforts For now continue Haldol 5 mgrs BID for psychosis, discontinue Zyprexa. Discontinue Prozac  Start Ativan detox protocol for alcohol WDL as needed and continue thiamine and folate supplementation Check CMP, CBC , CK, Ammonia   Will continue to follow  Jenne Campus, MD 08/15/2018, 10:39 AM    Addendum 08/15/2018 at 6,15 PM  Reviewed with RN staff- patient has been calmer and able to rest/sleep this afternoon. Calmer, less intrusive . Currently resting comfortably. Vitals have remained stable.  Gabriel Earing MD

## 2018-08-15 NOTE — Plan of Care (Signed)
  Problem: Safety: Goal: Periods of time without injury will increase Outcome: Progressing   

## 2018-08-15 NOTE — Progress Notes (Signed)
Pt continuously needing to be redirected to bed. Pt up appearing confused at times and restless .

## 2018-08-15 NOTE — Progress Notes (Signed)
Patient ID: Edwin Henry, male   DOB: Jun 24, 1960, 58 y.o.   MRN: [id number]   Patient observed resting in bed for most of the day and is observed sleeping for periods of time. Patient is calm, less restless and is more easily redirected.

## 2018-08-15 NOTE — Progress Notes (Signed)
D: [facility]SI / [facility]- contracts for safety. Pt continues to be confused at times . Pt is pleasant and cooperative. Pt pt not agitated this evening. Pt oriented to year, place and person.   A: Pt was offered support and encouragement. Pt was given scheduled medications. Pt was encourage to attend groups. Q 15 minute checks were done for safety.   R:Pt attends groups and interacts well with peers and staff. Pt is taking medication. Pt has no complaints.Pt receptive to treatment and safety maintained on unit.   Problem: Safety: Goal: Periods of time without injury will increase Outcome: Progressing   Problem: Activity: Goal: Imbalance in normal sleep/wake cycle will improve Outcome: Not Progressing

## 2018-08-15 NOTE — Progress Notes (Signed)
Pt continued to appear confused at times, pt needing to be redirected. Pt slept 0.5 hrs last night.

## 2018-08-16 MED ORDER — TRAZODONE HCL 50 MG PO TABS
50.0000 mg | ORAL_TABLET | Freq: Every evening | ORAL | Status: DC | PRN
  Administered 2018-08-17: 50 mg via ORAL
  Filled 2018-08-16: qty 1

## 2018-08-16 MED ORDER — PRAZOSIN HCL 1 MG PO CAPS
1.0000 mg | ORAL_CAPSULE | Freq: Every day | ORAL | Status: DC
  Administered 2018-08-17 – 2018-08-20 (×4): 1 mg via ORAL
  Filled 2018-08-16 (×8): qty 1

## 2018-08-16 MED ORDER — HALOPERIDOL 5 MG PO TABS
5.0000 mg | ORAL_TABLET | ORAL | Status: DC
  Administered 2018-08-17 – 2018-08-18 (×2): 5 mg via ORAL
  Filled 2018-08-16 (×3): qty 1

## 2018-08-16 MED ORDER — HALOPERIDOL 5 MG PO TABS
10.0000 mg | ORAL_TABLET | Freq: Every day | ORAL | Status: DC
  Administered 2018-08-17 – 2018-08-20 (×4): 10 mg via ORAL
  Filled 2018-08-16 (×7): qty 2

## 2018-08-16 NOTE — Progress Notes (Signed)
[facility] NOVEL CORONAVIRUS (COVID-19) DAILY CHECK-OFF SYMPTOMS - answer yes or no to each - every day NO YES  Have you had a fever in the past 24 hours?  . Fever (Temp > 37.80C / 100F) X   Have you had any of these symptoms in the past 24 hours? . New Cough .  Sore Throat  .  Shortness of Breath .  Difficulty Breathing .  Unexplained Body Aches   X   Have you had any one of these symptoms in the past 24 hours not related to allergies?   . Runny Nose .  Nasal Congestion .  Sneezing   X   If you have had runny nose, nasal congestion, sneezing in the past 24 hours, has it worsened?  X   EXPOSURES - check yes or no X   Have you traveled outside the state in the past 14 days?  X   Have you been in contact with someone with a confirmed diagnosis of COVID-19 or PUI in the past 14 days without wearing appropriate PPE?  X   Have you been living in the same home as a person with confirmed diagnosis of COVID-19 or a PUI (household contact)?    X   Have you been diagnosed with COVID-19?    X              What to do next: Answered NO to all: Answered YES to anything:   Proceed with unit schedule Follow the [facility] Inpatient Flowsheet.   

## 2018-08-16 NOTE — Progress Notes (Signed)
Patient ID: Edwin Henry, male   DOB: 1960/11/06, 58 y.o.   MRN: [id number]   1:1 RN Note  D: Pt in his room sitting on the bed. Pt is still confused and has been unsteady with his gait and has been placed on 1:1 for safety. Sitter is with pt. No signs of distress or discomfort noted.  A: Pt remains on 1:1 observation for safety.  R: Pt remains safe on the unit.

## 2018-08-16 NOTE — Progress Notes (Signed)
[facility] MD Progress Note  08/16/2018 2:54 PM Edwin Henry  MRN:  [id number] Subjective:  Patient reports he is still experiencing hallucinations. He denies medication side effects. He denies suicidal ideations.  Objective : I have discussed case with treatment team and have met with patient. 58 year old male, presented to hospital due to depression, suicidal ideations, neuro-vegetative symptoms,  Hallucinations.He has a history of being diagnosed with Schizophrenia in the past and had been admitted for similar presentation last year. Patient reports recent cannabis and cocaine use . Regarding alcohol , states he has been drinking once a week. Admission BAL negative, admission UDS positive for Cocaine and Cannabis.  Patient presents alert, polite on approach. Remains confused although slightly improved compared to yesterday. He knows he is in [place], but cannot specify further, and gives date as Friday, May, 2002. Staff reports he has continued to need some gentle redirections as he walks into Nursing area or other rooms but that he has been pleasant and readily redirectable, without agitation or combativeness . He was less agitated yesterday evening and slept better as well . Denies suicidal ideations . Endorses auditory hallucinations, which he describes as making demeaning comments . Currently does not appear internally preoccupied .  Gait is slow, vaguely stooped. No rigidity or cog-wheeling .No distal tremors, no restlessness, no diaphoresis. Vitals have remained stable and he is afebrile. We have again reviewed alcohol use prior to admission- states he had been drinking " a couple of times" per week. Lab review indicates that patient has had (+) BALS on several ED visits but not on current one. Labs reviewed - BMP unremarkable, LFTs WNL, CK 55, B12 534, WBC 8.2, differential normal. HgbA1C 5.4, Ammonia 33, TSH 0.9    Principal Problem: MDD (major depressive disorder), recurrent, severe, with  psychosis ([place]) Diagnosis: Principal Problem:   MDD (major depressive disorder), recurrent, severe, with psychosis ([place]) Active Problems:   Alcohol use disorder, moderate, dependence ([place])   Schizoaffective disorder, bipolar type ([place])  Total Time spent with patient: 20 minutes  Past Psychiatric History:  Past Medical History:  Past Medical History:  Diagnosis Date  . Schizophrenia [facility])     Past Surgical History:  Procedure Laterality Date  . KNEE SURGERY     Family History: History reviewed. No pertinent family history. Family Psychiatric  History:  Social History:  Social History   Substance and Sexual Activity  Alcohol Use Yes   Comment: 1-2 beers every 2 days     Social History   Substance and Sexual Activity  Drug Use Yes  . Types: Marijuana, Cocaine   Comment: occasional THC use    Social History   Socioeconomic History  . Marital status: Single    Spouse name: Not on file  . Number of children: Not on file  . Years of education: Not on file  . Highest education level: Not on file  Occupational History  . Not on file  Social Needs  . Financial resource strain: Not on file  . Food insecurity    Worry: Not on file    Inability: Not on file  . Transportation needs    Medical: Not on file    Non-medical: Not on file  Tobacco Use  . Smoking status: Current Every Day Smoker    Packs/day: 0.50  . Smokeless tobacco: Current User    Types: Chew  Substance and Sexual Activity  . Alcohol use: Yes    Comment: 1-2 beers every 2 days  .  Drug use: Yes    Types: Marijuana, Cocaine    Comment: occasional THC use  . Sexual activity: Yes    Birth control/protection: Condom  Lifestyle  . Physical activity    Days per week: Not on file    Minutes per session: Not on file  . Stress: Not on file  Relationships  . Social Herbalist on phone: Not on file    Gets together: Not on file    Attends religious service: Not on file    Active member of  club or organization: Not on file    Attends meetings of clubs or organizations: Not on file    Relationship status: Not on file  Other Topics Concern  . Not on file  Social History Narrative  . Not on file   Additional Social History:    Pain Medications: see MAR Prescriptions: see MAR Over the Counter: see MAR History of alcohol / drug use?: Yes Longest period of sobriety (when/how long): none reported Negative Consequences of Use: Personal relationships, Work / Youth worker, Museum/gallery curator Name of Substance 1: alcohol 1 - Age of First Use: 18 1 - Amount (size/oz): quart of beer 1 - Frequency: weekly 1 - Duration: since onset 1 - Last Use / Amount: today Name of Substance 2: cocaine 2 - Age of First Use: not assessed 2 - Amount (size/oz): $40 2 - Frequency: occasional 2 - Duration: unknown 2 - Last Use / Amount: few days ago Name of Substance 3: Marijuana 3 - Age of First Use: not assessed 3 - Amount (size/oz): 1 gram 3 - Frequency: occasionally 3 - Duration: unknown 3 - Last Use / Amount: patient states that it has been awhile  Sleep: still fair  Appetite:  Fair  Current Medications: Current Facility-Administered Medications  Medication Dose Route Frequency Provider Last Rate Last Dose  . acetaminophen (TYLENOL) tablet 650 mg  650 mg Oral Q6H PRN Rankin, Shuvon B, NP   650 mg at 08/12/18 2041  . alum & mag hydroxide-simeth (MAALOX/MYLANTA) 200-200-20 MG/5ML suspension 30 mL  30 mL Oral Q4H PRN Rankin, Shuvon B, NP      . benztropine (COGENTIN) tablet 1 mg  1 mg Oral BID Rankin, Shuvon B, NP   1 mg at 08/16/18 5427  . feeding supplement (ENSURE ENLIVE) (ENSURE ENLIVE) liquid 237 mL  237 mL Oral Daily PRN Faythe Dingwall, DO   237 mL at 08/15/18 2100  . haloperidol (HALDOL) tablet 5 mg  5 mg Oral BID , Myer Peer, MD   5 mg at 08/16/18 0824  . LORazepam (ATIVAN) tablet 1 mg  1 mg Oral Q6H PRN , Myer Peer, MD   1 mg at 08/15/18 2257  . LORazepam (ATIVAN) tablet 1  mg  1 mg Oral TID , Myer Peer, MD   1 mg at 08/16/18 1203   Followed by  . [START ON 08/17/2018] LORazepam (ATIVAN) tablet 1 mg  1 mg Oral BID , Myer Peer, MD       Followed by  . [START ON 08/19/2018] LORazepam (ATIVAN) tablet 1 mg  1 mg Oral Daily ,  A, MD      . magnesium hydroxide (MILK OF MAGNESIA) suspension 30 mL  30 mL Oral Daily PRN Rankin, Shuvon B, NP      . multivitamin with minerals tablet 1 tablet  1 tablet Oral Daily Rankin, Shuvon B, NP   1 tablet at 08/16/18 0623  . OLANZapine zydis (ZYPREXA) disintegrating tablet  10 mg  10 mg Oral Q8H PRN , Myer Peer, MD   10 mg at 08/16/18 1413   And  . ziprasidone (GEODON) injection 20 mg  20 mg Intramuscular PRN , Myer Peer, MD      . thiamine (VITAMIN B-1) tablet 500 mg  500 mg Oral BID Johnn Hai, MD   500 mg at 08/16/18 4854  . traZODone (DESYREL) tablet 100 mg  100 mg Oral QHS PRN,MR X 1 Lindon Romp A, NP   100 mg at 08/15/18 2257    Lab Results:  Results for orders placed or performed during the hospital encounter of 08/12/18 (from the past 48 hour(s))  CK     Status: None   Collection Time: 08/15/18  6:07 PM  Result Value Ref Range   Total CK 55 49 - 397 U/L    Comment: Performed at [facility] [place] [address], [place], [place] [address]  Ammonia     Status: None   Collection Time: 08/15/18  6:07 PM  Result Value Ref Range   Ammonia 33 9 - 35 umol/L    Comment: Performed at [facility], [place] [address], [place], [place] [address]  CBC with Differential/Platelet     Status: Abnormal   Collection Time: 08/15/18  6:07 PM  Result Value Ref Range   WBC 8.2 4.0 - 10.5 K/uL   RBC 5.71 4.22 - 5.81 MIL/uL   Hemoglobin 17.5 (H) 13.0 - 17.0 g/dL   HCT 53.8 (H) 39.0 - 52.0 %   MCV 94.2 80.0 - 100.0 fL   MCH 30.6 26.0 - 34.0 pg   MCHC 32.5 30.0 - 36.0 g/dL   RDW 14.1 11.5 - 15.5 %   Platelets 315 150 - 400 K/uL   nRBC 0.0 0.0 - 0.2 %   Neutrophils  Relative % 56 %   Neutro Abs 4.5 1.7 - 7.7 K/uL   Lymphocytes Relative 34 %   Lymphs Abs 2.8 0.7 - 4.0 K/uL   Monocytes Relative 7 %   Monocytes Absolute 0.6 0.1 - 1.0 K/uL   Eosinophils Relative 2 %   Eosinophils Absolute 0.2 0.0 - 0.5 K/uL   Basophils Relative 1 %   Basophils Absolute 0.1 0.0 - 0.1 K/uL   Immature Granulocytes 0 %   Abs Immature Granulocytes 0.02 0.00 - 0.07 K/uL    Comment: Performed at [facility], [place] [address], [place], [place] [address]  Comprehensive metabolic panel     Status: Abnormal   Collection Time: 08/15/18  6:07 PM  Result Value Ref Range   Sodium 136 135 - 145 mmol/L   Potassium 4.3 3.5 - 5.1 mmol/L   Chloride 101 98 - 111 mmol/L   CO2 25 22 - 32 mmol/L   Glucose, Bld 102 (H) 70 - 99 mg/dL   BUN 17 6 - 20 mg/dL   Creatinine, Ser 0.72 0.61 - 1.24 mg/dL   Calcium 9.5 8.9 - 10.3 mg/dL   Total Protein 8.0 6.5 - 8.1 g/dL   Albumin 4.5 3.5 - 5.0 g/dL   AST 18 15 - 41 U/L   ALT 23 0 - 44 U/L   Alkaline Phosphatase 86 38 - 126 U/L   Total Bilirubin 0.2 (L) 0.3 - 1.2 mg/dL   GFR calc non Af Amer >60 >60 mL/min   GFR calc Af Amer >60 >60 mL/min   Anion gap 10 5 - 15    Comment: Performed at [facility], [place] [address], [place], [place] [address]  Vitamin B12     Status: None   Collection Time: 08/15/18  6:07 PM  Result Value Ref Range   Vitamin B-12 534 180 - 914 pg/mL    Comment: (NOTE) This assay is not validated for testing neonatal or myeloproliferative syndrome specimens for Vitamin B12 levels. Performed at [facility], [place] [address], [place], [place] [address]     Blood Alcohol level:  Lab Results  Component Value Date   ETH <10 08/13/2018   ETH 163 (H) 09/62/8366    Metabolic Disorder Labs: Lab Results  Component Value Date   HGBA1C 5.4 08/13/2018   MPG 108.28 08/13/2018   MPG 111.15 10/11/2017   Lab Results  Component Value Date   PROLACTIN 22.1 (H) 10/11/2017    Lab Results  Component Value Date   CHOL 235 (H) 08/13/2018   TRIG 210 (H) 08/13/2018   HDL 41 08/13/2018   CHOLHDL 5.7 08/13/2018   VLDL 42 (H) 08/13/2018   LDLCALC 152 (H) 08/13/2018   LDLCALC UNABLE TO CALCULATE IF TRIGLYCERIDE OVER 400 mg/dL 10/11/2017    Physical Findings: AIMS:  , ,  ,  ,    CIWA:  CIWA-Ar Total: 3 COWS:     Musculoskeletal: Strength & Muscle Tone: within normal limits- no psychomotor agitation, although slightly restless. No tremors, no diaphoresis. Was able to sit comfortably during this session Gait & Station: normal- slightly stooped  Patient leans: N/A  Psychiatric Specialty Exam: Physical Exam  ROS denies headache or chest pain, no shortness of breath, no vomiting, no fever or chills   Blood pressure 115/87, pulse 93, temperature 97.8 F (36.6 C), resp. rate 16, height _0  (1.88 m), weight 76.2 kg, SpO2 99 %.Body mass index is 21.57 kg/m.  General Appearance: Fairly Groomed  Eye Contact:  Good  Speech:  Normal Rate  Volume:  Decreased  Mood:  remains depressed   Affect:  less blunted today, smiles briefly   Thought Process: linear, circumstantial, slowed today  Orientation:  Other:  alert, not particularly distractible or inattentive at this time, confused - partially oriented to place and time  Thought Content:  reports AH, no delusions expressed, currently does not present internally preoccupied   Suicidal Thoughts:  No denies SI/ contracts for safety   Homicidal Thoughts:  No  Memory:  recent and remote fair   Judgement:  Fair  Insight:  Shallow  Psychomotor Activity:  no restlessness or agitation noted today  Concentration:  Concentration: Fair and Attention Span: Fair- some improvement   Recall:  [employer] of Knowledge:  Fair  Language:  Fair  Akathisia:  Negative  Handed:  Right  AIMS (if indicated):     Assets:  Desire for Improvement Resilience  ADL's:  Intact  Cognition:  Impaired,  Moderate  Sleep:  Number of Hours:  1.25   Assessment -  58 year old male, presented to hospital due to depression, suicidal ideations, neuro-vegetative symptoms,  Hallucinations.He has a history of being diagnosed with Schizophrenia in the past and had been admitted for similar presentation last year. Patient reports recent cannabis and cocaine use . Regarding alcohol , states he has been drinking once a week. Admission BAL negative, admission UDS positive for Cocaine and Cannabis.  Patient continues to present with confusion, although as compared to yesterday, has been more easily redirectable, calmer. He reports ongoing depression, AH, but denies SI. His vitals are stable and does not  present tremulous or diaphoretic or in any overt WDL state . His labs are essentially unremarkable . Chart review indicates he had received IM thiamine dosing on admission, and he continues to be on high dose thiamine at this time. Have reviewed with Dr. Jake Samples, colleague, who has also evaluated patient, and it is felt current presentation may be related to decompensated schizophrenia ,and a component of alcohol related dementia also considered in differential. Will continue Haldol management as he has history of good tolerance and as he appeared to decompensate when dose was decreased .      Treatment Plan Summary: Daily contact with patient to assess and evaluate symptoms and progress in treatment, Medication management, Plan inpatient treatment and medications as below  Treatment Plan reviewed as below today 7/10 Encourage milieu participation Encourage efforts to work on sobriety , relapse prevention efforts Continue Ativan detox protocol to address potential alcohol WDL - continue thiamine and folate  Increase Haldol to 10 mgrs QAM and 5 mgrs QHS  for psychosis Continue Cogentin 1 mgr BID to minimize risk of EPS Minipress 1 mgr QHS for insomnia, nightmares  Continue Ativan detox protocol for alcohol WDL as needed and continue thiamine and folate  supplementation  Check RPR as part of cognitive decline workup Treatment team working on disposition planning options  Jenne Campus, MD 08/16/2018, 2:54 PM

## 2018-08-16 NOTE — Progress Notes (Signed)
1:1 DAR Note: D:  Gurjit has been lying in his bed since beginning of the shift.  He appears restless and will attempt to get out of bed.  He is confused, disoriented and unable to answer questions.  VSS.  Sitter remains by side within arms distance.  Sitter reported that he has been grabbing nonexistent things from the air, trying to get out of bed and attempted to urinate in the corner of the room.  He is unable to get up on his own and required significant assistance.  He remains very sedated and would not open his eyes to talk with RN.  Staffed with Patriciaann Clan PA and new order noted for ammonia level in the AM.  He also recommended to hold medications this evening due to sedation and pt unable to follow commands at this time. A:  Sitter remains for safety by his side.  Q 15 minute checks maintained.   R:  Maxmilian remains safe on the unit.  We will continue to monitor the progress towards his goals.

## 2018-08-16 NOTE — Progress Notes (Addendum)
Patient ID: Edwin Henry, male   DOB: September 03, 1960, 58 y.o.   MRN: [id number]  1:1 RN Note  D: Pt has been ambulatory on the unit engaging with RN staff at the nurse's station. Pt is tangential and confused and has been redirected several times to refrain from going into other pts' rooms. Pt is on close observation while awake, per MD order. Pt is currently asleep on his bed with even and unlabored respirations. No signs of distress or discomfort noted.  A: Pt remains on close observation for safety.  R. Pt remains safe on the unit.

## 2018-08-16 NOTE — Progress Notes (Signed)
Pt continues to be confused at times, pt had to be redirected to not go into other patients rooms this evening. Pt understands at times where he is at and what he should be doing , but when he becomes confused he needs constant redirection. Pt may need to be evaluated to possibly be on a 1:1 or close observation at night for patient safety.

## 2018-08-16 NOTE — Progress Notes (Signed)
Pt continues to be confused, not lying down and trying to go to sleep. Pt needs constant redirection . Pt was noted on check sheet  Sleeping 5.25 hrs during the day.

## 2018-08-17 LAB — AMMONIA: Ammonia: 17 umol/L (ref 9–35)

## 2018-08-17 MED ORDER — NICOTINE 14 MG/24HR TD PT24
MEDICATED_PATCH | TRANSDERMAL | Status: AC
  Filled 2018-08-17: qty 1

## 2018-08-17 MED ORDER — NICOTINE 14 MG/24HR TD PT24
14.0000 mg | MEDICATED_PATCH | Freq: Every day | TRANSDERMAL | Status: DC
  Administered 2018-08-17 – 2018-08-21 (×5): 14 mg via TRANSDERMAL
  Filled 2018-08-17 (×7): qty 1

## 2018-08-17 NOTE — Progress Notes (Signed)
   08/17/18 0926  COVID-19 Daily Checkoff  Have you had a fever (temp > 37.80C/100F)  in the past 24 hours?  No  If you have had runny nose, nasal congestion, sneezing in the past 24 hours, has it worsened? No  COVID-19 EXPOSURE  Have you traveled outside the state in the past 14 days? No  Have you been in contact with someone with a confirmed diagnosis of COVID-19 or PUI in the past 14 days without wearing appropriate PPE? No  Have you been living in the same home as a person with confirmed diagnosis of COVID-19 or a PUI (household contact)? No  Have you been diagnosed with COVID-19? No

## 2018-08-17 NOTE — [facility] Group Notes (Signed)
[facility] Group Notes: (Clinical Social Work)   08/17/2018      Type of Therapy:  Group Therapy   Participation Level:  Did Not Attend - was invited both individually by MHT and by overhead announcement, chose not to attend.   Yair Dusza Grossman-Orr, LCSW 08/17/2018, 1:05 PM     
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
## 2018-08-17 NOTE — Progress Notes (Signed)
D:  Edwin Henry is resting quietly with his eyes closed and appears to be asleep.  Breathing even and unlabored.   A:  Sitter remains by side for safety. R:  Kweli remains safe on the unit.

## 2018-08-17 NOTE — Progress Notes (Signed)
D: Patient is depressed, apathetic, isolative. He is isolating to his room and refused to get up for morning medications. He appears depressed and irritable. He did not participate in morning assessment, but denies SI at this time. He does not appear to be responding to internal stimuli. A: Patient checked q15 min, and checks reviewed. Educated patient on importance of attending group therapy sessions and educated on several coping skills. Encouarged participation in milieu through recreation therapy and attending meals with peers. Support and encouragement provided. Fluids offered. R: Patient receptive to education on medications, and is medication compliant. Patient contracts for safety on the unit.

## 2018-08-17 NOTE — Progress Notes (Signed)
Did not attend group 

## 2018-08-17 NOTE — BHH Suicide Risk Assessment (Signed)
[place] INPATIENT:  Family/Significant Other Suicide Prevention Education  Suicide Prevention Education:  Patient Refusal for Family/Significant Other Suicide Prevention Education: The patient Edwin Henry has refused to provide written consent for family/significant other to be provided Family/Significant Other Suicide Prevention Education during admission and/or prior to discharge.  Physician notified.  Berlin Hun Grossman-Orr 08/17/2018, 4:32 PM

## 2018-08-17 NOTE — Progress Notes (Addendum)
[facility]  08/17/2018 4:02 PM Edwin Henry  MRN:  [id number] Subjective: Edwin Henry reported " I am still feeling dizzy, and I am not eating much."  Evaluation:  Edwin Henry observed resting in bed.  Patient was placed on a one-to-one for safety.  Continues to rate his depression 8 out of 10 with 10 being the worst.  Reports auditory and visual hallucinations.  Denies voices are command in nature during this assessment. reports interrupted sleep as he reports taking "cat naps" throughout the day due to racing thoughts.  Denies thoughts of wanting to hurt anybody else however states passive suicidal ideations are chronic in nature.  Reports taking and tolerating medications well.  Continues to report decreased appetite.  Patient was initiated on Ensure.  Patient encouraged to participate within the therapeutic milieu.  Support, encouragement and reassurance was provided.   Principal Problem: MDD (major depressive disorder), recurrent, severe, with psychosis (HCC) Diagnosis: Principal Problem:   MDD (major depressive disorder), recurrent, severe, with psychosis (HCC) Active Problems:   Alcohol use disorder, moderate, dependence (HCC)   Schizoaffective disorder, bipolar type (HCC)  Total Time spent with patient: 20 minutes  Past Psychiatric History:  Past Medical History:  Past Medical History:  Diagnosis Date  . Schizophrenia [facility](HCC)     Past Surgical History:  Procedure Laterality Date  . KNEE SURGERY     Family History: History reviewed. No pertinent family history. Family Psychiatric  History:  Social History:  Social History   Substance and Sexual Activity  Alcohol Use Yes   Comment: 1-2 beers every 2 days     Social History   Substance and Sexual Activity  Drug Use Yes  . Types: Marijuana, Cocaine   Comment: occasional THC use    Social History   Socioeconomic History  . Marital status: Single    Spouse name: Not on file  . Number of children: Not on file  . Years  of education: Not on file  . Highest education level: Not on file  Occupational History  . Not on file  Social Needs  . Financial resource strain: Not on file  . Food insecurity    Worry: Not on file    Inability: Not on file  . Transportation needs    Medical: Not on file    Non-medical: Not on file  Tobacco Use  . Smoking status: Current Every Day Smoker    Packs/day: 0.50  . Smokeless tobacco: Current User    Types: Chew  Substance and Sexual Activity  . Alcohol use: Yes    Comment: 1-2 beers every 2 days  . Drug use: Yes    Types: Marijuana, Cocaine    Comment: occasional THC use  . Sexual activity: Yes    Birth control/protection: Condom  Lifestyle  . Physical activity    Days per week: Not on file    Minutes per session: Not on file  . Stress: Not on file  Relationships  . Social Musicianconnections    Talks on phone: Not on file    Gets together: Not on file    Attends religious service: Not on file    Active member of club or organization: Not on file    Attends meetings of clubs or organizations: Not on file    Relationship status: Not on file  Other Topics Concern  . Not on file  Social History Narrative  . Not on file   Additional Social History:    Pain Medications: see MAR  Prescriptions: see MAR Over the Counter: see MAR History of alcohol / drug use?: Yes Longest period of sobriety (when/how long): none reported Negative Consequences of Use: Personal relationships, Work / Programmer, multimedia, Surveyor, quantity Name of Substance 1: alcohol 1 - Age of First Use: 18 1 - Amount (size/oz): quart of beer 1 - Frequency: weekly 1 - Duration: since onset 1 - Last Use / Amount: today Name of Substance 2: cocaine 2 - Age of First Use: not assessed 2 - Amount (size/oz): $40 2 - Frequency: occasional 2 - Duration: unknown 2 - Last Use / Amount: few days ago Name of Substance 3: Marijuana 3 - Age of First Use: not assessed 3 - Amount (size/oz): 1 gram 3 - Frequency:  occasionally 3 - Duration: unknown 3 - Last Use / Amount: patient states that it has been awhile  Sleep: still fair  Appetite:  Fair  Current Medications: Current Facility-Administered Medications  Medication Dose Route Frequency Provider Last Rate Last Dose  . nicotine (NICODERM CQ - dosed in mg/24 hours) 14 mg/24hr patch           . acetaminophen (TYLENOL) tablet 650 mg  650 mg Oral Q6H PRN Rankin, Shuvon B, NP   650 mg at 08/12/18 2041  . alum & mag hydroxide-simeth (MAALOX/MYLANTA) 200-200-20 MG/5ML suspension 30 mL  30 mL Oral Q4H PRN Rankin, Shuvon B, NP      . benztropine (COGENTIN) tablet 1 mg  1 mg Oral BID Rankin, Shuvon B, NP   1 mg at 08/17/18 1225  . feeding supplement (ENSURE ENLIVE) (ENSURE ENLIVE) liquid 237 mL  237 mL Oral Daily PRN Cherly Beach, DO   237 mL at 08/17/18 1232  . haloperidol (HALDOL) tablet 10 mg  10 mg Oral QHS , Rockey Situ, MD   Stopped at 08/16/18 2306  . haloperidol (HALDOL) tablet 5 mg  5 mg Oral BH-q7a , Rockey Situ, MD   5 mg at 08/17/18 1224  . LORazepam (ATIVAN) tablet 1 mg  1 mg Oral Q6H PRN , Rockey Situ, MD   1 mg at 08/15/18 2257  . LORazepam (ATIVAN) tablet 1 mg  1 mg Oral BID , Rockey Situ, MD   1 mg at 08/17/18 1225   Followed by  . [START ON 08/19/2018] LORazepam (ATIVAN) tablet 1 mg  1 mg Oral Daily ,  A, MD      . magnesium hydroxide (MILK OF MAGNESIA) suspension 30 mL  30 mL Oral Daily PRN Rankin, Shuvon B, NP      . multivitamin with minerals tablet 1 tablet  1 tablet Oral Daily Rankin, Shuvon B, NP   1 tablet at 08/17/18 1224  . nicotine (NICODERM CQ - dosed in mg/24 hours) patch 14 mg  14 mg Transdermal Daily , Rockey Situ, MD   14 mg at 08/17/18 1232  . OLANZapine zydis (ZYPREXA) disintegrating tablet 10 mg  10 mg Oral Q8H PRN , Rockey Situ, MD   10 mg at 08/16/18 1413   And  . ziprasidone (GEODON) injection 20 mg  20 mg Intramuscular PRN ,  A, MD      . prazosin (MINIPRESS)  capsule 1 mg  1 mg Oral QHS , Rockey Situ, MD   Stopped at 08/16/18 2306  . thiamine (VITAMIN B-1) tablet 500 mg  500 mg Oral BID Malvin Johns, MD   500 mg at 08/17/18 1224  . traZODone (DESYREL) tablet 50 mg  50 mg Oral QHS PRN , Rockey Situ, MD  Lab Results:  Results for orders placed or performed during the hospital encounter of 08/12/18 (from the past 48 hour(s))  CK     Status: None   Collection Time: 08/15/18  6:07 PM  Result Value Ref Range   Total CK 55 49 - 397 U/L    Comment: Performed at [facility], [place] [address] [place], [place] [address]  Ammonia     Status: None   Collection Time: 08/15/18  6:07 PM  Result Value Ref Range   Ammonia 33 9 - 35 umol/L    Comment: Performed at [facility], [place] [address] [place], [place] [address]  CBC with Differential/Platelet     Status: Abnormal   Collection Time: 08/15/18  6:07 PM  Result Value Ref Range   WBC 8.2 4.0 - 10.5 K/uL   RBC 5.71 4.22 - 5.81 MIL/uL   Hemoglobin 17.5 (H) 13.0 - 17.0 g/dL   HCT 53.8 (H) 39.0 - 52.0 %   MCV 94.2 80.0 - 100.0 fL   MCH 30.6 26.0 - 34.0 pg   MCHC 32.5 30.0 - 36.0 g/dL   RDW 14.1 11.5 - 15.5 %   Platelets 315 150 - 400 K/uL   nRBC 0.0 0.0 - 0.2 %   Neutrophils Relative % 56 %   Neutro Abs 4.5 1.7 - 7.7 K/uL   Lymphocytes Relative 34 %   Lymphs Abs 2.8 0.7 - 4.0 K/uL   Monocytes Relative 7 %   Monocytes Absolute 0.6 0.1 - 1.0 K/uL   Eosinophils Relative 2 %   Eosinophils Absolute 0.2 0.0 - 0.5 K/uL   Basophils Relative 1 %   Basophils Absolute 0.1 0.0 - 0.1 K/uL   Immature Granulocytes 0 %   Abs Immature Granulocytes 0.02 0.00 - 0.07 K/uL    Comment: Performed at [facility], [place] [address] [place], [place] [address]  Comprehensive metabolic panel     Status: Abnormal   Collection Time: 08/15/18  6:07 PM  Result Value Ref Range   Sodium 136 135 - 145 mmol/L   Potassium 4.3 3.5 - 5.1 mmol/L   Chloride 101 98  - 111 mmol/L   CO2 25 22 - 32 mmol/L   Glucose, Bld 102 (H) 70 - 99 mg/dL   BUN 17 6 - 20 mg/dL   Creatinine, Ser 0.72 0.61 - 1.24 mg/dL   Calcium 9.5 8.9 - 10.3 mg/dL   Total Protein 8.0 6.5 - 8.1 g/dL   Albumin 4.5 3.5 - 5.0 g/dL   AST 18 15 - 41 U/L   ALT 23 0 - 44 U/L   Alkaline Phosphatase 86 38 - 126 U/L   Total Bilirubin 0.2 (L) 0.3 - 1.2 mg/dL   GFR calc non Af Amer >60 >60 mL/min   GFR calc Af Amer >60 >60 mL/min   Anion gap 10 5 - 15    Comment: Performed at [facility], [place] [address], [place], [place] [address]  Vitamin B12     Status: None   Collection Time: 08/15/18  6:07 PM  Result Value Ref Range   Vitamin B-12 534 180 - 914 pg/mL    Comment: (NOTE) This assay is not validated for testing neonatal or myeloproliferative syndrome specimens for Vitamin B12 levels. Performed at [facility], [place] [address], [place], [place] [address]   Ammonia     Status: None   Collection Time: 08/17/18  7:56 AM  Result Value Ref  Range   Ammonia 17 9 - 35 umol/L    Comment: Performed at [facility], [address] [place], [place] [id number]    Blood Alcohol level:  Lab Results  Component Value Date   ETH <10 08/13/2018   ETH 163 (H) 05/22/2018    Metabolic Disorder Labs: Lab Results  Component Value Date   HGBA1C 5.4 08/13/2018   MPG 108.28 08/13/2018   MPG 111.15 10/11/2017   Lab Results  Component Value Date   PROLACTIN 22.1 (H) 10/11/2017   Lab Results  Component Value Date   CHOL 235 (H) 08/13/2018   TRIG 210 (H) 08/13/2018   HDL 41 08/13/2018   CHOLHDL 5.7 08/13/2018   VLDL 42 (H) 08/13/2018   LDLCALC 152 (H) 08/13/2018   LDLCALC UNABLE TO CALCULATE IF TRIGLYCERIDE OVER 400 mg/dL 19/14/782909/06/2017    Physical Findings: AIMS: Facial and Oral Movements Muscles of Facial Expression: None, normal Lips and Perioral Area: None, normal Jaw: None, normal Tongue: None, normal,Extremity Movements Upper  (arms, wrists, hands, fingers): None, normal Lower (legs, knees, ankles, toes): None, normal, Trunk Movements Neck, shoulders, hips: None, normal, Overall Severity Severity of abnormal movements (highest score from questions above): None, normal Incapacitation due to abnormal movements: None, normal Patient's awareness of abnormal movements (rate only patient's report): No Awareness, Dental Status Current problems with teeth and/or dentures?: No Does patient usually wear dentures?: No  CIWA:  CIWA-Ar Total: 7 COWS:     Musculoskeletal: Strength & Muscle Tone: within normal limits- no psychomotor agitation, although slightly restless. No tremors, no diaphoresis. Was able to sit comfortably during this session  Gait & Station: normal- slightly stooped  Patient leans: N/A  Psychiatric Specialty Exam: Physical Exam  Review of Systems  Gastrointestinal: Blood in stool:     Psychiatric/Behavioral: Positive for depression and suicidal ideas. The patient is nervous/anxious and has insomnia.   All other systems reviewed and are negative.    Blood pressure 101/64, pulse 78, temperature 98.7 F (37.1 C), temperature source Oral, resp. rate 16, height 6\' 2"  (1.88 m), weight 76.2 kg, SpO2 99 %.Body mass index is 21.57 kg/m.  General Appearance: Fairly Groomed  Eye Contact:  Good  Speech:  Normal Rate  Volume:  Decreased  Mood:  remains depressed   Affect:  less blunted today, smiles briefly   Thought Process: linear  Orientation:  Full (Time, Place, and Person)  Thought Content:  Hallucinations: Auditory  Suicidal Thoughts:  No passive ideations  Homicidal Thoughts:  No  Memory:  recent and remote fair   Judgement:  Fair  Insight:  Shallow  Psychomotor Activity:  no restlessness or agitation noted today  Concentration:  Concentration: Fair and Attention Span: Fair- some improvement   Recall:  Fis[employer]Fair  Fund of Knowledge:  Fair  Language:  Fair  Akathisia:  Negative  Handed:  Right   AIMS (if indicated):     Assets:  Desire for Improvement Resilience Social Support  ADL's:  Intact  Cognition:  Impaired,  Moderate  Sleep:  Number of Hours: 6     Treatment Plan Summary: Daily contact with patient to assess and evaluate symptoms and progress in treatment and Medication management   Continue with current treatment plan on 08/17/2018 as listed below except were noted  Mood stabilization:  Continue Haldol 5 mg p.o. daily and 10 mg p.o. nightly CIWA protocol-alcohol detox Continue Minipress 1 mg p.o. nightly  See chart for agitation protocol CSW to continue working on discharge disposition  Patient encouraged to participate within the therapeutic milieu  Oneta Rackanika N Lewis, NP 08/17/2018, 4:02 PM   Attest to NP Progress Note

## 2018-08-17 NOTE — Progress Notes (Addendum)
D:  Edwin Henry is resting quietly with his eyes closed.  Breathing even and unlabored.  He appears to be in no physical distress.  Sitter remains by his side. A:  1:1 sitter remains for safety.   R:  Edwin Henry remains safe on the unit.

## 2018-08-18 LAB — RPR: RPR Ser Ql: NONREACTIVE

## 2018-08-18 LAB — VITAMIN B1: Vitamin B1 (Thiamine): 231.3 nmol/L — ABNORMAL HIGH (ref 66.5–200.0)

## 2018-08-18 MED ORDER — MIRTAZAPINE 15 MG PO TABS
15.0000 mg | ORAL_TABLET | Freq: Every day | ORAL | Status: DC
  Administered 2018-08-18 – 2018-08-19 (×2): 15 mg via ORAL
  Filled 2018-08-18 (×3): qty 1

## 2018-08-18 MED ORDER — TRAZODONE HCL 100 MG PO TABS: 100.0000 mg | ORAL_TABLET | Freq: Every evening | ORAL | Status: DC | PRN

## 2018-08-18 MED ORDER — HALOPERIDOL 5 MG PO TABS
5.0000 mg | ORAL_TABLET | Freq: Two times a day (BID) | ORAL | Status: DC
  Administered 2018-08-18 – 2018-08-21 (×6): 5 mg via ORAL
  Filled 2018-08-18 (×10): qty 1

## 2018-08-18 NOTE — Progress Notes (Signed)
D: Patient appears depressed, fatigued, hopeless. He continues to endorse SI with ruminations throughout the night last night "that kept me up all night." He reportedly did not sleep at all last night, and is requesting medication in the morning to help with sleep. Educated that we cannot provide medication during daytime hours. He is medication compliant. He continues to isolate to room despite encouragement to come out into the milieu. He is still hearing voices and seeing shadows. His affect is flat.  A: Provided support and encouragement. Encouraged participation in milieu. Offered fluids. Educated on medications. R: Patient returned to room and laying in bed.

## 2018-08-18 NOTE — Progress Notes (Signed)
D: Pt passive SI/ AVH. Pt is pleasant and cooperative. Pt stated he was feeling better. Pt stated he was still seeing / hearing things and knows they aren't real, but does not know what to do about them coming.  A: Pt was offered support and encouragement. Pt was given scheduled medications. Pt was encourage to attend groups. Q 15 minute checks were done for safety.  R:Pt attends groups and interacts well with peers and staff. Pt is taking medication. Pt has no complaints.Pt receptive to treatment and safety maintained on unit.  Problem: Education: Goal: Emotional status will improve Outcome: Progressing

## 2018-08-18 NOTE — Progress Notes (Signed)
   08/18/18 0829  COVID-19 Daily Checkoff  Have you had a fever (temp > 37.80C/100F)  in the past 24 hours?  No  If you have had runny nose, nasal congestion, sneezing in the past 24 hours, has it worsened? No  COVID-19 EXPOSURE  Have you traveled outside the state in the past 14 days? No  Have you been in contact with someone with a confirmed diagnosis of COVID-19 or PUI in the past 14 days without wearing appropriate PPE? No  Have you been living in the same home as a person with confirmed diagnosis of COVID-19 or a PUI (household contact)? No  Have you been diagnosed with COVID-19? No

## 2018-08-18 NOTE — [facility] Group Notes (Signed)
[facility] LCSW Group Therapy Note  Date/Time:  08/18/2018  11:00AM-12:00PM  Type of Therapy and Topic:  Group Therapy:  Music and Mood  Participation Level:  Active   Description of Group: In this process group, members listened to a variety of genres of music and identified that different types of music evoke different responses.  Patients were encouraged to identify music that was soothing for them and music that was energizing for them.  Patients discussed how this knowledge can help with wellness and recovery in various ways including managing depression and anxiety as well as encouraging healthy sleep habits.    Therapeutic Goals: 1. Patients will explore the impact of different varieties of music on mood 2. Patients will verbalize the thoughts they have when listening to different types of music 3. Patients will identify music that is soothing to them as well as music that is energizing to them 4. Patients will discuss how to use this knowledge to assist in maintaining wellness and recovery 5. Patients will explore the use of music as a coping skill  Summary of Patient Progress:  At the beginning of group, patient expressed that he felt "not too good" because he cannot sleep.  At first he said the songs didn't make him feel anything, but eventually he started saying that the songs were encouraging and uplifting.  He did not remain for the entire group, however.  He did return just as group was ending.  Therapeutic Modalities: Solution Focused Brief Therapy Activity   Selmer Dominion, LCSW

## 2018-08-18 NOTE — Progress Notes (Signed)
D: Patient observed quietly eating lunch in his room. He is sitting upright. His tremor is mild. He still is occasionally confused and reporting hallucinations. He continues to have suicidal thoughts without a plan. A: 1:1 discontinued.

## 2018-08-18 NOTE — Progress Notes (Signed)
1:1 Note Pt has been resting well with even and unlabored respiration. Morning med and breakfast will be given a scheduled. Pt remains on 1:1 for safety, will continue to monitor.

## 2018-08-18 NOTE — Progress Notes (Signed)
D: Patient in milieu. Pacing between dayroom and his room. He is calm and cooperative. He is interacting well with his peers. He appears less depressed. A: 1:1 discontinued still R: Patient verbalizes no concerns.

## 2018-08-18 NOTE — Progress Notes (Signed)
D: Provider T. Lewis, NP discontinued 1:1 observation. Patient presents less confused with improved gait. He did not sleep well last night, but does not appear unsteady. His gait is still shuffling, slow. His vitals are stable. Will monitor closely for worsening symptoms.  A: Discontinued 1:1 observation. R: Patient c/o not being able to sleep last night, concerned he had to switch rooms (which he does not). Reuests sleep medication. Instructed he can receive it tonight.

## 2018-08-18 NOTE — Progress Notes (Signed)
D: Patient complains that he did not get lunch. When reminded he had eaten lunch, he states, "My mind plays tricks on me." He was given a sandwich tray to supplement his lunch, as well as several snacks.  A: 1:1 discontinued R: Patient continues to do well off 1:1.

## 2018-08-18 NOTE — Progress Notes (Signed)
1:1 Note  Pt is currently in bed a sleep. Pf has been observed tossing and turning in bed. Pt is not under distress, respiration is even and unlabored. Remainns on 1:1 for safety, will continue to monitor.

## 2018-08-18 NOTE — Progress Notes (Signed)
D: Patient appears depressed, isolating to room and awake in bed. His affect is flat.  A: Will monitor for increasing symptoms of suicidality or impulsivity. R: Patient requesting medication for sleep. This has already been added to his bedtime regimen.

## 2018-08-18 NOTE — Progress Notes (Addendum)
1:1 Note  Patient presents with anxious affect and depressed mood,  Pt complained of hearing voices and has been observed responding to internal stimuli. Reports passive SI with no plan. Denies pain and visual hallucinations.  Maintained on 1;1 observation for safety.  Medications given as prescribed.  Support and encouragement offered as needed.  Will continue to monitor.

## 2018-08-18 NOTE — Progress Notes (Addendum)
[facility] MD Progress Note  08/18/2018 9:18 AM Edwin Henry  MRN:  [id number]   Subjective: Edwin Henry reported "  I am still hearing voice all day and night."  Evaluation:  Aneesh was placed on 1:1 for fall risk precaution. Continues to endorse auditory hallucinations that are worsened at night.  Patient is requesting something stronger to aid with his sleep.  Reports diminished appetite as he states that ensure supplements is the only thing that he is drinking throughout the day.  Patient encouraged to participate within the therapeutic milieu.  Discussed titration to Haldol 5 mg daily to twice daily.  Will initiate Remeron 15 mg p.o. nightly.  Patient was receptive to plan.  Will discontinue one-to-one.  Patient encouraged to contact staff before getting out of bed. RPR ( non reactive)  and Amnion 17 within therapeutic range.  Rates his depression 8 out of 10 with 10 being the worst.  Encouraged sleeping hygiene regimen.  Staying awake during the day.  Patient appeared receptive to plan. Support, encouragement  And reassurance was provided.   Principal Problem: MDD (major depressive disorder), recurrent, severe, with psychosis ([place]) Diagnosis: Principal Problem:   MDD (major depressive disorder), recurrent, severe, with psychosis ([place]) Active Problems:   Alcohol use disorder, moderate, dependence ([place])   Schizoaffective disorder, bipolar type ([place])  Total Time spent with patient: 20 minutes  Past Psychiatric History:  Past Medical History:  Past Medical History:  Diagnosis Date  . Schizophrenia [facility])     Past Surgical History:  Procedure Laterality Date  . KNEE SURGERY     Family History: History reviewed. No pertinent family history. Family Psychiatric  History:  Social History:  Social History   Substance and Sexual Activity  Alcohol Use Yes   Comment: 1-2 beers every 2 days     Social History   Substance and Sexual Activity  Drug Use Yes  . Types: Marijuana, Cocaine   Comment:  occasional THC use    Social History   Socioeconomic History  . Marital status: Single    Spouse name: Not on file  . Number of children: Not on file  . Years of education: Not on file  . Highest education level: Not on file  Occupational History  . Not on file  Social Needs  . Financial resource strain: Not on file  . Food insecurity    Worry: Not on file    Inability: Not on file  . Transportation needs    Medical: Not on file    Non-medical: Not on file  Tobacco Use  . Smoking status: Current Every Day Smoker    Packs/day: 0.50  . Smokeless tobacco: Current User    Types: Chew  Substance and Sexual Activity  . Alcohol use: Yes    Comment: 1-2 beers every 2 days  . Drug use: Yes    Types: Marijuana, Cocaine    Comment: occasional THC use  . Sexual activity: Yes    Birth control/protection: Condom  Lifestyle  . Physical activity    Days per week: Not on file    Minutes per session: Not on file  . Stress: Not on file  Relationships  . Social Herbalist on phone: Not on file    Gets together: Not on file    Attends religious service: Not on file    Active member of club or organization: Not on file    Attends meetings of clubs or organizations: Not on file  Relationship status: Not on file  Other Topics Concern  . Not on file  Social History Narrative  . Not on file   Additional Social History:    Pain Medications: see MAR Prescriptions: see MAR Over the Counter: see MAR History of alcohol / drug use?: Yes Longest period of sobriety (when/how long): none reported Negative Consequences of Use: Personal relationships, Work / Programmer, multimediachool, Surveyor, quantityinancial Name of Substance 1: alcohol 1 - Age of First Use: 18 1 - Amount (size/oz): quart of beer 1 - Frequency: weekly 1 - Duration: since onset 1 - Last Use / Amount: today Name of Substance 2: cocaine 2 - Age of First Use: not assessed 2 - Amount (size/oz): $40 2 - Frequency: occasional 2 - Duration:  unknown 2 - Last Use / Amount: few days ago Name of Substance 3: Marijuana 3 - Age of First Use: not assessed 3 - Amount (size/oz): 1 gram 3 - Frequency: occasionally 3 - Duration: unknown 3 - Last Use / Amount: patient states that it has been awhile  Sleep: still fair  Appetite:  Fair  Current Medications: Current Facility-Administered Medications  Medication Dose Route Frequency Provider Last Rate Last Dose  . acetaminophen (TYLENOL) tablet 650 mg  650 mg Oral Q6H PRN Rankin, Shuvon B, NP   650 mg at 08/17/18 1829  . alum & mag hydroxide-simeth (MAALOX/MYLANTA) 200-200-20 MG/5ML suspension 30 mL  30 mL Oral Q4H PRN Rankin, Shuvon B, NP      . benztropine (COGENTIN) tablet 1 mg  1 mg Oral BID Rankin, Shuvon B, NP   1 mg at 08/18/18 0829  . feeding supplement (ENSURE ENLIVE) (ENSURE ENLIVE) liquid 237 mL  237 mL Oral Daily PRN Cherly Beachorman, Jacqueline J, DO   237 mL at 08/18/18 16100832  . haloperidol (HALDOL) tablet 10 mg  10 mg Oral QHS Cobos, Rockey SituFernando A, MD   10 mg at 08/17/18 2125  . haloperidol (HALDOL) tablet 5 mg  5 mg Oral BID Oneta RackLewis, Tanika N, NP      . LORazepam (ATIVAN) tablet 1 mg  1 mg Oral Q6H PRN Cobos, Rockey SituFernando A, MD   1 mg at 08/15/18 2257  . magnesium hydroxide (MILK OF MAGNESIA) suspension 30 mL  30 mL Oral Daily PRN Rankin, Shuvon B, NP      . mirtazapine (REMERON) tablet 15 mg  15 mg Oral QHS Oneta RackLewis, Tanika N, NP      . multivitamin with minerals tablet 1 tablet  1 tablet Oral Daily Rankin, Shuvon B, NP   1 tablet at 08/18/18 0829  . nicotine (NICODERM CQ - dosed in mg/24 hours) patch 14 mg  14 mg Transdermal Daily Cobos, Rockey SituFernando A, MD   14 mg at 08/18/18 0829  . OLANZapine zydis (ZYPREXA) disintegrating tablet 10 mg  10 mg Oral Q8H PRN Cobos, Rockey SituFernando A, MD   10 mg at 08/18/18 0207   And  . ziprasidone (GEODON) injection 20 mg  20 mg Intramuscular PRN Cobos, Rockey SituFernando A, MD      . prazosin (MINIPRESS) capsule 1 mg  1 mg Oral QHS Cobos, Rockey SituFernando A, MD   1 mg at 08/17/18 2124     Lab Results:  Results for orders placed or performed during the hospital encounter of 08/12/18 (from the past 48 hour(s))  RPR     Status: None   Collection Time: 08/17/18  7:56 AM  Result Value Ref Range   RPR Ser Ql Non Reactive Non Reactive    Comment: (NOTE) Performed  At: [facility] [address] [place], [place] [id number] Jolene SchimkeNagendra Sanjai MD FI:[id number]:[phone]   Ammonia     Status: None   Collection Time: 08/17/18  7:56 AM  Result Value Ref Range   Ammonia 17 9 - 35 umol/L    Comment: Performed at [facility], [address] [place], [place] [id number]    Blood Alcohol level:  Lab Results  Component Value Date   ETH <10 08/13/2018   ETH 163 (H) 05/22/2018    Metabolic Disorder Labs: Lab Results  Component Value Date   HGBA1C 5.4 08/13/2018   MPG 108.28 08/13/2018   MPG 111.15 10/11/2017   Lab Results  Component Value Date   PROLACTIN 22.1 (H) 10/11/2017   Lab Results  Component Value Date   CHOL 235 (H) 08/13/2018   TRIG 210 (H) 08/13/2018   HDL 41 08/13/2018   CHOLHDL 5.7 08/13/2018   VLDL 42 (H) 08/13/2018   LDLCALC 152 (H) 08/13/2018   LDLCALC UNABLE TO CALCULATE IF TRIGLYCERIDE OVER 400 mg/dL 30/16/010909/06/2017    Physical Findings: AIMS: Facial and Oral Movements Muscles of Facial Expression: None, normal Lips and Perioral Area: None, normal Jaw: None, normal Tongue: None, normal,Extremity Movements Upper (arms, wrists, hands, fingers): Mild Lower (legs, knees, ankles, toes): None, normal, Trunk Movements Neck, shoulders, hips: None, normal, Overall Severity Severity of abnormal movements (highest score from questions above): None, normal Incapacitation due to abnormal movements: None, normal Patient's awareness of abnormal movements (rate only patient's report): No Awareness, Dental Status Current problems with teeth and/or dentures?: No Does patient usually wear dentures?: No  CIWA:  CIWA-Ar Total: 4 COWS:  COWS Total  Score: 0  Musculoskeletal: Strength & Muscle Tone: within normal limits Gait & Station: normal Patient leans: N/A  Psychiatric Specialty Exam: Physical Exam  Review of Systems  Gastrointestinal: Blood in stool:     Psychiatric/Behavioral: Positive for depression and suicidal ideas. The patient is nervous/anxious and has insomnia.   All other systems reviewed and are negative.    Blood pressure 114/78, pulse 78, temperature 98.7 F (37.1 C), temperature source Oral, resp. rate 16, height 6\' 2"  (1.88 m), weight 76.2 kg, SpO2 99 %.Body mass index is 21.57 kg/m.  General Appearance: Fairly Groomed  Eye Contact:  Good  Speech:  Normal Rate  Volume:  Decreased  Mood:  remains depressed   Affect:  Congruent  Thought Process: linear  Orientation:  Full (Time, Place, and Person)  Thought Content:  Hallucinations: Auditory  Suicidal Thoughts:  No passive ideations  Homicidal Thoughts:  No  Memory:  recent and remote fair   Judgement:  Fair  Insight:  Shallow  Psychomotor Activity:  no restlessness or agitation noted today  Concentration:  Concentration: Fair and Attention Span: Fair- some improvement   Recall:  Fis[employer]Fair  Fund of Knowledge:  Fair  Language:  Fair  Akathisia:  Negative  Handed:  Right  AIMS (if indicated):     Assets:  Desire for Improvement Resilience Social Support  ADL's:  Intact  Cognition:  Impaired,  Moderate  Sleep:  Number of Hours: 6.75     Treatment Plan Summary: Daily contact with patient to assess and evaluate symptoms and progress in treatment and Medication management   Continue with current treatment plan on 08/18/2018 as listed below except were noted  Mood stabilization:  Increased Haldol 5 mg p.o. daily  To BID and 10 mg p.o. nightly CIWA protocol-alcohol detox Continue Minipress 1 mg p.o. nightly Initiated Remeron  15 mg p.o. nightly  See chart for agitation protocol CSW to continue working on discharge disposition Patient encouraged  to participate within the therapeutic milieu  Oneta Rackanika N Lewis, NP 08/18/2018, 9:18 AM   Attest to NP Progress Note

## 2018-08-19 MED ORDER — TEMAZEPAM 15 MG PO CAPS
15.0000 mg | ORAL_CAPSULE | Freq: Once | ORAL | Status: AC
  Administered 2018-08-19: 15 mg via ORAL
  Filled 2018-08-19: qty 1

## 2018-08-19 MED ORDER — LORAZEPAM 1 MG PO TABS
ORAL_TABLET | ORAL | Status: AC
  Filled 2018-08-19: qty 1

## 2018-08-19 NOTE — Progress Notes (Signed)
Pt A & O x 3. Pt expressed increased AH telling him to kill himself. Pt denies SI/HI and verbally contracts for safety. Pt compliant with taking meds and denies any side effects.  Orders reviewed with pt. Verbal support provided. Pt encouraged to attend groups. 15 minute checks performed for safety.   Pt compliant with tx plan.

## 2018-08-19 NOTE — Tx Team (Signed)
Interdisciplinary Treatment and Diagnostic Plan Update  08/19/2018 Time of Session: 09:06am Edwin HackerBilly Henry MRN: [id number]  Principal Diagnosis: MDD (major depressive disorder), recurrent, severe, with psychosis ([facility])  Secondary Diagnoses: Principal Problem:   MDD (major depressive disorder), recurrent, severe, with psychosis ([facility]) Active Problems:   Alcohol use disorder, moderate, dependence ([facility])   Schizoaffective disorder, bipolar type ([facility])   Current Medications:  Current Facility-Administered Medications  Medication Dose Route Frequency Provider Last Rate Last Dose  . acetaminophen (TYLENOL) tablet 650 mg  650 mg Oral Q6H PRN Rankin, Shuvon B, NP   650 mg at 08/17/18 1829  . alum & mag hydroxide-simeth (MAALOX/MYLANTA) 200-200-20 MG/5ML suspension 30 mL  30 mL Oral Q4H PRN Rankin, Shuvon B, NP      . benztropine (COGENTIN) tablet 1 mg  1 mg Oral BID Rankin, Shuvon B, NP   1 mg at 08/19/18 0803  . feeding supplement (ENSURE ENLIVE) (ENSURE ENLIVE) liquid 237 mL  237 mL Oral Daily PRN Cherly Beachorman, Jacqueline J, DO   237 mL at 08/18/18 1658  . haloperidol (HALDOL) tablet 10 mg  10 mg Oral QHS Cobos, Rockey SituFernando A, MD   10 mg at 08/18/18 2103  . haloperidol (HALDOL) tablet 5 mg  5 mg Oral BID Oneta RackLewis, Tanika N, NP   5 mg at 08/19/18 0803  . magnesium hydroxide (MILK OF MAGNESIA) suspension 30 mL  30 mL Oral Daily PRN Rankin, Shuvon B, NP      . mirtazapine (REMERON) tablet 15 mg  15 mg Oral QHS Oneta RackLewis, Tanika N, NP   15 mg at 08/18/18 2102  . multivitamin with minerals tablet 1 tablet  1 tablet Oral Daily Rankin, Shuvon B, NP   1 tablet at 08/19/18 0803  . nicotine (NICODERM CQ - dosed in mg/24 hours) patch 14 mg  14 mg Transdermal Daily Cobos, Rockey SituFernando A, MD   14 mg at 08/19/18 0803  . OLANZapine zydis (ZYPREXA) disintegrating tablet 10 mg  10 mg Oral Q8H PRN Cobos, Rockey SituFernando A, MD   10 mg at 08/18/18 2349   And  . ziprasidone (GEODON) injection 20 mg  20 mg Intramuscular PRN Cobos, Rockey SituFernando A, MD       . prazosin (MINIPRESS) capsule 1 mg  1 mg Oral QHS Cobos, Rockey SituFernando A, MD   1 mg at 08/18/18 2103   PTA Medications: Medications Prior to Admission  Medication Sig Dispense Refill Last Dose  . benztropine (COGENTIN) 1 MG tablet Take 1 tablet (1 mg total) by mouth 2 (two) times daily. 60 tablet 0   . FLUoxetine (PROZAC) 20 MG capsule Take 20 mg by mouth at bedtime.     . haloperidol (HALDOL) 10 MG tablet Take 1 tablet (10 mg total) by mouth at bedtime. 30 tablet 0   . haloperidol (HALDOL) 5 MG tablet Take 1 tablet (5 mg total) by mouth every morning. 30 tablet 0   . hydrOXYzine (ATARAX/VISTARIL) 50 MG tablet Take 1 tablet (50 mg total) by mouth every 6 (six) hours as needed for anxiety. 30 tablet 0   . prazosin (MINIPRESS) 5 MG capsule Take 5 mg by mouth at bedtime.     . traZODone (DESYREL) 50 MG tablet Take 1 tablet (50 mg total) by mouth at bedtime and may repeat dose one time if needed. 30 tablet 0     Patient Stressors: Financial difficulties Medication change or noncompliance  Patient Strengths: Ability for insight Wellsite geologistCommunication skills General fund of knowledge Motivation for treatment/growth  Treatment Modalities: Medication Management,  Group therapy, Case management,  1 to 1 session with clinician, Psychoeducation, Recreational therapy.   Physician Treatment Plan for Primary Diagnosis: MDD (major depressive disorder), recurrent, severe, with psychosis (H[facility]) Long Term Goal(s): Improvement in symptoms so as ready for discharge Improvement in symptoms so as ready for discharge   Short Term Goals: Ability to identify changes in lifestyle to reduce recurrence of condition will improve Ability to verbalize feelings will improve Ability to disclose and discuss suicidal ideas Ability to demonstrate self-control will improve Ability to identify and develop effective coping behaviors will improve  Medication Management: Evaluate patient's response, side effects, and tolerance of  medication regimen.  Therapeutic Interventions: 1 to 1 sessions, Unit Group sessions and Medication administration.  Evaluation of Outcomes: Progressing  Physician Treatment Plan for Secondary Diagnosis: Principal Problem:   MDD (major depressive disorder), recurrent, severe, with psychosis ([facility]) Active Problems:   Alcohol use disorder, moderate, dependence (H[facility])   Schizoaffective disorder, bipolar type (H[facility])  Long Term Goal(s): Improvement in symptoms so as ready for discharge Improvement in symptoms so as ready for discharge   Short Term Goals: Ability to identify changes in lifestyle to reduce recurrence of condition will improve Ability to verbalize feelings will improve Ability to disclose and discuss suicidal ideas Ability to demonstrate self-control will improve Ability to identify and develop effective coping behaviors will improve     Medication Management: Evaluate patient's response, side effects, and tolerance of medication regimen.  Therapeutic Interventions: 1 to 1 sessions, Unit Group sessions and Medication administration.  Evaluation of Outcomes: Progressing   RN Treatment Plan for Primary Diagnosis: MDD (major depressive disorder), recurrent, severe, with psychosis (HCC) Long Term Goal(s): Knowledge of disease and therapeutic regimen to maintain health will improve  Short Term Goals: Ability to participate in decision making will improve, Ability to verbalize feelings will improve, Ability to disclose and discuss suicidal ideas, Ability to identify and develop effective coping behaviors will improve and Compliance with prescribed medications will improve  Medication Management: RN will administer medications as ordered by provider, will assess and evaluate patient's response and provide education to patient for prescribed medication. RN will report any adverse and/or side effects to prescribing provider.  Therapeutic Interventions: 1 on 1 counseling sessions,  Psychoeducation, Medication administration, Evaluate responses to treatment, Monitor vital signs and CBGs as ordered, Perform/monitor CIWA, COWS, AIMS and Fall Risk screenings as ordered, Perform wound care treatments as ordered.  Evaluation of Outcomes: Progressing   LCSW Treatment Plan for Primary Diagnosis: MDD (major depressive disorder), recurrent, severe, with psychosis (HCC) Long Term Goal(s): Safe transition to appropriate next level of care at discharge, Engage patient in therapeutic group addressing interpersonal concerns.  Short Term Goals: Engage patient in aftercare planning with referrals and resources and Increase skills for wellness and recovery  Therapeutic Interventions: Assess for all discharge needs, 1 to 1 time with Social worker, Explore available resources and support systems, Assess for adequacy in community support network, Educate family and significant other(s) on suicide prevention, Complete Psychosocial Assessment, Interpersonal group therapy.  Evaluation of Outcomes: Progressing   Progress in Treatment: Attending groups: Yes. Participating in groups: Yes. Taking medication as prescribed: Yes. Toleration medication: Yes. Family/Significant other contact made: Yes, individual(s) contacted:  with pt Patient understands diagnosis: Yes. Discussing patient identified problems/goals with staff: Yes. Medical problems stabilized or resolved: Yes. Denies suicidal/homicidal ideation: Yes. Issues/concerns per patient self-inventory: No. Other:   New problem(s) identified: No, Describe:  None  New Short Term/Long Term Goal(s): Medication stabilization, elimination  of SI thoughts, and development of a comprehensive mental wellness plan.   Patient Goals:    Discharge Plan or Barriers: CSW will continue to follow up for appropriate referrals and possible discharge planning  Reason for Continuation of Hospitalization: Hallucinations Medication  stabilization  Estimated Length of Stay: 1-2 days  Attendees: Patient: 08/19/2018   Physician: Dr. Johnn Hai, MD 08/19/2018  Nursing: Sharl Ma, RN  08/19/2018   RN Care Manager: 08/19/2018  Social Worker: Ardelle Anton, LCSW 08/19/2018   Recreational Therapist:  08/19/2018   Other:  08/19/2018   Other:  08/19/2018   Other: 08/19/2018      Scribe for Treatment Team: Trecia Rogers, LCSW 08/19/2018 10:04 AM

## 2018-08-19 NOTE — Progress Notes (Signed)
[facility]  08/19/2018 10:50 AM Merryl HackerBilly Abello  MRN:  [id number] Subjective:   Patient up and about alert oriented to person place day and situation not exact date so he has had a resolution in the memory deficits and confusional state that we thought but Wernicke's encephalopathy he did receive high-dose thiamine in addition to other B vitamins.  He now states he has nowhere to go any as voices "inside his head" telling him to kill himself and he wants to stay here 2 more days to find housing.  Again what was a confusional state has resolved now replaced with issues of secondary gain. Principal Problem: MDD (major depressive disorder), recurrent, severe, with psychosis (HCC) Diagnosis: Principal Problem:   MDD (major depressive disorder), recurrent, severe, with psychosis (HCC) Active Problems:   Alcohol use disorder, moderate, dependence (HCC)   Schizoaffective disorder, bipolar type (HCC)  Total Time spent with patient: 20 minutes   Past Medical History:  Past Medical History:  Diagnosis Date  . Schizophrenia [facility](HCC)     Past Surgical History:  Procedure Laterality Date  . KNEE SURGERY     Family History: History reviewed. No pertinent family history.  Social History:  Social History   Substance and Sexual Activity  Alcohol Use Yes   Comment: 1-2 beers every 2 days     Social History   Substance and Sexual Activity  Drug Use Yes  . Types: Marijuana, Cocaine   Comment: occasional THC use    Social History   Socioeconomic History  . Marital status: Single    Spouse name: Not on file  . Number of children: Not on file  . Years of education: Not on file  . Highest education level: Not on file  Occupational History  . Not on file  Social Needs  . Financial resource strain: Not on file  . Food insecurity    Worry: Not on file    Inability: Not on file  . Transportation needs    Medical: Not on file    Non-medical: Not on file  Tobacco Use  . Smoking  status: Current Every Day Smoker    Packs/day: 0.50  . Smokeless tobacco: Current User    Types: Chew  Substance and Sexual Activity  . Alcohol use: Yes    Comment: 1-2 beers every 2 days  . Drug use: Yes    Types: Marijuana, Cocaine    Comment: occasional THC use  . Sexual activity: Yes    Birth control/protection: Condom  Lifestyle  . Physical activity    Days per week: Not on file    Minutes per session: Not on file  . Stress: Not on file  Relationships  . Social Musicianconnections    Talks on phone: Not on file    Gets together: Not on file    Attends religious service: Not on file    Active member of club or organization: Not on file    Attends meetings of clubs or organizations: Not on file    Relationship status: Not on file  Other Topics Concern  . Not on file  Social History Narrative  . Not on file   Additional Social History:    Pain Medications: see MAR Prescriptions: see MAR Over the Counter: see MAR History of alcohol / drug use?: Yes Longest period of sobriety (when/how long): none reported Negative Consequences of Use: Personal relationships, Work / Programmer, multimediachool, Surveyor, quantityinancial Name of Substance 1: alcohol 1 - Age of First Use: 18  1 - Amount (size/oz): quart of beer 1 - Frequency: weekly 1 - Duration: since onset 1 - Last Use / Amount: today Name of Substance 2: cocaine 2 - Age of First Use: not assessed 2 - Amount (size/oz): $40 2 - Frequency: occasional 2 - Duration: unknown 2 - Last Use / Amount: few days ago Name of Substance 3: Marijuana 3 - Age of First Use: not assessed 3 - Amount (size/oz): 1 gram 3 - Frequency: occasionally 3 - Duration: unknown 3 - Last Use / Amount: patient states that it has been awhile              Sleep: Good  Appetite:  Good  Current Medications: Current Facility-Administered Medications  Medication Dose Route Frequency Provider Last Rate Last Dose  . acetaminophen (TYLENOL) tablet 650 mg  650 mg Oral Q6H PRN Rankin,  Shuvon B, NP   650 mg at 08/17/18 1829  . alum & mag hydroxide-simeth (MAALOX/MYLANTA) 200-200-20 MG/5ML suspension 30 mL  30 mL Oral Q4H PRN Rankin, Shuvon B, NP      . benztropine (COGENTIN) tablet 1 mg  1 mg Oral BID Rankin, Shuvon B, NP   1 mg at 08/19/18 0803  . feeding supplement (ENSURE ENLIVE) (ENSURE ENLIVE) liquid 237 mL  237 mL Oral Daily PRN Cherly Beachorman, Jacqueline J, DO   237 mL at 08/18/18 1658  . haloperidol (HALDOL) tablet 10 mg  10 mg Oral QHS Cobos, Rockey SituFernando A, MD   10 mg at 08/18/18 2103  . haloperidol (HALDOL) tablet 5 mg  5 mg Oral BID Oneta RackLewis, Tanika N, NP   5 mg at 08/19/18 0803  . magnesium hydroxide (MILK OF MAGNESIA) suspension 30 mL  30 mL Oral Daily PRN Rankin, Shuvon B, NP      . mirtazapine (REMERON) tablet 15 mg  15 mg Oral QHS Oneta RackLewis, Tanika N, NP   15 mg at 08/18/18 2102  . multivitamin with minerals tablet 1 tablet  1 tablet Oral Daily Rankin, Shuvon B, NP   1 tablet at 08/19/18 0803  . nicotine (NICODERM CQ - dosed in mg/24 hours) patch 14 mg  14 mg Transdermal Daily Cobos, Rockey SituFernando A, MD   14 mg at 08/19/18 0803  . OLANZapine zydis (ZYPREXA) disintegrating tablet 10 mg  10 mg Oral Q8H PRN Cobos, Rockey SituFernando A, MD   10 mg at 08/18/18 2349   And  . ziprasidone (GEODON) injection 20 mg  20 mg Intramuscular PRN Cobos, Fernando A, MD      . prazosin (MINIPRESS) capsule 1 mg  1 mg Oral QHS Cobos, Rockey SituFernando A, MD   1 mg at 08/18/18 2103    Lab Results: No results found for this or any previous visit (from the past 48 hour(s)).  Blood Alcohol level:  Lab Results  Component Value Date   ETH <10 08/13/2018   ETH 163 (H) 05/22/2018    Metabolic Disorder Labs: Lab Results  Component Value Date   HGBA1C 5.4 08/13/2018   MPG 108.28 08/13/2018   MPG 111.15 10/11/2017   Lab Results  Component Value Date   PROLACTIN 22.1 (H) 10/11/2017   Lab Results  Component Value Date   CHOL 235 (H) 08/13/2018   TRIG 210 (H) 08/13/2018   HDL 41 08/13/2018   CHOLHDL 5.7 08/13/2018    VLDL 42 (H) 08/13/2018   LDLCALC 152 (H) 08/13/2018   LDLCALC UNABLE TO CALCULATE IF TRIGLYCERIDE OVER 400 mg/dL 16/10/960409/06/2017    Physical Findings: AIMS: Facial and Oral Movements  Muscles of Facial Expression: None, normal Lips and Perioral Area: None, normal Jaw: None, normal Tongue: None, normal,Extremity Movements Upper (arms, wrists, hands, fingers): Mild Lower (legs, knees, ankles, toes): None, normal, Trunk Movements Neck, shoulders, hips: None, normal, Overall Severity Severity of abnormal movements (highest score from questions above): None, normal Incapacitation due to abnormal movements: None, normal Patient's awareness of abnormal movements (rate only patient's report): No Awareness, Dental Status Current problems with teeth and/or dentures?: No Does patient usually wear dentures?: No  CIWA:  CIWA-Ar Total: 5 COWS:  COWS Total Score: 0  Musculoskeletal: Strength & Muscle Tone: within normal limits Gait & Station: normal Patient leans: N/A  Psychiatric Specialty Exam: Physical Exam  ROS  Blood pressure 122/78, pulse (!) 109, temperature 98.3 F (36.8 C), temperature source Oral, resp. rate 16, height 6\' 2"  (1.88 m), weight 76.2 kg, SpO2 96 %.Body mass index is 21.57 kg/m.  General Appearance: Casual  Eye Contact:  Good  Speech:  Clear and Coherent  Volume:  Normal  Mood:  Dysphoric  Affect:  Appropriate  Thought Process:  Linear and Descriptions of Associations: Tangential  Orientation:  Full (Time, Place, and Person)  Thought Content:  Logical  Suicidal Thoughts: Reports intrusive thoughts of this content can contract for safety here  Homicidal Thoughts:  No  Memory: 3/3 1/3  Judgement:  Fair  Insight:  Fair  Psychomotor Activity:  Normal  Concentration:  Attention Span: Good  Recall:  Tangent of Knowledge:  Fair  Language:  Good  Akathisia:  Negative  Handed:  Right  AIMS (if indicated):     Assets:  Physical Health Resilience  ADL's:  Intact   Cognition:  WNL  Sleep:  Number of Hours: 2     Treatment Plan Summary: Daily contact with patient to assess and evaluate symptoms and progress in treatment, Medication management and Plan As above probable discharge tomorrow or the next day again issues of secondary gain prominent no change in meds  Hilma Steinhilber, MD 08/19/2018, 10:50 AM

## 2018-08-19 NOTE — Progress Notes (Signed)
D: Pt denies SI/HI/VH, passive AH . Pt is pleasant and cooperative. Pt visible on the unit. Pt worried about the voices. A: Pt was offered support and encouragement. Pt was given scheduled medications. Pt was encourage to attend groups. Q 15 minute checks were done for safety.   R: Pt is taking medication. Pt has no complaints.Pt receptive to treatment and safety maintained on unit.

## 2018-08-19 NOTE — Progress Notes (Signed)
Recreation Therapy Notes  Date: 7.13.20 Time: 1013 Location: 500 [address] Dayroom  Group Topic: Coping Skills  Goal Area(s) Addresses:  Patient will identify positive coping skills. Patient will identify benefits of using coping skills post d/c.  Intervention:  [employer], dry erase marker, worksheet  Activity:  Mind Map.  Patients and LRT filled out first 8 boxes together with anger, substance abuse, being sad, anxiety, verbal abuse, torture and finances.  Patients will then come up with at least three coping skills per situation.  LRT will then write the coping skills on the board so patience can fill in the blank spots on their worksheets.  Education: Radiographer, therapeutic, Dentist.   Education Outcome: Acknowledges understanding/In group clarification offered/Needs additional education.   Clinical Observations/Feedback:  Pt did not attend group.    Victorino Sparrow, LRT/CTRS         Ria Comment, Ibn Stief A 08/19/2018 11:08 AM

## 2018-08-20 MED ORDER — MIRTAZAPINE 30 MG PO TABS
30.0000 mg | ORAL_TABLET | Freq: Every day | ORAL | Status: DC
  Administered 2018-08-20: 21:00:00 30 mg via ORAL
  Filled 2018-08-20: qty 7
  Filled 2018-08-20 (×2): qty 1
  Filled 2018-08-20: qty 2

## 2018-08-20 NOTE — Progress Notes (Signed)
Patient ID: Edwin Henry, male   DOB: 06/24/1960, 57 y.o.   MRN: [id number]  [name] NOVEL CORONAVIRUS (COVID-19) DAILY CHECK-OFF SYMPTOMS - answer yes or no to each - every day NO YES  Have you had a fever in the past 24 hours?  . Fever (Temp > 37.80C / 100F) X   Have you had any of these symptoms in the past 24 hours? . New Cough .  Sore Throat  .  Shortness of Breath .  Difficulty Breathing .  Unexplained Body Aches   X   Have you had any one of these symptoms in the past 24 hours not related to allergies?   . Runny Nose .  Nasal Congestion .  Sneezing   X   If you have had runny nose, nasal congestion, sneezing in the past 24 hours, has it worsened?  X   EXPOSURES - check yes or no X   Have you traveled outside the state in the past 14 days?  X   Have you been in contact with someone with a confirmed diagnosis of COVID-19 or PUI in the past 14 days without wearing appropriate PPE?  X   Have you been living in the same home as a person with confirmed diagnosis of COVID-19 or a PUI (household contact)?    X   Have you been diagnosed with COVID-19?    X              What to do next: Answered NO to all: Answered YES to anything:   Proceed with unit schedule Follow the [facility] Inpatient Flowsheet.    

## 2018-08-20 NOTE — Progress Notes (Signed)
[facility] MD Progress Note  08/20/2018 10:21 AM Edwin Henry  MRN:  [id number] Subjective:   Patient alert oriented to person place time situation no longer confused but now endorsing auditory hallucinations and thoughts of self-harm but further acknowledging that he has nowhere to stay he would like to take the day to find housing but again has resolved as far as his Warnicke's encephalopathy Principal Problem: MDD (major depressive disorder), recurrent, severe, with psychosis ([place]) Diagnosis: Principal Problem:   MDD (major depressive disorder), recurrent, severe, with psychosis ([place]) Active Problems:   Alcohol use disorder, moderate, dependence ([place])   Schizoaffective disorder, bipolar type ([place])  Total Time spent with patient: 20 minutes  Past Medical History:  Past Medical History:  Diagnosis Date  . Schizophrenia [facility])     Past Surgical History:  Procedure Laterality Date  . KNEE SURGERY     Family History: History reviewed. No pertinent family history.  Social History:  Social History   Substance and Sexual Activity  Alcohol Use Yes   Comment: 1-2 beers every 2 days     Social History   Substance and Sexual Activity  Drug Use Yes  . Types: Marijuana, Cocaine   Comment: occasional THC use    Social History   Socioeconomic History  . Marital status: Single    Spouse name: Not on file  . Number of children: Not on file  . Years of education: Not on file  . Highest education level: Not on file  Occupational History  . Not on file  Social Needs  . Financial resource strain: Not on file  . Food insecurity    Worry: Not on file    Inability: Not on file  . Transportation needs    Medical: Not on file    Non-medical: Not on file  Tobacco Use  . Smoking status: Current Every Day Smoker    Packs/day: 0.50  . Smokeless tobacco: Current User    Types: Chew  Substance and Sexual Activity  . Alcohol use: Yes    Comment: 1-2 beers every 2 days  . Drug use: Yes   Types: Marijuana, Cocaine    Comment: occasional THC use  . Sexual activity: Yes    Birth control/protection: Condom  Lifestyle  . Physical activity    Days per week: Not on file    Minutes per session: Not on file  . Stress: Not on file  Relationships  . Social Herbalist on phone: Not on file    Gets together: Not on file    Attends religious service: Not on file    Active member of club or organization: Not on file    Attends meetings of clubs or organizations: Not on file    Relationship status: Not on file  Other Topics Concern  . Not on file  Social History Narrative  . Not on file   Additional Social History:    Pain Medications: see MAR Prescriptions: see MAR Over the Counter: see MAR History of alcohol / drug use?: Yes Longest period of sobriety (when/how long): none reported Negative Consequences of Use: Personal relationships, Work / Youth worker, Museum/gallery curator Name of Substance 1: alcohol 1 - Age of First Use: 18 1 - Amount (size/oz): quart of beer 1 - Frequency: weekly 1 - Duration: since onset 1 - Last Use / Amount: today Name of Substance 2: cocaine 2 - Age of First Use: not assessed 2 - Amount (size/oz): $40 2 - Frequency: occasional 2 -  Duration: unknown 2 - Last Use / Amount: few days ago Name of Substance 3: Marijuana 3 - Age of First Use: not assessed 3 - Amount (size/oz): 1 gram 3 - Frequency: occasionally 3 - Duration: unknown 3 - Last Use / Amount: patient states that it has been awhile              Sleep: Good  Appetite:  Good  Current Medications: Current Facility-Administered Medications  Medication Dose Route Frequency Provider Last Rate Last Dose  . acetaminophen (TYLENOL) tablet 650 mg  650 mg Oral Q6H PRN Rankin, Shuvon B, NP   650 mg at 08/17/18 1829  . alum & mag hydroxide-simeth (MAALOX/MYLANTA) 200-200-20 MG/5ML suspension 30 mL  30 mL Oral Q4H PRN Rankin, Shuvon B, NP      . benztropine (COGENTIN) tablet 1 mg  1 mg  Oral BID Rankin, Shuvon B, NP   1 mg at 08/20/18 0742  . feeding supplement (ENSURE ENLIVE) (ENSURE ENLIVE) liquid 237 mL  237 mL Oral Daily PRN Cherly Beachorman, Jacqueline J, DO   237 mL at 08/20/18 0744  . haloperidol (HALDOL) tablet 10 mg  10 mg Oral QHS Cobos, Rockey SituFernando A, MD   10 mg at 08/19/18 2057  . haloperidol (HALDOL) tablet 5 mg  5 mg Oral BID Oneta RackLewis, Tanika N, NP   5 mg at 08/20/18 0742  . magnesium hydroxide (MILK OF MAGNESIA) suspension 30 mL  30 mL Oral Daily PRN Rankin, Shuvon B, NP      . mirtazapine (REMERON) tablet 15 mg  15 mg Oral QHS Oneta RackLewis, Tanika N, NP   15 mg at 08/19/18 2057  . multivitamin with minerals tablet 1 tablet  1 tablet Oral Daily Rankin, Shuvon B, NP   1 tablet at 08/20/18 0742  . nicotine (NICODERM CQ - dosed in mg/24 hours) patch 14 mg  14 mg Transdermal Daily Cobos, Rockey SituFernando A, MD   14 mg at 08/20/18 0743  . OLANZapine zydis (ZYPREXA) disintegrating tablet 10 mg  10 mg Oral Q8H PRN Cobos, Rockey SituFernando A, MD   10 mg at 08/18/18 2349   And  . ziprasidone (GEODON) injection 20 mg  20 mg Intramuscular PRN Cobos, Fernando A, MD      . prazosin (MINIPRESS) capsule 1 mg  1 mg Oral QHS Cobos, Rockey SituFernando A, MD   1 mg at 08/19/18 2057    Lab Results: No results found for this or any previous visit (from the past 48 hour(s)).  Blood Alcohol level:  Lab Results  Component Value Date   ETH <10 08/13/2018   ETH 163 (H) 05/22/2018    Metabolic Disorder Labs: Lab Results  Component Value Date   HGBA1C 5.4 08/13/2018   MPG 108.28 08/13/2018   MPG 111.15 10/11/2017   Lab Results  Component Value Date   PROLACTIN 22.1 (H) 10/11/2017   Lab Results  Component Value Date   CHOL 235 (H) 08/13/2018   TRIG 210 (H) 08/13/2018   HDL 41 08/13/2018   CHOLHDL 5.7 08/13/2018   VLDL 42 (H) 08/13/2018   LDLCALC 152 (H) 08/13/2018   LDLCALC UNABLE TO CALCULATE IF TRIGLYCERIDE OVER 400 mg/dL 78/29/562109/06/2017    Physical Findings: AIMS: Facial and Oral Movements Muscles of Facial  Expression: None, normal Lips and Perioral Area: None, normal Jaw: None, normal Tongue: None, normal,Extremity Movements Upper (arms, wrists, hands, fingers): Mild Lower (legs, knees, ankles, toes): None, normal, Trunk Movements Neck, shoulders, hips: None, normal, Overall Severity Severity of abnormal movements (highest score  from questions above): None, normal Incapacitation due to abnormal movements: None, normal Patient's awareness of abnormal movements (rate only patient's report): No Awareness, Dental Status Current problems with teeth and/or dentures?: No Does patient usually wear dentures?: No  CIWA:  CIWA-Ar Total: 5 COWS:  COWS Total Score: 0  Musculoskeletal: Strength & Muscle Tone: within normal limits Gait & Station: normal Patient leans: N/A  Psychiatric Specialty Exam: Physical Exam  ROS  Blood pressure 111/72, pulse (!) 104, temperature 97.6 F (36.4 C), temperature source Oral, resp. rate 20, height 6\' 2"  (1.88 m), weight 76.2 kg, SpO2 96 %.Body mass index is 21.57 kg/m.  General Appearance: Casual  Eye Contact:  Good  Speech:  Clear and Coherent  Volume:  Normal  Mood:  Dysphoric  Affect:  Constricted  Thought Process:  Coherent and Descriptions of Associations: Intact  Orientation:  Full (Time, Place, and Person)  Thought Content:  Logical  Suicidal Thoughts:  No  Homicidal Thoughts:  No  Memory:  Immediate;   Fair  Judgement:  Intact  Insight:  Fair  Psychomotor Activity:  Normal  Concentration:  Concentration: Fair  Recall:  [employer]Fair  Fund of Knowledge:  Fair  Language:  Fair  Akathisia:  Negative  Handed:  Right  AIMS (if indicated):     Assets:  Communication Skills Desire for Improvement  ADL's:  Intact  Cognition:  WNL  Sleep:  Number of Hours: 1.5     Treatment Plan Summary: Daily contact with patient to assess and evaluate symptoms and progress in treatment, Medication management and Plan Continue vitamin therapies detox is complete but  insomnia persists we will add sleep aid probable discharge tomorrow  Malvin JohnsFARAH,Rickey Sadowski, MD 08/20/2018, 10:21 AM

## 2018-08-20 NOTE — Progress Notes (Signed)
Pt up having hard time sleeping, but pt was noted sleeping 6 hrs during the day per 15 minute check sheet. Pt appeared to possibly have day and nigh mixed up. Pt appeared to have that issue last week , thinking it was during the day while it was the middle of the night.

## 2018-08-20 NOTE — Progress Notes (Signed)
Recreation Therapy Notes  Date: 7.14.20 Time: 1012 Location: 500 Hall Dayroom  Group Topic: Anxiety  Goal Area(s) Addresses:  Patient will identify what makes them anxious. Patient will identify coping skills for anxiety.  Behavioral Response: Engaged  Intervention: Worksheet  Activity: Introduction to Anxiety.  Patients were to identify what causes anxiety, physical symptoms they experience, thoughts they experience and coping skills they use for anxiety.  Education: Wellness, Dentist.   Education Outcome: Acknowledges education/In group clarification offered/Needs additional education.   Clinical Observations/Feedback:  Pt stated people not listening to him cause anxiety.  Pt identified his only physical symptom as hearing voices.  Pt expressed the thoughts that go through his mind are headaches, lack of sleep and voices.  Pt stated he goes for a walk or takes Tylenol as coping skills.     Edwin Henry, LRT/CTRS     Edwin Henry A 08/20/2018 11:16 AM

## 2018-08-20 NOTE — Progress Notes (Signed)
Pt getting agitated about not being able to sleep. Pt requesting a shot. Pt encouraged to take pill, but pt stated" give me the shot". Pt was given 20mg  Geodon per [facility]

## 2018-08-20 NOTE — Progress Notes (Signed)
D: Pt denies SI/HI/VH , +ve AH- better. Pt is pleasant and cooperative. Pt visible on the unit for a little while. Pt stated if he did not go to sleep off the medications pt requested the Geodon shot.   A: Pt was offered support and encouragement. Pt was given scheduled medications. Pt was encourage to attend groups. Q 15 minute checks were done for safety.   R:Pt attends groups and interacts well with peers and staff. Pt is taking medication. Pt has no complaints.Pt receptive to treatment and safety maintained on unit.  Problem: Education: Goal: Emotional status will improve Outcome: Progressing   Problem: Safety: Goal: Periods of time without injury will increase Outcome: Progressing   Problem: Activity: Goal: Interest or engagement in leisure activities will improve Outcome: Progressing

## 2018-08-20 NOTE — [facility] Group Notes (Signed)
[facility] LCSW Group Therapy Note  Date/Time: 08/20/2018 @ 11am  Type of Therapy and Topic:  Group Therapy:  Overcoming Obstacles  Participation Level:  Did not attend   Description of Group:    In this group patients will be encouraged to explore what they see as obstacles to their own wellness and recovery. They will be guided to discuss their thoughts, feelings, and behaviors related to these obstacles. The group will process together ways to cope with barriers, with attention given to specific choices patients can make. Each patient will be challenged to identify changes they are motivated to make in order to overcome their obstacles. This group will be process-oriented, with patients participating in exploration of their own experiences as well as giving and receiving support and challenge from other group members.  Therapeutic Goals: 1. Patient will identify personal and current obstacles as they relate to admission. 2. Patient will identify barriers that currently interfere with their wellness or overcoming obstacles.  3. Patient will identify feelings, thought process and behaviors related to these barriers. 4. Patient will identify two changes they are willing to make to overcome these obstacles:    Summary of Patient Progress   Patient did not attend group today.    Therapeutic Modalities:   Cognitive Behavioral Therapy Solution Focused Therapy Motivational Interviewing Relapse Prevention Therapy   Ardelle Anton, LCSW

## 2018-08-20 NOTE — Plan of Care (Signed)
D: Pt alert and oriented on the unit. Pt engaging with RN staff and other pts. Pt denies SI/HI, A/VH. Pt was isolated to his room in bed during the day and did not attend groups or unit activities. PT is cooperative. A: Education, support and encouragement provided, q15 minute safety checks remain in effect. Medications administered per MD orders. R: No reactions/side effects to medicine noted. Pt denies any concerns at this time, and verbally contracts for safety. Pt ambulating on the unit with no issues. Pt remains safe on the unit.   Problem: Safety: Goal: Periods of time without injury will increase Outcome: Progressing   Problem: Medication: Goal: Compliance with prescribed medication regimen will improve Outcome: Progressing

## 2018-08-21 MED ORDER — HALOPERIDOL 5 MG PO TABS
20.0000 mg | ORAL_TABLET | Freq: Every day | ORAL | Status: DC
  Filled 2018-08-21: qty 28

## 2018-08-21 MED ORDER — HALOPERIDOL 20 MG PO TABS: 20.0000 mg | ORAL_TABLET | Freq: Every day | ORAL | 2 refills | Status: DC

## 2018-08-21 MED ORDER — FLUOXETINE HCL 20 MG PO CAPS
20.0000 mg | ORAL_CAPSULE | Freq: Every day | ORAL | Status: DC
  Filled 2018-08-21 (×2): qty 7

## 2018-08-21 MED ORDER — PRAZOSIN HCL 5 MG PO CAPS
5.0000 mg | ORAL_CAPSULE | Freq: Every day | ORAL | Status: DC
  Filled 2018-08-21: qty 7

## 2018-08-21 MED ORDER — MIRTAZAPINE 30 MG PO TABS: 30.0000 mg | ORAL_TABLET | Freq: Every day | ORAL | 2 refills | Status: DC

## 2018-08-21 MED ORDER — VITAMIN B-1 100 MG PO TABS
250.0000 mg | ORAL_TABLET | Freq: Every day | ORAL | Status: DC
  Filled 2018-08-21 (×2): qty 18

## 2018-08-21 MED ORDER — BENZTROPINE MESYLATE 1 MG PO TABS: 1.0000 mg | ORAL_TABLET | Freq: Two times a day (BID) | ORAL | 2 refills | Status: DC

## 2018-08-21 MED ORDER — THIAMINE HCL 250 MG PO TABS: 250.0000 mg | ORAL_TABLET | Freq: Every day | ORAL | 1 refills | Status: DC

## 2018-08-21 NOTE — Discharge Summary (Addendum)
Physician Discharge Summary Note  Patient:  Edwin Henry is an 58 y.o., male MRN:  [id number] DOB:  April 06, 1960 Patient phone:  [phone] (home)  Patient address:   [address] [place] [address],  Total Time spent with patient: 15 minutes  Date of Admission:  08/12/2018 Date of Discharge: 08/21/18  Reason for Admission:  SI with CAH to walk into traffic  Principal Problem: MDD (major depressive disorder), recurrent, severe, with psychosis (HCC) Discharge Diagnoses: Principal Problem:   MDD (major depressive disorder), recurrent, severe, with psychosis (HCC) Active Problems:   Alcohol use disorder, moderate, dependence (HCC)   Schizoaffective disorder, bipolar type (HCC)   Past Psychiatric History: History of schizophrenia. Multiple ED visits for auditory hallucinations and multiple hospitalizations. Hospitalized at [facility] in September 2019 and discharged on Haldol and Zoloft. Denies prior suicide attempts.  Past Medical History:  Past Medical History:  Diagnosis Date  . Schizophrenia [facility](HCC)     Past Surgical History:  Procedure Laterality Date  . KNEE SURGERY     Family History: History reviewed. No pertinent family history. Family Psychiatric  History: Denies Social History:  Social History   Substance and Sexual Activity  Alcohol Use Yes   Comment: 1-2 beers every 2 days     Social History   Substance and Sexual Activity  Drug Use Yes  . Types: Marijuana, Cocaine   Comment: occasional THC use    Social History   Socioeconomic History  . Marital status: Single    Spouse name: Not on file  . Number of children: Not on file  . Years of education: Not on file  . Highest education level: Not on file  Occupational History  . Not on file  Social Needs  . Financial resource strain: Not on file  . Food insecurity    Worry: Not on file    Inability: Not on file  . Transportation needs    Medical: Not on file    Non-medical: Not on file   Tobacco Use  . Smoking status: Current Every Day Smoker    Packs/day: 0.50  . Smokeless tobacco: Current User    Types: Chew  Substance and Sexual Activity  . Alcohol use: Yes    Comment: 1-2 beers every 2 days  . Drug use: Yes    Types: Marijuana, Cocaine    Comment: occasional THC use  . Sexual activity: Yes    Birth control/protection: Condom  Lifestyle  . Physical activity    Days per week: Not on file    Minutes per session: Not on file  . Stress: Not on file  Relationships  . Social Musicianconnections    Talks on phone: Not on file    Gets together: Not on file    Attends religious service: Not on file    Active member of club or organization: Not on file    Attends meetings of clubs or organizations: Not on file    Relationship status: Not on file  Other Topics Concern  . Not on file  Social History Narrative  . Not on file    Hospital Course:  From admission H&P: Edwin Henry is a 58 year old male with history of schizophrenia and depression, presenting for treatment of SI with CAH to walk into traffic. He reports auditory hallucinations have been chronic and ongoing for years but have worsened over the last several months, with demeaning comments and CAH to hurt himself. He reports VH of shadows. He reports increased levels of  depression over the last several months but is vague when asked about stressors and symptoms, other than to say he does not like social isolation with the pandemic. He reports that he lives with his mother and receives disability. He has history of multiple ED visits for auditory hallucinations and was admitted for observation in April 2020 after recent admission at [facility]. He has history of cocaine and alcohol use but minimizes this, states he only uses cocaine "occasionally." UDS positive for cocaine and THC. He reports drinking 4 shots of liquor once a week. BAL<10. He is seen at [facility] and reports compliance with Haldol 5 mg QAM and 10 mg QHS, Prozac 20  mg daily, and Minipress 5 mg QHS. He reports continuing CAH to walk into traffic but denies suicidal plan or intent on the unit and contracts for safety. Denies HI. His affect is constricted, but he smiles at times appropriately with interaction. No signs of responding to internal stimuli.  Edwin Henry was admitted for SI with CAH to walk into traffic. He remained on the [facility] unit for nine days. Haldol was increased. Minipress, Prozac, and Cogentin were continued. Remeron and thiamine were started. He participated in group therapy on the unit. He was noted to be confused and disoriented on the unit early in admission, asking where his mother was and trying to get into the linen closet while stating "I need to go to the bank." This was felt to be related to Wernicke's from his alcohol use. Confusion resolved with thiamine therapy. It was noted after apparent resolution of symptoms that patient continued to report SI and CAH whenever discharge was mentioned and kept asking for "one more day" due to lack of housing. On day of discharge he is alert and oriented x 3. He initially reports SI and CAH but later admits he is falsely reporting these symptoms because he is homeless and does not want to stay at his mother's house. He contracts for safety. No further withdrawal symptoms noted. He has shown improved mood, affect, sleep, appetite, and interaction throughout hospitalization. He is discharging on the medications listed below. He agrees to follow up with [facility] ACT team (see below). He is provided with prescriptions for medications upon discharge. He is taking the bus for discharge to his mother's house.  Physical Findings: AIMS: Facial and Oral Movements Muscles of Facial Expression: None, normal Lips and Perioral Area: None, normal Jaw: None, normal Tongue: None, normal,Extremity Movements Upper (arms, wrists, hands, fingers): None, normal Lower (legs, knees, ankles, toes): None, normal, Trunk  Movements Neck, shoulders, hips: None, normal, Overall Severity Severity of abnormal movements (highest score from questions above): None, normal Incapacitation due to abnormal movements: None, normal Patient's awareness of abnormal movements (rate only patient's report): No Awareness, Dental Status Current problems with teeth and/or dentures?: No Does patient usually wear dentures?: No  CIWA:  CIWA-Ar Total: 5 COWS:  COWS Total Score: 0  Musculoskeletal: Strength & Muscle Tone: within normal limits Gait & Station: normal Patient leans: N/A  Psychiatric Specialty Exam: Physical Exam  Nursing note and vitals reviewed. Constitutional: He is oriented to person, place, and time. He appears well-developed and well-nourished.  Cardiovascular: Normal rate.  Respiratory: Effort normal.  Neurological: He is alert and oriented to person, place, and time.    Review of Systems  Constitutional: Negative.   Psychiatric/Behavioral: Positive for hallucinations (AH improved with medication; denies CAH). Negative for depression, substance abuse and suicidal ideas. The patient is not nervous/anxious and does not  have insomnia.     Blood pressure 115/71, pulse 73, temperature 97.6 F (36.4 C), resp. rate 18, height 6\' 2"  (1.88 m), weight 76.2 kg, SpO2 96 %.Body mass index is 21.57 kg/m.  See MD's discharge SRA     Have you used any form of tobacco in the last 30 days? (Cigarettes, Smokeless Tobacco, Cigars, and/or Pipes): Yes  Has this patient used any form of tobacco in the last 30 days? (Cigarettes, Smokeless Tobacco, Cigars, and/or Pipes)  Yes, A prescription for an FDA-approved tobacco cessation medication was offered at discharge and the patient refused  Blood Alcohol level:  Lab Results  Component Value Date   ETH <10 08/13/2018   ETH 163 (H) 05/22/2018    Metabolic Disorder Labs:  Lab Results  Component Value Date   HGBA1C 5.4 08/13/2018   MPG 108.28 08/13/2018   MPG 111.15  10/11/2017   Lab Results  Component Value Date   PROLACTIN 22.1 (H) 10/11/2017   Lab Results  Component Value Date   CHOL 235 (H) 08/13/2018   TRIG 210 (H) 08/13/2018   HDL 41 08/13/2018   CHOLHDL 5.7 08/13/2018   VLDL 42 (H) 08/13/2018   LDLCALC 152 (H) 08/13/2018   LDLCALC UNABLE TO CALCULATE IF TRIGLYCERIDE OVER 400 mg/dL 16/10/960409/06/2017    See Psychiatric Specialty Exam and Suicide Risk Assessment completed by Attending Physician prior to discharge.  Discharge destination:  Home  Is patient on multiple antipsychotic therapies at discharge:  No   Has Patient had three or more failed trials of antipsychotic monotherapy by history:  No  Recommended Plan for Multiple Antipsychotic Therapies: NA   Allergies as of 08/21/2018   No Known Allergies     Medication List    STOP taking these medications   hydrOXYzine 50 MG tablet Commonly known as: ATARAX/VISTARIL   traZODone 50 MG tablet Commonly known as: DESYREL     TAKE these medications     Indication  benztropine 1 MG tablet Commonly known as: COGENTIN Take 1 tablet (1 mg total) by mouth 2 (two) times daily.  Indication: Extrapyramidal Reaction caused by Medications   FLUoxetine 20 MG capsule Commonly known as: PROZAC Take 20 mg by mouth at bedtime.  Indication: Depression   haloperidol 20 MG tablet Commonly known as: HALDOL Take 1 tablet (20 mg total) by mouth at bedtime. What changed:   medication strength  how much to take  Another medication with the same name was removed. Continue taking this medication, and follow the directions you see here.  Indication: Psychosis   mirtazapine 30 MG tablet Commonly known as: REMERON Take 1 tablet (30 mg total) by mouth at bedtime.  Indication: Major Depressive Disorder   prazosin 5 MG capsule Commonly known as: MINIPRESS Take 5 mg by mouth at bedtime.  Indication: Frightening Dreams   thiamine 250 MG tablet Take 1 tablet (250 mg total) by mouth daily.   Indication: Deficiency of Vitamin B1      Follow-up Information    [facility] Follow up on 08/22/2018.   Why: Your ACTT services will continue when you discharge on Thursday, 7/16 between 11:00a-1:00p.   Contact information: [address] [place] [address] [phone]           Follow-up recommendations: Activity as tolerated. Diet as recommended by primary care physician. Keep all scheduled follow-up appointments as recommended.   Comments:   Patient is instructed to take all prescribed medications as recommended. Report any side effects or adverse reactions to  your outpatient psychiatrist. Patient is instructed to abstain from alcohol and illegal drugs while on prescription medications. In the event of worsening symptoms, patient is instructed to call the crisis hotline, 911, or go to the nearest emergency department for evaluation and treatment.  Signed: Aldean BakerJanet E Arlon Bleier, NP 08/21/2018, 3:55 PM

## 2018-08-21 NOTE — Progress Notes (Signed)
   08/21/18 0807  COVID-19 Daily Checkoff  Have you had a fever (temp > 37.80C/100F)  in the past 24 hours?  No  If you have had runny nose, nasal congestion, sneezing in the past 24 hours, has it worsened? No  COVID-19 EXPOSURE  Have you traveled outside the state in the past 14 days? No  Have you been in contact with someone with a confirmed diagnosis of COVID-19 or PUI in the past 14 days without wearing appropriate PPE? No  Have you been living in the same home as a person with confirmed diagnosis of COVID-19 or a PUI (household contact)? No  Have you been diagnosed with COVID-19? No

## 2018-08-21 NOTE — [facility] Suicide Risk Assessment (Signed)
[facility] Discharge Suicide Risk Assessment   Principal Problem: MDD (major depressive disorder), recurrent, severe, with psychosis ([place]) Discharge Diagnoses: Principal Problem:   MDD (major depressive disorder), recurrent, severe, with psychosis ([place]) Active Problems:   Alcohol use disorder, moderate, dependence ([place])   Schizoaffective disorder, bipolar type ([place])   Total Time spent with patient: 45 minutes  Musculoskeletal: Strength & Muscle Tone: within normal limits Gait & Station: normal Patient leans: N/A  Psychiatric Specialty Exam: ROS  Blood pressure 115/71, pulse 73, temperature 97.6 F (36.4 C), resp. rate 18, height 6\' 2"  (1.88 m), weight 76.2 kg, SpO2 96 %.Body mass index is 21.57 kg/m.  General Appearance: Casual  Eye Contact::  Good  Speech:  Clear and Coherent409  Volume:  Normal  Mood:  Euthymic  Affect:  Congruent  Thought Process:  Coherent and Descriptions of Associations: Intact  Orientation:  Full (Time, Place, and Person)  Thought Content:  Rumination  Suicidal Thoughts:  No  Homicidal Thoughts:  No  Memory:  Immediate;   Fair  Judgement:  Fair  Insight:  Fair  Psychomotor Activity:  Normal  Concentration:  Fair  Recall:  [employer] of Knowledge:Fair  Language: Fair  Akathisia:  Negative  Handed:  Right  AIMS (if indicated):     Assets:  Communication Skills Desire for Improvement Physical Health Resilience  Sleep:  Number of Hours: 3.75  Cognition: WNL  ADL's:  Intact   Mental Status Per Nursing Assessment::   On Admission:  Suicidal ideation indicated by patient, Suicide plan  Demographic Factors:  Unemployed  Loss Factors: Decrease in vocational status  Historical Factors: NA  Risk Reduction Factors:   NA  Continued Clinical Symptoms:  Alcohol/Substance Abuse/Dependencies  Cognitive Features That Contribute To Risk:  Polarized thinking    Suicide Risk: Continues to deny suicidal thoughts plans or intent today but would like  further hospitalization for housing purposes and elaborates that he still hears and sees things but then on further examination acknowledges he simply stating this to stay hospitalized Minimal: No identifiable suicidal ideation.  Patients presenting with no risk factors but with morbid ruminations; may be classified as minimal risk based on the severity of the depressive symptoms  Follow-up Information    [facility] Follow up.   Why: Follow-up appointment for medication management is needed. Contact information: [address] [place] [address] [phone]           Plan Of Care/Follow-up recommendations:  Activity:  full  Johnel Yielding, MD 08/21/2018, 8:56 AM

## 2018-08-21 NOTE — Tx Team (Signed)
Interdisciplinary Treatment and Diagnostic Plan Update  08/21/2018 Time of Session: 09:06am Edwin HackerBilly Henry MRN: [id number]  Principal Diagnosis: MDD (major depressive disorder), recurrent, severe, with psychosis (HCC)  Secondary Diagnoses: Principal Problem:   MDD (major depressive disorder), recurrent, severe, with psychosis (HCC) Active Problems:   Alcohol use disorder, moderate, dependence (HCC)   Schizoaffective disorder, bipolar type (HCC)   Current Medications:  Current Facility-Administered Medications  Medication Dose Route Frequency Provider Last Rate Last Dose  . acetaminophen (TYLENOL) tablet 650 mg  650 mg Oral Q6H PRN Rankin, Shuvon B, NP   650 mg at 08/17/18 1829  . alum & mag hydroxide-simeth (MAALOX/MYLANTA) 200-200-20 MG/5ML suspension 30 mL  30 mL Oral Q4H PRN Rankin, Shuvon B, NP      . benztropine (COGENTIN) tablet 1 mg  1 mg Oral BID Rankin, Shuvon B, NP   1 mg at 08/21/18 0808  . feeding supplement (ENSURE ENLIVE) (ENSURE ENLIVE) liquid 237 mL  237 mL Oral Daily PRN Cherly Beachorman, Edwin J, DO   237 mL at 08/20/18 0744  . haloperidol (HALDOL) tablet 10 mg  10 mg Oral QHS Cobos, Edwin SituFernando A, MD   10 mg at 08/20/18 2045  . haloperidol (HALDOL) tablet 5 mg  5 mg Oral BID Oneta RackLewis, Edwin N, NP   5 mg at 08/21/18 0807  . magnesium hydroxide (MILK OF MAGNESIA) suspension 30 mL  30 mL Oral Daily PRN Rankin, Shuvon B, NP      . mirtazapine (REMERON) tablet 30 mg  30 mg Oral QHS Malvin JohnsFarah, Brian, MD   30 mg at 08/20/18 2045  . multivitamin with minerals tablet 1 tablet  1 tablet Oral Daily Rankin, Shuvon B, NP   1 tablet at 08/21/18 0807  . nicotine (NICODERM CQ - dosed in mg/24 hours) patch 14 mg  14 mg Transdermal Daily Cobos, Edwin SituFernando A, MD   14 mg at 08/21/18 0807  . OLANZapine zydis (ZYPREXA) disintegrating tablet 10 mg  10 mg Oral Q8H PRN Cobos, Edwin SituFernando A, MD   10 mg at 08/18/18 2349  . prazosin (MINIPRESS) capsule 1 mg  1 mg Oral QHS Cobos, Edwin SituFernando A, MD   1 mg at 08/20/18  2045   PTA Medications: Medications Prior to Admission  Medication Sig Dispense Refill Last Dose  . FLUoxetine (PROZAC) 20 MG capsule Take 20 mg by mouth at bedtime.     . haloperidol (HALDOL) 10 MG tablet Take 1 tablet (10 mg total) by mouth at bedtime. 30 tablet 0   . haloperidol (HALDOL) 5 MG tablet Take 1 tablet (5 mg total) by mouth every morning. 30 tablet 0   . hydrOXYzine (ATARAX/VISTARIL) 50 MG tablet Take 1 tablet (50 mg total) by mouth every 6 (six) hours as needed for anxiety. 30 tablet 0   . prazosin (MINIPRESS) 5 MG capsule Take 5 mg by mouth at bedtime.     . traZODone (DESYREL) 50 MG tablet Take 1 tablet (50 mg total) by mouth at bedtime and may repeat dose one time if needed. 30 tablet 0   . [DISCONTINUED] benztropine (COGENTIN) 1 MG tablet Take 1 tablet (1 mg total) by mouth 2 (two) times daily. 60 tablet 0     Patient Stressors: Financial difficulties Medication change or noncompliance  Patient Strengths: Ability for insight Wellsite geologistCommunication skills General fund of knowledge Motivation for treatment/growth  Treatment Modalities: Medication Management, Group therapy, Case management,  1 to 1 session with clinician, Psychoeducation, Recreational therapy.   Physician Treatment Plan for Primary Diagnosis: MDD (  major depressive disorder), recurrent, severe, with psychosis ([place]) Long Term Goal(s): Improvement in symptoms so as ready for discharge Improvement in symptoms so as ready for discharge   Short Term Goals: Ability to identify changes in lifestyle to reduce recurrence of condition will improve Ability to verbalize feelings will improve Ability to disclose and discuss suicidal ideas Ability to demonstrate self-control will improve Ability to identify and develop effective coping behaviors will improve  Medication Management: Evaluate patient's response, side effects, and tolerance of medication regimen.  Therapeutic Interventions: 1 to 1 sessions, Unit Group  sessions and Medication administration.  Evaluation of Outcomes: Adequate for Discharge  Physician Treatment Plan for Secondary Diagnosis: Principal Problem:   MDD (major depressive disorder), recurrent, severe, with psychosis ([place]) Active Problems:   Alcohol use disorder, moderate, dependence ([place])   Schizoaffective disorder, bipolar type ([place])  Long Term Goal(s): Improvement in symptoms so as ready for discharge Improvement in symptoms so as ready for discharge   Short Term Goals: Ability to identify changes in lifestyle to reduce recurrence of condition will improve Ability to verbalize feelings will improve Ability to disclose and discuss suicidal ideas Ability to demonstrate self-control will improve Ability to identify and develop effective coping behaviors will improve     Medication Management: Evaluate patient's response, side effects, and tolerance of medication regimen.  Therapeutic Interventions: 1 to 1 sessions, Unit Group sessions and Medication administration.  Evaluation of Outcomes: Adequate for Discharge   RN Treatment Plan for Primary Diagnosis: MDD (major depressive disorder), recurrent, severe, with psychosis ([place]) Long Term Goal(s): Knowledge of disease and therapeutic regimen to maintain health will improve  Short Term Goals: Ability to participate in decision making will improve, Ability to verbalize feelings will improve, Ability to disclose and discuss suicidal ideas, Ability to identify and develop effective coping behaviors will improve and Compliance with prescribed medications will improve  Medication Management: RN will administer medications as ordered by provider, will assess and evaluate patient's response and provide education to patient for prescribed medication. RN will report any adverse and/or side effects to prescribing provider.  Therapeutic Interventions: 1 on 1 counseling sessions, Psychoeducation, Medication administration, Evaluate  responses to treatment, Monitor vital signs and CBGs as ordered, Perform/monitor CIWA, COWS, AIMS and Fall Risk screenings as ordered, Perform wound care treatments as ordered.  Evaluation of Outcomes: Adequate for Discharge   LCSW Treatment Plan for Primary Diagnosis: MDD (major depressive disorder), recurrent, severe, with psychosis ([place]) Long Term Goal(s): Safe transition to appropriate next level of care at discharge, Engage patient in therapeutic group addressing interpersonal concerns.  Short Term Goals: Engage patient in aftercare planning with referrals and resources and Increase skills for wellness and recovery  Therapeutic Interventions: Assess for all discharge needs, 1 to 1 time with Social worker, Explore available resources and support systems, Assess for adequacy in community support network, Educate family and significant other(s) on suicide prevention, Complete Psychosocial Assessment, Interpersonal group therapy.  Evaluation of Outcomes: Adequate for Discharge   Progress in Treatment: Attending groups: No. Participating in groups: No. Taking medication as prescribed: Yes. Toleration medication: Yes. Family/Significant other contact made: Yes, individual(s) contacted:  with patient Patient understands diagnosis: Yes. Discussing patient identified problems/goals with staff: Yes. Medical problems stabilized or resolved: Yes. Denies suicidal/homicidal ideation: Yes. Issues/concerns per patient self-inventory: No. Other:   New problem(s) identified: No, Describe:  None  New Short Term/Long Term Goal(s): Medication stabilization, elimination of SI thoughts, and development of Henry comprehensive mental wellness plan.   Patient Goals:  Discharge Plan or Barriers: Patient is discharging day and continuing ACTT services with [facility].   Reason for Continuation of Hospitalization: Patient is discharging today.   Estimated Length of Stay: Patient is discharging today.    Attendees: Patient: 08/21/2018   Physician: Dr. Malvin JohnsBrian Farah, MD 08/21/2018   Nursing: Cletis AthensJon, RN 08/21/2018   RN Care Manager: 08/21/2018   Social Worker: Stephannie PetersJasmine Tarren Sabree, LCSW 08/21/2018   Recreational Therapist:  08/21/2018  Other:  08/21/2018   Other:  08/21/2018  Other: 08/21/2018      Scribe for Treatment Team: Delphia GratesJasmine M Gomer France, LCSW 08/21/2018 10:32 AM

## 2018-08-21 NOTE — Progress Notes (Signed)
  [facility] Adult Case Management Discharge Plan :  Will you be returning to the same living situation after discharge:  Yes,  home At discharge, do you have transportation home?: Yes,  Bus Do you have the ability to pay for your medications: Yes,  medicaid  Release of information consent forms completed and in the chart;  Patient's signature needed at discharge.  Patient to Follow up at: Follow-up Information    [facility] Follow up on 08/22/2018.   Why: Your ACTT services will continue when you discharge on Thursday, 7/16 between 11:00a-1:00p.   Contact information: [address] [place] [address] [phone]           Next level of care provider has access to [place] and Suicide Prevention discussed: Yes,  with patient  Have you used any form of tobacco in the last 30 days? (Cigarettes, Smokeless Tobacco, Cigars, and/or Pipes): Yes  Has patient been referred to the Quitline?: Patient refused referral  Patient has been referred for addiction treatment: Yes  Trecia Rogers, LCSW 08/21/2018, 10:35 AM

## 2018-08-21 NOTE — Progress Notes (Signed)
Did not attend group 

## 2018-08-21 NOTE — Progress Notes (Signed)
Recreation Therapy Notes  Date: 7.15.20 Time: 1010 Location:  [address] Dayroom  Group Topic: Wellness  Goal Area(s) Addresses:  Patient will define components of whole wellness. Patient will verbalize benefit of whole wellness.  Intervention: Exercise, Music  Activity: Exercise.  LRT played music and allowed each patient to lead the group in an exercise.  Patients were allowed to take breaks as needed.  Education: Wellness, Dentist.   Education Outcome: Acknowledges education/In group clarification offered/Needs additional education.   Clinical Observations/Feedback: Pt did not attend group.    Victorino Sparrow, LRT/CTRS         Victorino Sparrow A 08/21/2018 11:16 AM

## 2018-08-21 NOTE — Plan of Care (Signed)
Patient discharged Problem: Education: Goal: Knowledge of  General Education information/materials will improve Outcome: Adequate for Discharge Goal: Emotional status will improve Outcome: Adequate for Discharge   Problem: Safety: Goal: Periods of time without injury will increase Outcome: Adequate for Discharge   Problem: Activity: Goal: Interest or engagement in leisure activities will improve Outcome: Adequate for Discharge Goal: Imbalance in normal sleep/wake cycle will improve Outcome: Adequate for Discharge   Problem: Coping: Goal: Coping ability will improve Outcome: Adequate for Discharge Goal: Will verbalize feelings Outcome: Adequate for Discharge   Problem: Coping: Goal: Coping ability will improve Outcome: Adequate for Discharge   Problem: Medication: Goal: Compliance with prescribed medication regimen will improve Outcome: Adequate for Discharge   Problem: Self-Concept: Goal: Will verbalize positive feelings about self Outcome: Adequate for Discharge

## 2018-08-21 NOTE — Progress Notes (Signed)
D: Pt A & O X 3. Denies HI. Still endorsing passive SI with no plan and AH, which apparently does not resolve for him and no pain at this time. D/C home as ordered. Patient took bus home to his mother's. A: D/C instructions reviewed with pt including prescriptions and follow up appointment, compliance encouraged. All belongings from locker given to pt at time of departure. Scheduled and PRN medications given with verbal education and effects monitored. Safety checks maintained without incident till time of d/c.  R: Pt receptive to care. Compliant with medications when offered. Denies adverse drug reactions when assessed. Verbalized understanding related to d/c instructions. Signed belonging sheet in agreement with items received from locker. Ambulatory with a steady gait. Appears to be in no physical distress at time of departure.

## 2018-10-01 ENCOUNTER — Emergency Department (HOSPITAL_COMMUNITY)
Admission: EM | Admit: 2018-10-01 | Discharge: 2018-10-02 | Disposition: A | Discharge status: Other Acute Inpatient Hospital | Payer: Medicaid Other | Attending: Emergency Medicine | Admitting: Emergency Medicine

## 2018-10-01 ENCOUNTER — Encounter (HOSPITAL_COMMUNITY): Payer: Self-pay

## 2018-10-01 DIAGNOSIS — R443 Hallucinations, unspecified: Secondary | ICD-10-CM

## 2018-10-01 DIAGNOSIS — F209 Schizophrenia, unspecified: Secondary | ICD-10-CM | POA: Insufficient documentation

## 2018-10-01 DIAGNOSIS — F1020 Alcohol dependence, uncomplicated: Secondary | ICD-10-CM | POA: Insufficient documentation

## 2018-10-01 DIAGNOSIS — Z20828 Contact with and (suspected) exposure to other viral communicable diseases: Secondary | ICD-10-CM | POA: Insufficient documentation

## 2018-10-01 DIAGNOSIS — F17210 Nicotine dependence, cigarettes, uncomplicated: Secondary | ICD-10-CM | POA: Insufficient documentation

## 2018-10-01 DIAGNOSIS — Z79899 Other long term (current) drug therapy: Secondary | ICD-10-CM | POA: Insufficient documentation

## 2018-10-01 DIAGNOSIS — R45851 Suicidal ideations: Secondary | ICD-10-CM

## 2018-10-01 NOTE — ED Triage Notes (Signed)
Pt here voluntarily with [employer], he states that he called them because he's been hearing voices telling them to kill himself

## 2018-10-02 ENCOUNTER — Encounter (HOSPITAL_COMMUNITY): Payer: Self-pay | Admitting: *Deleted

## 2018-10-02 ENCOUNTER — Other Ambulatory Visit: Payer: Self-pay

## 2018-10-02 ENCOUNTER — Inpatient Hospital Stay (HOSPITAL_COMMUNITY)
Admission: AD | Admit: 2018-10-02 | Discharge: 2018-10-07 | DRG: 885 | Disposition: A | Discharge status: Home / Self Care | Payer: Medicaid Other | Source: Intra-hospital | Attending: Psychiatry | Admitting: Psychiatry

## 2018-10-02 DIAGNOSIS — Z79899 Other long term (current) drug therapy: Secondary | ICD-10-CM

## 2018-10-02 DIAGNOSIS — R45851 Suicidal ideations: Secondary | ICD-10-CM | POA: Diagnosis present

## 2018-10-02 DIAGNOSIS — G479 Sleep disorder, unspecified: Secondary | ICD-10-CM | POA: Diagnosis present

## 2018-10-02 DIAGNOSIS — F200 Paranoid schizophrenia: Secondary | ICD-10-CM | POA: Diagnosis not present

## 2018-10-02 DIAGNOSIS — F419 Anxiety disorder, unspecified: Secondary | ICD-10-CM | POA: Diagnosis present

## 2018-10-02 DIAGNOSIS — Z590 Homelessness: Secondary | ICD-10-CM

## 2018-10-02 DIAGNOSIS — F515 Nightmare disorder: Secondary | ICD-10-CM | POA: Diagnosis present

## 2018-10-02 DIAGNOSIS — G4700 Insomnia, unspecified: Secondary | ICD-10-CM | POA: Diagnosis present

## 2018-10-02 DIAGNOSIS — Z20828 Contact with and (suspected) exposure to other viral communicable diseases: Secondary | ICD-10-CM | POA: Diagnosis present

## 2018-10-02 DIAGNOSIS — F17210 Nicotine dependence, cigarettes, uncomplicated: Secondary | ICD-10-CM | POA: Diagnosis present

## 2018-10-02 DIAGNOSIS — F209 Schizophrenia, unspecified: Secondary | ICD-10-CM | POA: Diagnosis not present

## 2018-10-02 LAB — COMPREHENSIVE METABOLIC PANEL
ALT: 27 U/L (ref 0–44)
AST: 25 U/L (ref 15–41)
Albumin: 4.2 g/dL (ref 3.5–5.0)
Alkaline Phosphatase: 86 U/L (ref 38–126)
Anion gap: 15 (ref 5–15)
BUN: 12 mg/dL (ref 6–20)
CO2: 23 mmol/L (ref 22–32)
Calcium: 9.3 mg/dL (ref 8.9–10.3)
Chloride: 102 mmol/L (ref 98–111)
Creatinine, Ser: 1.02 mg/dL (ref 0.61–1.24)
GFR calc Af Amer: >60 mL/min (ref >60)
GFR calc non Af Amer: >60 mL/min (ref >60)
Glucose, Bld: 102 mg/dL — ABNORMAL HIGH (ref 70–99)
Potassium: 4 mmol/L (ref 3.5–5.1)
Sodium: 140 mmol/L (ref 135–145)
Total Bilirubin: 0.7 mg/dL (ref 0.3–1.2)
Total Protein: 7.6 g/dL (ref 6.5–8.1)

## 2018-10-02 LAB — CBC
HCT: 47.5 % (ref 39.0–52.0)
Hemoglobin: 16 g/dL (ref 13.0–17.0)
MCH: 31.4 pg (ref 26.0–34.0)
MCHC: 33.7 g/dL (ref 30.0–36.0)
MCV: 93.1 fL (ref 80.0–100.0)
Platelets: 239 10*3/uL (ref 150–400)
RBC: 5.1 MIL/uL (ref 4.22–5.81)
RDW: 15 % (ref 11.5–15.5)
WBC: 8.6 10*3/uL (ref 4.0–10.5)
nRBC: 0 % (ref 0.0–0.2)

## 2018-10-02 LAB — SARS CORONAVIRUS 2 (TAT 6-24 HRS): SARS Coronavirus 2: NEGATIVE

## 2018-10-02 LAB — RAPID URINE DRUG SCREEN, HOSP PERFORMED
Amphetamines: NOT DETECTED
Barbiturates: NOT DETECTED
Benzodiazepines: NOT DETECTED
Cocaine: NOT DETECTED
Opiates: NOT DETECTED
Tetrahydrocannabinol: NOT DETECTED

## 2018-10-02 LAB — ETHANOL: Alcohol, Ethyl (B): 100 mg/dL — ABNORMAL HIGH (ref <10)

## 2018-10-02 LAB — ACETAMINOPHEN LEVEL: Acetaminophen (Tylenol), Serum: <10 ug/mL — ABNORMAL LOW (ref 10–30)

## 2018-10-02 LAB — SALICYLATE LEVEL: Salicylate Lvl: <7 mg/dL (ref 2.8–30.0)

## 2018-10-02 MED ORDER — ACETAMINOPHEN 325 MG PO TABS
650.0000 mg | ORAL_TABLET | Freq: Once | ORAL | Status: AC
  Administered 2018-10-02: 650 mg via ORAL
  Filled 2018-10-02: qty 2

## 2018-10-02 MED ORDER — PRAZOSIN HCL 5 MG PO CAPS
5.0000 mg | ORAL_CAPSULE | Freq: Every day | ORAL | Status: DC
  Administered 2018-10-02 – 2018-10-06 (×5): 5 mg via ORAL
  Filled 2018-10-02 (×7): qty 1

## 2018-10-02 MED ORDER — PRAZOSIN HCL 1 MG PO CAPS
ORAL_CAPSULE | ORAL | Status: AC
  Filled 2018-10-02: qty 5

## 2018-10-02 MED ORDER — HALOPERIDOL 5 MG PO TABS
15.0000 mg | ORAL_TABLET | Freq: Every day | ORAL | Status: DC
  Administered 2018-10-02 – 2018-10-03 (×2): 15 mg via ORAL
  Filled 2018-10-02 (×4): qty 3

## 2018-10-02 MED ORDER — BENZTROPINE MESYLATE 1 MG PO TABS
1.0000 mg | ORAL_TABLET | Freq: Two times a day (BID) | ORAL | Status: DC
  Administered 2018-10-02 – 2018-10-07 (×10): 1 mg via ORAL
  Filled 2018-10-02 (×15): qty 1

## 2018-10-02 MED ORDER — FLUOXETINE HCL 20 MG PO CAPS
20.0000 mg | ORAL_CAPSULE | Freq: Every day | ORAL | Status: DC
  Administered 2018-10-02 – 2018-10-06 (×5): 20 mg via ORAL
  Filled 2018-10-02 (×8): qty 1

## 2018-10-02 MED ORDER — TRAZODONE HCL 100 MG PO TABS
100.0000 mg | ORAL_TABLET | Freq: Every evening | ORAL | Status: DC | PRN
  Administered 2018-10-02 – 2018-10-04 (×3): 100 mg via ORAL
  Filled 2018-10-02 (×3): qty 1
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
## 2018-10-02 NOTE — ED Notes (Signed)
No respiratory or acute distress noted alert and oriented x 3 clear speech noted moves all extremities steady gait noted complains of headache.

## 2018-10-02 NOTE — BHH Counselor (Signed)
  BH ASSESSMENT DISPOSITION  Disposition: [facility] discussed case with [place] Provider, Earleen Newport, NP who recommends inpatient treatment.  TTS will look for inpatient placement.  Mitsuo Budnick L. [place], [place], [facility], [facility] Therapeutic Triage Specialist  [phone]

## 2018-10-02 NOTE — ED Notes (Signed)
No respiratory or acute distress noted alert and oriented x 3 clear speech noted steady gait noted moves all extremities.

## 2018-10-02 NOTE — ED Provider Notes (Signed)
[facility]-EMERGENCY DEPT Provider Note   CSN: [id number] Arrival date & time: 10/01/18  2246     History   Chief Complaint Chief Complaint  Patient presents with  . Suicidal    HPI Edwin Henry is a 58 y.o. male.     The history is provided by the patient and medical records.     58 y.o. M with hx of schizophrenia, alcohol abuse disorder, MDD, bipolar disorder, presenting to the ED for hallucinations.  Patient states he has been hearing voices but 6 months and initially he was handling it fairly well, however lately they have started to tell him to kill himself and he can't deal with it anymore.  States he has a hard time realizing that they are not real.  He also has started seeing things-- namely shadows on the walls in the shapes of people, etc.  He states "I just want to end it all".  Denies HI.  [facility] and is on medications but states "they are not helping".  Past Medical History:  Diagnosis Date  . Schizophrenia [facility](HCC)     Patient Active Problem List   Diagnosis Date Noted  . Schizoaffective disorder, bipolar type (HCC)   . Alcohol use disorder, moderate, dependence (HCC) 08/13/2018  . MDD (major depressive disorder), recurrent, severe, with psychosis (HCC) 05/22/2018  . Hallucinations 10/26/2017  . Schizophrenia, unspecified (HCC) 10/10/2017    Past Surgical History:  Procedure Laterality Date  . KNEE SURGERY          Home Medications    Prior to Admission medications   Medication Sig Start Date End Date Taking? Authorizing Provider  FLUoxetine (PROZAC) 20 MG capsule Take 20 mg by mouth at bedtime.   Yes [provider]  mirtazapine (REMERON) 30 MG tablet Take 1 tablet (30 mg total) by mouth at bedtime. 08/21/18  Yes Malvin JohnsFarah, Brian, MD  prazosin (MINIPRESS) 5 MG capsule Take 5 mg by mouth at bedtime.   Yes [provider]  benztropine (COGENTIN) 1 MG tablet Take 1 tablet (1 mg  total) by mouth 2 (two) times daily. 08/21/18   Malvin JohnsFarah, Brian, MD  haloperidol (HALDOL) 20 MG tablet Take 1 tablet (20 mg total) by mouth at bedtime. 08/21/18   Malvin JohnsFarah, Brian, MD  thiamine 250 MG tablet Take 1 tablet (250 mg total) by mouth daily. 08/21/18   Malvin JohnsFarah, Brian, MD    Family History History reviewed. No pertinent family history.  Social History Social History   Tobacco Use  . Smoking status: Current Every Day Smoker    Packs/day: 0.50  . Smokeless tobacco: Current User    Types: Chew  Substance Use Topics  . Alcohol use: Yes    Comment: 1-2 beers every 2 days  . Drug use: Yes    Types: Marijuana, Cocaine    Comment: occasional THC use     Allergies   Patient has no known allergies.   Review of Systems Review of Systems  Psychiatric/Behavioral: Positive for hallucinations and suicidal ideas.  All other systems reviewed and are negative.    Physical Exam Updated Vital Signs BP 128/78 (BP Location: Left Arm)   Pulse 88   Resp 17   SpO2 98%   Physical Exam Vitals signs and nursing note reviewed.  Constitutional:      Appearance: He is well-developed.  HENT:     Head: Normocephalic and atraumatic.  Eyes:     Conjunctiva/sclera: Conjunctivae normal.  Pupils: Pupils are equal, round, and reactive to light.  Neck:     Musculoskeletal: Normal range of motion.  Cardiovascular:     Rate and Rhythm: Normal rate and regular rhythm.     Heart sounds: Normal heart sounds.  Pulmonary:     Effort: Pulmonary effort is normal.     Breath sounds: Normal breath sounds.  Abdominal:     General: Bowel sounds are normal.     Palpations: Abdomen is soft.  Musculoskeletal: Normal range of motion.  Skin:    General: Skin is warm and dry.  Neurological:     Mental Status: He is alert and oriented to person, place, and time.  Psychiatric:        Attention and Perception: He perceives auditory and visual hallucinations.        Thought Content: Thought content includes  suicidal ideation. Thought content does not include homicidal ideation. Thought content does not include homicidal or suicidal plan.      ED Treatments / Results  Labs (all labs ordered are listed, but only abnormal results are displayed) Labs Reviewed  COMPREHENSIVE METABOLIC PANEL - Abnormal; Notable for the following components:      Result Value   Glucose, Bld 102 (*)    All other components within normal limits  ETHANOL - Abnormal; Notable for the following components:   Alcohol, Ethyl (B) 100 (*)    All other components within normal limits  ACETAMINOPHEN LEVEL - Abnormal; Notable for the following components:   Acetaminophen (Tylenol), Serum <10 (*)    All other components within normal limits  SALICYLATE LEVEL  CBC  RAPID URINE DRUG SCREEN, HOSP PERFORMED    EKG None  Radiology No results found.  Procedures Procedures (including critical care time)  Medications Ordered in ED Medications - No data to display   Initial Impression / Assessment and Plan / ED Course  I have reviewed the triage vital signs and the nursing notes.  Pertinent labs & imaging results that were available during my care of the patient were reviewed by me and considered in my medical decision making (see chart for details).  58 year old male here with hallucinations.  Reports he is hearing voices that tell him to kill himself.  He is also seeing shadows in the shapes of people.  States he cannot tolerate this anymore and wants to kill himself.  He denies any homicidal ideation.  His labs are overall reassuring, ethanol is 100.  He does not appear significantly intoxicated.  Patient is medically cleared.  TTS to evaluate.  6:53 AM TTS evaluation still pending.  Care will be signed out to morning provider to follow-up on TTS recommendations and disposition.  Final Clinical Impressions(s) / ED Diagnoses   Final diagnoses:  Suicidal ideation  Hallucinations    ED Discharge Orders    None        Larene Pickett, PA-C 10/02/18 7035    Edwin Hacker, MD 10/05/18 [phone]

## 2018-10-02 NOTE — ED Notes (Signed)
Informed Dr. Ralene Bathe that pt still has headache and want something else for headache.

## 2018-10-02 NOTE — ED Notes (Signed)
Lunch food tray given. Still complains of headache reported to Dr. Ralene Bathe pt alert and oriented x 3 clear speech noted moves all extremities sitter watching pt.

## 2018-10-02 NOTE — Tx Team (Signed)
Initial Treatment Plan 10/02/2018 6:57 PM Lonell Stamos ZHG:[id number]    PATIENT STRESSORS: Financial difficulties Health problems Medication change or noncompliance Substance abuse   PATIENT STRENGTHS: Active sense of humor General fund of knowledge Motivation for treatment/growth Physical Health Supportive family/friends   PATIENT IDENTIFIED PROBLEMS: "depression"  "I want to jump in front of a car"  "substance abuse"                 DISCHARGE CRITERIA:  Ability to meet basic life and health needs Improved stabilization in mood, thinking, and/or behavior Medical problems require only outpatient monitoring Motivation to continue treatment in a less acute level of care  PRELIMINARY DISCHARGE PLAN: Attend 12-step recovery group Outpatient therapy Return to previous living arrangement  PATIENT/FAMILY INVOLVEMENT: This treatment plan has been presented to and reviewed with the patient, Edwin Henry.  The patient and family have been given the opportunity to ask questions and make suggestions.  Baron Sane, RN 10/02/2018, 6:57 PM

## 2018-10-02 NOTE — Progress Notes (Signed)
D: Pt passive SI/ AVH- contracts for safety. Pt is pleasant and cooperative. Pt visible on the unit this evening. Getting used to his first day at [facility].  A: Pt was offered support and encouragement. Pt was given scheduled medications. Pt was encourage to attend groups. Q 15 minute checks were done for safety.  R:Pt attends groups and interacts well with peers and staff. Pt is taking medication. Pt has no complaints.Pt receptive to treatment and safety maintained on unit.

## 2018-10-03 DIAGNOSIS — F200 Paranoid schizophrenia: Secondary | ICD-10-CM

## 2018-10-03 MED ORDER — ENSURE ENLIVE PO LIQD
237.0000 mL | Freq: Two times a day (BID) | ORAL | Status: DC
  Administered 2018-10-04 – 2018-10-07 (×7): 237 mL via ORAL

## 2018-10-03 MED ORDER — NICOTINE 21 MG/24HR TD PT24
21.0000 mg | MEDICATED_PATCH | Freq: Every day | TRANSDERMAL | Status: DC
  Administered 2018-10-03 – 2018-10-07 (×5): 21 mg via TRANSDERMAL
  Filled 2018-10-03 (×7): qty 1

## 2018-10-03 NOTE — Progress Notes (Signed)
Recreation Therapy Notes  Date: 8.27.20 Time: 1000 Location: 500 Hall Dayroom   Group Topic: Communication, Team Building, Problem Solving  Goal Area(s) Addresses:  Patient will effectively work with peer towards shared goal.  Patient will identify skill used to make activity successful.  Patient will identify how skills used during activity can be used to reach post d/c goals.   Behavioral Response:  None  Intervention: STEM Activity   Activity: [employer]. Patients were provided the following materials: 5 drinking straws, 5 rubber bands, 5 paper clips, 2 index cards and 2 drinking cups. Using the provided materials patients were asked to build a launching mechanisms to launch a ping pong ball approximately 12 feet. Patients were divided into teams of 3-5.   Education: Education officer, community, Dentist.   Education Outcome: Acknowledges education/In group clarification offered/Needs additional education.   Clinical Observations/Feedback: Pt sat and observed as peers participated in activity.     Victorino Sparrow, LRT/CTRS         Ria Comment, Taziah Difatta A 10/03/2018 12:12 PM

## 2018-10-03 NOTE — H&P (Signed)
Psychiatric Admission Assessment Adult  Patient Identification: Edwin Henry MRN:  [id number] Date of Evaluation:  10/03/2018 Chief Complaint:  schizophrenia Principal Diagnosis: Chronic schizophrenia/some component of factitious disorder Diagnosis:  Active Problems:   Schizophrenia (HCC)  History of Present Illness:   Edwin Henry is well-known to the service Edwin Henry was recently here and has had numerous prior admissions and encounters.  Edwin Henry is 57, [facility]  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  At any rate Edwin Henry said Edwin Henry had voices telling him to kill himself and was admitted for stabilization.  Edwin Henry tells me the voices are exclusively inside and not outside his head so they are probably just intrusive thoughts if they are indeed there at all.  His drug screen is negative.  Last discharged on 7/15 at that point in time Edwin Henry told me Edwin Henry wanted to stay hospitalized for housing purposes however now Edwin Henry states Edwin Henry is living with his mother.  His suicidal thoughts are passive no thoughts of harming self or others while here as far specific plans or intent.  Denies current but reports recent voices inside his head.   Associated Signs/Symptoms: Depression Symptoms:  insomnia, (Hypo) Manic Symptoms:  n/a Anxiety Symptoms:  Excessive Worry, Psychotic Symptoms: Probable intrusive thoughts PTSD Symptoms: NA Total Time spent with patient: 45 minutes  Past Psychiatric History: As above  Is the patient at risk to self? No.  Has the patient been a risk to self in the past 6 months? No.  Has the patient been a risk to self within the  distant past? No.  Is the patient a risk to others? No.  Has the patient been a risk to others in the past 6 months? No.  Has the patient been a risk to others within the distant past? No.   Alcohol Screening: 1. How often do you have a drink containing alcohol?: 2 to 3 times a week 2. How many drinks containing alcohol do you have on a typical day when you are drinking?: 3 or 4 3. How often do you have six or more drinks on one occasion?: Never AUDIT-C Score: 4 4. How often during the last year have you found that you were not able to stop drinking once you had started?: Never 5. How often during the last year have you failed to do what was normally expected from you becasue of drinking?: Never 6. How often during the last year have you needed a first drink in the morning to get yourself going after a heavy drinking session?: Never 7. How often during the last year have you had a feeling of guilt of remorse after drinking?: Never 8. How often during the last year have you been unable to remember what happened the night before because you had been drinking?: Never 9. Have you or someone else been injured as a result of your drinking?: No 10. Has a relative or friend or a doctor or another health worker been concerned about your drinking or suggested you cut down?: No Alcohol Use Disorder Identification Test Final Score (AUDIT): 4 Substance Abuse History in the last 12 months:  Yes.   Consequences of Substance  Abuse: NA Previous Psychotropic Medications: Yes  Psychological Evaluations: No  Past Medical History:  Past Medical History:  Diagnosis Date  . Schizophrenia [facility])     Past Surgical History:  Procedure Laterality Date  . KNEE SURGERY     Family History: History reviewed. No pertinent family history. Family Psychiatric  History: no new data Tobacco Screening:   Social History:  Social History   Substance and Sexual Activity  Alcohol Use Yes   Comment: 1-2 beers every 2  days     Social History   Substance and Sexual Activity  Drug Use Yes  . Types: Marijuana   Comment: occasional THC use    Additional Social History:                           Allergies:  No Known Allergies Lab Results:  Results for orders placed or performed during the hospital encounter of 10/01/18 (from the past 48 hour(s))  Comprehensive metabolic panel     Status: Abnormal   Collection Time: 10/01/18 11:42 PM  Result Value Ref Range   Sodium 140 135 - 145 mmol/L    Comment: LIPEMIC SPECIMEN   Potassium 4.0 3.5 - 5.1 mmol/L   Chloride 102 98 - 111 mmol/L   CO2 23 22 - 32 mmol/L   Glucose, Bld 102 (H) 70 - 99 mg/dL   BUN 12 6 - 20 mg/dL   Creatinine, Ser 0.23 0.61 - 1.24 mg/dL   Calcium 9.3 8.9 - 34.3 mg/dL   Total Protein 7.6 6.5 - 8.1 g/dL    Comment: LIPEMIC SPECIMEN POST-ULTRACENTRIFUGATION    Albumin 4.2 3.5 - 5.0 g/dL   AST 25 15 - 41 U/L   ALT 27 0 - 44 U/L   Alkaline Phosphatase 86 38 - 126 U/L   Total Bilirubin 0.7 0.3 - 1.2 mg/dL   GFR calc non Af Amer >60 >60 mL/min   GFR calc Af Amer >60 >60 mL/min   Anion gap 15 5 - 15    Comment: Performed at [facility], [address], [place], [place] [address]  Ethanol     Status: Abnormal   Collection Time: 10/01/18 11:42 PM  Result Value Ref Range   Alcohol, Ethyl (B) 100 (H) <10 mg/dL    Comment: (NOTE) Lowest detectable limit for serum alcohol is 10 mg/dL. For medical purposes only. Performed at [facility], [address], [place], [place] [address]   Salicylate level     Status: None   Collection Time: 10/01/18 11:42 PM  Result Value Ref Range   Salicylate Lvl <7.0 2.8 - 30.0 mg/dL    Comment: Performed at [facility], [address] [place], [place] [address]  Acetaminophen level     Status: Abnormal   Collection Time: 10/01/18 11:42 PM  Result Value Ref Range   Acetaminophen (Tylenol), Serum <10 (L) 10 - 30 ug/mL    Comment: (NOTE) Therapeutic concentrations vary  significantly. A range of 10-30 ug/mL  may be an effective concentration for many patients. However, some  are best treated at concentrations outside of this range. Acetaminophen concentrations >150 ug/mL at 4 hours after ingestion  and >50 ug/mL at 12 hours after ingestion are often associated with  toxic reactions. Performed at [facility], [address], [place], [place] [address]   cbc     Status: None   Collection Time: 10/01/18 11:42 PM  Result  Value Ref Range   WBC 8.6 4.0 - 10.5 K/uL   RBC 5.10 4.22 - 5.81 MIL/uL   Hemoglobin 16.0 13.0 - 17.0 g/dL   HCT 40.9 81.1 - 91.4 %   MCV 93.1 80.0 - 100.0 fL   MCH 31.4 26.0 - 34.0 pg   MCHC 33.7 30.0 - 36.0 g/dL   RDW 78.2 95.6 - 21.3 %   Platelets 239 150 - 400 K/uL   nRBC 0.0 0.0 - 0.2 %    Comment: Performed at [facility], [address], [place], [place] [address]  SARS CORONAVIRUS 2 (TAT 6-12 HRS) Nasal Swab Aptima Multi Swab     Status: None   Collection Time: 10/02/18  9:22 AM   Specimen: Aptima Multi Swab; Nasal Swab  Result Value Ref Range   SARS Coronavirus 2 NEGATIVE NEGATIVE    Comment: (NOTE) SARS-CoV-2 target nucleic acids are NOT DETECTED. The SARS-CoV-2 RNA is generally detectable in upper and lower respiratory specimens during the acute phase of infection. Negative results do not preclude SARS-CoV-2 infection, do not rule out co-infections with other pathogens, and should not be used as the sole basis for treatment or other patient management decisions. Negative results must be combined with clinical observations, patient history, and epidemiological information. The expected result is Negative. Fact Sheet for Patients: HairSlick.no Fact Sheet for Healthcare Providers: quierodirigir.com This test is not yet approved or cleared by the [place] FDA and  has been authorized for detection and/or diagnosis of SARS-CoV-2 by FDA  under an Emergency Use Authorization (EUA). This EUA will remain  in effect (meaning this test can be used) for the duration of the COVID-19 declaration under Section 56 4(b)(1) of the Act, 21 U.S.C. section 360bbb-3(b)(1), unless the authorization is terminated or revoked sooner. Performed at [facility] Lab, [address], [place], [place] [address]   Rapid urine drug screen (hospital performed)     Status: None   Collection Time: 10/02/18 10:57 AM  Result Value Ref Range   Opiates NONE DETECTED NONE DETECTED   Cocaine NONE DETECTED NONE DETECTED   Benzodiazepines NONE DETECTED NONE DETECTED   Amphetamines NONE DETECTED NONE DETECTED   Tetrahydrocannabinol NONE DETECTED NONE DETECTED   Barbiturates NONE DETECTED NONE DETECTED    Comment: (NOTE) DRUG SCREEN FOR MEDICAL PURPOSES ONLY.  IF CONFIRMATION IS NEEDED FOR ANY PURPOSE, NOTIFY LAB WITHIN 5 DAYS. LOWEST DETECTABLE LIMITS FOR URINE DRUG SCREEN Drug Class                     Cutoff (ng/mL) Amphetamine and metabolites    1000 Barbiturate and metabolites    200 Benzodiazepine                 200 Tricyclics and metabolites     300 Opiates and metabolites        300 Cocaine and metabolites        300 THC                            50 Performed at [facility], [address], [place], [place] [address]     Blood Alcohol level:  Lab Results  Component Value Date   ETH 100 (H) 10/01/2018   ETH <10 08/13/2018    Metabolic Disorder Labs:  Lab Results  Component Value Date   HGBA1C 5.4 08/13/2018   MPG 108.28 08/13/2018   MPG 111.15  10/11/2017   Lab Results  Component Value Date   PROLACTIN 22.1 (H) 10/11/2017   Lab Results  Component Value Date   CHOL 235 (H) 08/13/2018   TRIG 210 (H) 08/13/2018   HDL 41 08/13/2018   CHOLHDL 5.7 08/13/2018   VLDL 42 (H) 08/13/2018   LDLCALC 152 (H) 08/13/2018   LDLCALC UNABLE TO CALCULATE IF TRIGLYCERIDE OVER 400 mg/dL 40/98/119109/06/2017    Current  Medications: Current Facility-Administered Medications  Medication Dose Route Frequency Provider Last Rate Last Dose  . prazosin (MINIPRESS) 1 MG capsule           . benztropine (COGENTIN) tablet 1 mg  1 mg Oral BID Rankin, Shuvon B, NP   1 mg at 10/03/18 0731  . FLUoxetine (PROZAC) capsule 20 mg  20 mg Oral QHS Rankin, Shuvon B, NP   20 mg at 10/02/18 2100  . haloperidol (HALDOL) tablet 15 mg  15 mg Oral QHS Rankin, Shuvon B, NP   15 mg at 10/02/18 2059  . prazosin (MINIPRESS) capsule 5 mg  5 mg Oral QHS Rankin, Shuvon B, NP   5 mg at 10/02/18 2059  . traZODone (DESYREL) tablet 100 mg  100 mg Oral QHS PRN Jearld Leschixon, Rashaun M, NP   100 mg at 10/02/18 2100   PTA Medications: Medications Prior to Admission  Medication Sig Dispense Refill Last Dose  . benztropine (COGENTIN) 1 MG tablet Take 1 tablet (1 mg total) by mouth 2 (two) times daily. 60 tablet 2   . FLUoxetine (PROZAC) 20 MG capsule Take 20 mg by mouth at bedtime.     . haloperidol (HALDOL) 20 MG tablet Take 1 tablet (20 mg total) by mouth at bedtime. 30 tablet 2   . mirtazapine (REMERON) 30 MG tablet Take 1 tablet (30 mg total) by mouth at bedtime. 30 tablet 2   . prazosin (MINIPRESS) 5 MG capsule Take 5 mg by mouth at bedtime.     . thiamine 250 MG tablet Take 1 tablet (250 mg total) by mouth daily. 90 tablet 1     Musculoskeletal: Strength & Muscle Tone: within normal limits Gait & Station: normal Patient leans: N/A  Psychiatric Specialty Exam: Physical Exam  Nursing note and vitals reviewed. Constitutional: Edwin Henry appears well-developed and well-nourished.  HENT:  Head: Normocephalic and atraumatic.  Cardiovascular: Normal rate and regular rhythm.    Review of Systems  Constitutional: Negative.   Eyes: Negative.   Gastrointestinal: Negative.   Neurological: Negative.   Endo/Heme/Allergies: Negative.     Blood pressure 111/71, pulse 90, temperature 97.7 F (36.5 C), temperature source Oral, resp. rate 18, height 6\' 2"  (1.88  m), weight 75.8 kg, SpO2 99 %.Body mass index is 21.44 kg/m.  General Appearance: Casual  Eye Contact:  Good  Speech:  Normal Rate  Volume:  Decreased  Mood:  Dysphoric  Affect:  Congruent  Thought Process:  Linear and Descriptions of Associations: Intact  Orientation:  Full (Time, Place, and Person)  Thought Content:  Logical  Suicidal Thoughts:  No  Homicidal Thoughts:  No  Memory:  Immediate;   Fair Recent;   Fair  Judgement:  Fair  Insight:  Fair  Psychomotor Activity:  Normal  Concentration:  Concentration: Fair and Attention Span: Fair  Recall:  [employer]  Fund of Knowledge:  Fair  Language:  Fair  Akathisia:  Negative  Handed:  Right  AIMS (if indicated):     Assets:  Communication Skills Desire for Improvement  ADL's:  Intact  Cognition:  WNL  Sleep:  Number of Hours: 6.75    Treatment Plan Summary: Daily contact with patient to assess and evaluate symptoms and progress in treatment and Medication management  Observation Level/Precautions:  15 minute checks  Laboratory:  UDS  Psychotherapy: Cognitive-based  Medications: Restart Haldol and Prozac at his request  Consultations: None necessary  Discharge Concerns: Compliance with outpatient care  Estimated LOS: 5-7  Other: Axis I schizophrenia by history/some component of factitious disorder/reports intermittent substance abuse but drug screen negative 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 Axis III medically stable blood pressure normal   Physician Treatment Plan for Primary Diagnosis: <principal problem not specified> Long Term Goal(s): Improvement in symptoms so as ready for discharge  Short Term Goals: Ability to verbalize feelings will improve, Ability to disclose and discuss suicidal ideas, Ability to demonstrate self-control will improve and Ability to identify and develop effective coping behaviors will improve  Physician Treatment Plan for Secondary Diagnosis: Active Problems:   Schizophrenia  ([place])  Long Term Goal(s): Improvement in symptoms so as ready for discharge  Short Term Goals: Ability to identify and develop effective coping behaviors will improve, Ability to maintain clinical measurements within normal limits will improve, Compliance with prescribed medications will improve and Ability to identify triggers associated with substance abuse/mental health issues will improve  I certify that inpatient services furnished can reasonably be expected to improve the patient's condition.    Johnn Hai, MD 8/27/20208:05 AM
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
## 2018-10-03 NOTE — BHH Group Notes (Signed)
LCSW Group Therapy Note  Date and Time: 10/03/2018 @ 1:30pm  Type of Therapy and Topic: Group Therapy: Feelings Around Returning Home & Establishing a Supportive Framework and Supporting Oneself When Supports Not Available  Participation Level: BHH PARTICIPATION LEVEL: Did Not Attend  Mood: Did not attend     Description of Group:  Patients first processed thoughts and feelings about upcoming discharge. These included fears of upcoming changes, lack of change, new living environments, judgements and expectations from others and overall stigma of mental health issues. The group then discussed the definition of a supportive framework, what that looks and feels like, and how do to discern it from an unhealthy non-supportive network. The group identified different types of supports as well as what to do when your family/friends are less than helpful or unavailable     Therapeutic Goals   1.  Patient will identify one healthy supportive network that they can use at discharge.  2.  Patient will identify one factor of a supportive framework and how to tell it from an unhealthy network.  3.  Patient able to identify one coping skill to use when they do not have positive supports from others.  4.  Patient will demonstrate ability to communicate their needs through discussion and/or role plays.     Summary of Patient Progress:       Patient did not attend group today. Announcement was made over the intercom for all patients for SW group.      Therapeutic Modalities  Cognitive Behavioral Therapy  Motivational Interviewing   Jacqlyn Marolf, MSW, LCSW  

## 2018-10-03 NOTE — BHH Suicide Risk Assessment (Signed)
[facility] Admission Suicide Risk Assessment   Nursing information obtained from:  Patient Demographic factors:  Male, Low socioeconomic status, Unemployed Current Mental Status:  Suicidal ideation indicated by patient, Intention to act on suicide plan, Suicide plan, Belief that plan would result in death, Self-harm thoughts Loss Factors:  Decline in physical health, Financial problems / change in socioeconomic status Historical Factors:  Prior suicide attempts, Family history of mental illness or substance abuse, Impulsivity Risk Reduction Factors:  Positive coping skills or problem solving skills, Living with another person, especially a relative, Sense of responsibility to family, Positive social support, Positive therapeutic relationship  Total Time spent with patient: 45 minutes Principal Problem: <principal problem not specified> Diagnosis:  Active Problems:   Schizophrenia ([place])  Subjective Data: Readmitted from EMS  Continued Clinical Symptoms:  Alcohol Use Disorder Identification Test Final Score (AUDIT): 4 The "Alcohol Use Disorders Identification Test", Guidelines for Use in Primary Care, Second Edition.  World Pharmacologist [facility]). Score between 0-7:  no or low risk or alcohol related problems. Score between 8-15:  moderate risk of alcohol related problems. Score between 16-19:  high risk of alcohol related problems. Score 20 or above:  warrants further diagnostic evaluation for alcohol dependence and treatment.   CLINICAL FACTORS:   Dysthymia   Musculoskeletal: Strength & Muscle Tone: within normal limits Gait & Station: normal Patient leans: N/A  Psychiatric Specialty Exam: Physical Exam  Nursing note and vitals reviewed. Constitutional: He appears well-developed and well-nourished.  HENT:  Head: Normocephalic and atraumatic.  Cardiovascular: Normal rate and regular rhythm.    Review of Systems  Constitutional: Negative.   Eyes: Negative.   Gastrointestinal:  Negative.   Neurological: Negative.   Endo/Heme/Allergies: Negative.     Blood pressure 111/71, pulse 90, temperature 97.7 F (36.5 C), temperature source Oral, resp. rate 18, height 6\' 2"  (1.88 m), weight 75.8 kg, SpO2 99 %.Body mass index is 21.44 kg/m.  General Appearance: Casual  Eye Contact:  Good  Speech:  Normal Rate  Volume:  Decreased  Mood:  Dysphoric  Affect:  Congruent  Thought Process:  Linear and Descriptions of Associations: Intact  Orientation:  Full (Time, Place, and Person)  Thought Content:  Logical  Suicidal Thoughts:  No  Homicidal Thoughts:  No  Memory:  Immediate;   Fair Recent;   Fair  Judgement:  Fair  Insight:  Fair  Psychomotor Activity:  Normal  Concentration:  Concentration: Fair and Attention Span: Fair  Recall:  [employer] of Knowledge:  Fair  Language:  Fair  Akathisia:  Negative  Handed:  Right  AIMS (if indicated):     Assets:  Communication Skills Desire for Improvement  ADL's:  Intact  Cognition:  WNL  Sleep:  Number of Hours: 6.75    COGNITIVE FEATURES THAT CONTRIBUTE TO RISK:  Loss of executive function    SUICIDE RISK:   Minimal: No identifiable suicidal ideation.  Patients presenting with no risk factors but with morbid ruminations; may be classified as minimal risk based on the severity of the depressive symptoms  PLAN OF CARE: see eval-resume meds of request  I certify that inpatient services furnished can reasonably be expected to improve the patient's condition.   Johnn Hai, MD 10/03/2018, 8:15 AM

## 2018-10-03 NOTE — Progress Notes (Signed)
Recreation Therapy Notes  INPATIENT RECREATION THERAPY ASSESSMENT  Patient Details Name: Gar Glance MRN: [id number] DOB: [birth year] Today's Date: 10/03/2018       Information Obtained From: Patient  Able to Participate in Assessment/Interview: Yes  Patient Presentation: Alert  Reason for Admission (Per Patient): Other (Comments)(Hearing voices)  Patient Stressors: Other (Comment)(Voices; Not sleeping)  Coping Skills:   TV, Sports, Isolation, Music, Deep Breathing, Meditate  Leisure Interests (2+):  (Pt stated he hasn't been doing anything because of the voices.)  Frequency of Recreation/Participation:    Awareness of Community Resources:  Yes  Community Resources:  [employer]  Current Use: Yes  If no, Barriers?:    Expressed Interest in [place]: No  County of Residence:  [place]  Patient Main Form of Transportation: Diplomatic Services operational officer  Patient Strengths:  Kind; Use to love to fish  Patient Identified Areas of Improvement:  Voices; Get life back on track  Patient Goal for Hospitalization:  "get voices under control"  Current SI (including self-harm):  No  Current HI:  No  Current AVH: Yes(Voices telling him to walk in front of a car and seeing moving shadows that aren't there.)  Staff Intervention Plan: Group Attendance, Collaborate with Interdisciplinary Treatment Team  Consent to Intern Participation: N/A   Victorino Sparrow, LRT/CTRS  Victorino Sparrow A 10/03/2018, 12:43 PM

## 2018-10-03 NOTE — Plan of Care (Signed)
Progress note  D: pt found in bed; compliant with medication administration. Pt states his voices are still there and he needs PRN medications to control these better. Pt has rested most of the day. Pt is pleasant and approachable. Pt denies any physical complaints or pain. Pt denies si/hi/ah/vh and verbally agrees to approach staff if these become apparent or before harming himself/others while at [place]: Pt provided support and encouragement. Pt given medication per protocol and standing orders. Q12m safety checks implemented and continued.  R: Pt safe on the unit. Will continue to monitor.  Pt progressing in the following metrics  Problem: Education: Goal: Knowledge of [facility] Education information/materials will improve Outcome: Progressing Goal: Emotional status will improve Outcome: Progressing Goal: Mental status will improve Outcome: Progressing Goal: Verbalization of understanding the information provided will improve Outcome: Progressing

## 2018-10-03 NOTE — BHH Suicide Risk Assessment (Signed)
[place] INPATIENT:  Family/Significant Other Suicide Prevention Education  Suicide Prevention Education:  Patient Refusal for Family/Significant Other Suicide Prevention Education: The patient Edwin Henry has refused to provide written consent for family/significant other to be provided Family/Significant Other Suicide Prevention Education during admission and/or prior to discharge.  Physician notified.  Trecia Rogers 10/03/2018, 11:26 AM

## 2018-10-03 NOTE — Progress Notes (Signed)
Adult Psychoeducational Group Note  Date:  10/03/2018 Time:  8:50 PM  Group Topic/Focus:  Wrap-Up Group:   The focus of this group is to help patients review their daily goal of treatment and discuss progress on daily workbooks.  Participation Level:  Active  Participation Quality:  Appropriate  Affect:  Appropriate  Cognitive:  Oriented  Insight: Improving  Engagement in Group:  Engaged  Modes of Intervention:  Education and Support  Additional Comments:  Patient attended and participated in group tonight. He report that today he did nothing special. He went to the [facility], spoke with his doctor,but did not have enough time to speak with him about his medication.  Today he went for his meals and attended groups.  Salley Scarlet [facility] 10/03/2018, 8:50 PM

## 2018-10-04 MED ORDER — TEMAZEPAM 30 MG PO CAPS
30.0000 mg | ORAL_CAPSULE | Freq: Every day | ORAL | Status: DC
  Administered 2018-10-04 – 2018-10-06 (×3): 30 mg via ORAL
  Filled 2018-10-04 (×3): qty 1

## 2018-10-04 MED ORDER — TRAZODONE HCL 100 MG PO TABS
100.0000 mg | ORAL_TABLET | Freq: Once | ORAL | Status: AC
  Administered 2018-10-04: 100 mg via ORAL
  Filled 2018-10-04 (×2): qty 1

## 2018-10-04 MED ORDER — HALOPERIDOL 5 MG PO TABS
7.0000 mg | ORAL_TABLET | Freq: Three times a day (TID) | ORAL | Status: DC
  Administered 2018-10-04 – 2018-10-07 (×9): 7 mg via ORAL
  Filled 2018-10-04 (×15): qty 1

## 2018-10-04 NOTE — Progress Notes (Signed)
[facility] MD Progress Note  10/04/2018 7:54 AM Edwin Henry  MRN:  [id number] Subjective:    Edwin Henry is well-known to the service, he is a 58 year old who carries a diagnosis of a schizophrenic type condition, however has more recently displayed factitious symptoms, being highly dependent upon hospital services and hospital stays and always resisting discharge.  He has however staying with his mother so he is not homeless at this point in time.  Once again he reports he is not ready to go home that he needs more days to "let the medicine start working" he has given variable reports about his compliance at home.  At any rate he refuses to leave today but he denies thoughts of harming self or others and can contract for safety only here.  Voice describes auditory hallucinations as inside his head more reflective of internal thoughts/ruminations Principal Problem: Factitious complaints Diagnosis: Active Problems:   Schizophrenia ([facility])  Total Time spent with patient: 20 minutes  Past Psychiatric History: Extensive, here this summer as well  Past Medical History:  Past Medical History:  Diagnosis Date  . Schizophrenia [facility])     Past Surgical History:  Procedure Laterality Date  . KNEE SURGERY     Family History: History reviewed. No pertinent family history. Family Psychiatric  History: no new data Social History:  Social History   Substance and Sexual Activity  Alcohol Use Yes   Comment: 1-2 beers every 2 days     Social History   Substance and Sexual Activity  Drug Use Yes  . Types: Marijuana   Comment: occasional THC use    Social History   Socioeconomic History  . Marital status: Single    Spouse name: Not on file  . Number of children: Not on file  . Years of education: Not on file  . Highest education level: Not on file  Occupational History  . Not on file  Social Needs  . Financial resource strain: Not on file  . Food insecurity    Worry: Not on file    Inability:  Not on file  . Transportation needs    Medical: Not on file    Non-medical: Not on file  Tobacco Use  . Smoking status: Current Every Day Smoker    Packs/day: 0.50  . Smokeless tobacco: Current User    Types: Chew  Substance and Sexual Activity  . Alcohol use: Yes    Comment: 1-2 beers every 2 days  . Drug use: Yes    Types: Marijuana    Comment: occasional THC use  . Sexual activity: Yes    Birth control/protection: Condom  Lifestyle  . Physical activity    Days per week: Not on file    Minutes per session: Not on file  . Stress: Not on file  Relationships  . Social Musician on phone: Not on file    Gets together: Not on file    Attends religious service: Not on file    Active member of club or organization: Not on file    Attends meetings of clubs or organizations: Not on file    Relationship status: Not on file  Other Topics Concern  . Not on file  Social History Narrative  . Not on file   Additional Social History:                         Sleep: Good  Appetite:  Good  Current Medications:  Current Facility-Administered Medications  Medication Dose Route Frequency Provider Last Rate Last Dose  . benztropine (COGENTIN) tablet 1 mg  1 mg Oral BID Rankin, Shuvon B, NP   1 mg at 10/03/18 1703  . feeding supplement (ENSURE ENLIVE) (ENSURE ENLIVE) liquid 237 mL  237 mL Oral BID BM Johnn Hai, MD      . FLUoxetine (PROZAC) capsule 20 mg  20 mg Oral QHS Rankin, Shuvon B, NP   20 mg at 10/03/18 2114  . haloperidol (HALDOL) tablet 15 mg  15 mg Oral QHS Rankin, Shuvon B, NP   15 mg at 10/03/18 2114  . nicotine (NICODERM CQ - dosed in mg/24 hours) patch 21 mg  21 mg Transdermal Daily Johnn Hai, MD   21 mg at 10/03/18 1703  . prazosin (MINIPRESS) capsule 5 mg  5 mg Oral QHS Rankin, Shuvon B, NP   5 mg at 10/03/18 2114  . traZODone (DESYREL) tablet 100 mg  100 mg Oral QHS PRN Deloria Lair, NP   100 mg at 10/03/18 2113    Lab Results:  Results  for orders placed or performed during the hospital encounter of 10/01/18 (from the past 48 hour(s))  SARS CORONAVIRUS 2 (TAT 6-12 HRS) Nasal Swab Aptima Multi Swab     Status: None   Collection Time: 10/02/18  9:22 AM   Specimen: Aptima Multi Swab; Nasal Swab  Result Value Ref Range   SARS Coronavirus 2 NEGATIVE NEGATIVE    Comment: (NOTE) SARS-CoV-2 target nucleic acids are NOT DETECTED. The SARS-CoV-2 RNA is generally detectable in upper and lower respiratory specimens during the acute phase of infection. Negative results do not preclude SARS-CoV-2 infection, do not rule out co-infections with other pathogens, and should not be used as the sole basis for treatment or other patient management decisions. Negative results must be combined with clinical observations, patient history, and epidemiological information. The expected result is Negative. Fact Sheet for Patients: SugarRoll.be Fact Sheet for Healthcare Providers: https://www.woods-mathews.com/ This test is not yet approved or cleared by the [place] FDA and  has been authorized for detection and/or diagnosis of SARS-CoV-2 by FDA under an Emergency Use Authorization (EUA). This EUA will remain  in effect (meaning this test can be used) for the duration of the COVID-19 declaration under Section 56 4(b)(1) of the Act, 21 U.S.C. section 360bbb-3(b)(1), unless the authorization is terminated or revoked sooner. Performed at [facility], [place] [address], [place], [place] [address]   Rapid urine drug screen (hospital performed)     Status: None   Collection Time: 10/02/18 10:57 AM  Result Value Ref Range   Opiates NONE DETECTED NONE DETECTED   Cocaine NONE DETECTED NONE DETECTED   Benzodiazepines NONE DETECTED NONE DETECTED   Amphetamines NONE DETECTED NONE DETECTED   Tetrahydrocannabinol NONE DETECTED NONE DETECTED   Barbiturates NONE DETECTED NONE DETECTED    Comment:  (NOTE) DRUG SCREEN FOR MEDICAL PURPOSES ONLY.  IF CONFIRMATION IS NEEDED FOR ANY PURPOSE, NOTIFY LAB WITHIN 5 DAYS. LOWEST DETECTABLE LIMITS FOR URINE DRUG SCREEN Drug Class                     Cutoff (ng/mL) Amphetamine and metabolites    1000 Barbiturate and metabolites    200 Benzodiazepine                 053 Tricyclics and metabolites     300 Opiates and metabolites        300  Cocaine and metabolites        300 THC                            50 Performed at [facility], [address] [place], [place] [address]     Blood Alcohol level:  Lab Results  Component Value Date   ETH 100 (H) 10/01/2018   ETH <10 08/13/2018    Metabolic Disorder Labs: Lab Results  Component Value Date   HGBA1C 5.4 08/13/2018   MPG 108.28 08/13/2018   MPG 111.15 10/11/2017   Lab Results  Component Value Date   PROLACTIN 22.1 (H) 10/11/2017   Lab Results  Component Value Date   CHOL 235 (H) 08/13/2018   TRIG 210 (H) 08/13/2018   HDL 41 08/13/2018   CHOLHDL 5.7 08/13/2018   VLDL 42 (H) 08/13/2018   LDLCALC 152 (H) 08/13/2018   LDLCALC UNABLE TO CALCULATE IF TRIGLYCERIDE OVER 400 mg/dL 60/45/409809/06/2017    Physical Findings: AIMS: Facial and Oral Movements Muscles of Facial Expression: None, normal Lips and Perioral Area: None, normal Jaw: None, normal Tongue: None, normal,Extremity Movements Upper (arms, wrists, hands, fingers): None, normal Lower (legs, knees, ankles, toes): None, normal, Trunk Movements Neck, shoulders, hips: None, normal, Overall Severity Severity of abnormal movements (highest score from questions above): None, normal Incapacitation due to abnormal movements: None, normal Patient's awareness of abnormal movements (rate only patient's report): No Awareness, Dental Status Current problems with teeth and/or dentures?: No Does patient usually wear dentures?: No  CIWA:    COWS:     Musculoskeletal: Strength & Muscle Tone: within normal  limits Gait & Station: normal Patient leans: N/A  Psychiatric Specialty Exam: Physical Exam  ROS  Blood pressure (!) 93/59, pulse 85, temperature 97.8 F (36.6 C), temperature source Oral, resp. rate 20, height 6\' 2"  (1.88 m), weight 75.8 kg, SpO2 100 %.Body mass index is 21.44 kg/m.  General Appearance: Casual  Eye Contact:  Fair  Speech:  Clear and Coherent  Volume:  Decreased  Mood:  Dysphoric  Affect:  Blunt  Thought Process:  Coherent and Descriptions of Associations: Intact  Orientation:  Full (Time, Place, and Person)  Thought Content:  Rumination  Suicidal Thoughts:  No  Homicidal Thoughts:  No  Memory:  Immediate;   Fair Recent;   Fair Remote;   Fair  Judgement:  Fair  Insight:  Fair  Psychomotor Activity:  Normal  Concentration:  Concentration: Fair  Recall:  [employer]  Fund of Knowledge:  Fair  Language:  Fair  Akathisia:  Negative  Handed:  Right  AIMS (if indicated):     Assets:  Leisure Time Physical Health  ADL's:  Intact  Cognition:  WNL  Sleep:  Number of Hours: 6.75     Treatment Plan Summary: Daily contact with patient to assess and evaluate symptoms and progress in treatment, Medication management and Plan Continue current cognitive and reality based therapies continue current medications without change probable discharge by Monday but no change in precautions basic risk benefits and side effects discussed with patient as her disease before.  Malvin JohnsFARAH,Lakeshia Dohner, MD 10/04/2018, 7:54 AM

## 2018-10-04 NOTE — Progress Notes (Signed)
Recreation Therapy Notes  Date: 8.28.20 Time: 1000 Location: 500 Hall Dayroom  Group Topic: Communication  Goal Area(s) Addresses:  Patient will effectively communicate with peers in group.  Patient will verbalize benefit of healthy communication. Patient will verbalize positive effect of healthy communication on post d/c goals.  Patient will identify communication techniques that made activity effective for group.   Intervention:  Paper, pencils, geometrical shape pictures   Activity: Back to Back Drawings.  Due to COVID-19 restrictions, this activity has been modified.  One person from the group was given a picture to describe to the remaining group members.  The remaining patients were to draw the picture as it was described to them.  The group could not as the speaker any detailed questions.  The only question the group could ask of the speaker was, "could you repeat that".  The speaker could describe the picture in as much detail as possible.    Education: Communication, Discharge Planning  Education Outcome: Acknowledges understanding/In group clarification offered/Needs additional education.   Clinical Observations/Feedback:  Pt did not attend group session.     Victorino Sparrow, LRT/CTRS         Ria Comment, Samanda Buske A 10/04/2018 11:21 AM

## 2018-10-04 NOTE — Progress Notes (Signed)
D: Pt passive SI//AH contract for safety, pt visible on the unit this evening.   A: Pt was offered support and encouragement. Pt was given scheduled medications. Pt was encourage to attend groups. Q 15 minute checks were done for safety.   R: safety maintained on unit.

## 2018-10-04 NOTE — BHH Group Notes (Signed)
[facility] LCSW Group Therapy Note  Date/Time: 10/04/2018 @ 11:30am  Type of Therapy/Topic:  Group Therapy:  Feelings about Diagnosis  Participation Level:  Minimal   Mood: Pleasant   Description of Group:    This group will allow patients to explore their thoughts and feelings about diagnoses they have received. Patients will be guided to explore their level of understanding and acceptance of these diagnoses. Facilitator will encourage patients to process their thoughts and feelings about the reactions of others to their diagnosis, and will guide patients in identifying ways to discuss their diagnosis with significant others in their lives. This group will be process-oriented, with patients participating in exploration of their own experiences as well as giving and receiving support and challenge from other group members.   Therapeutic Goals: 1. Patient will demonstrate understanding of diagnosis as evidence by identifying two or more symptoms of the disorder:  2. Patient will be able to express two feelings regarding the diagnosis 3. Patient will demonstrate ability to communicate their needs through discussion and/or role plays  Summary of Patient Progress:   Patient came to group late but was active and engaged when he did come to group. Patient was supportive and encouraging with other group members when he did join.      Therapeutic Modalities:   Cognitive Behavioral Therapy Brief Therapy Feelings Identification   Ardelle Anton, LCSW

## 2018-10-04 NOTE — Progress Notes (Signed)
D:Pt is attending afternoon group with the Chaplain. Pt says that his goal is to get rid of the voices. Pt rates depression as a 9 and anxiety as an 8 on 0-10 scale with 10 being the most. A:Offered support, encouragement and 15 minute checks.  R:Safety maintained on the unit.

## 2018-10-04 NOTE — Tx Team (Signed)
Interdisciplinary Treatment and Diagnostic Plan Update  10/04/2018 Time of Session: 10:10am Edwin Henry MRN: [id number]  Principal Diagnosis: <principal problem not specified>  Secondary Diagnoses: Active Problems:   Schizophrenia ([place])   Current Medications:  Current Facility-Administered Medications  Medication Dose Route Frequency Provider Last Rate Last Dose  . benztropine (COGENTIN) tablet 1 mg  1 mg Oral BID Rankin, Shuvon B, NP   1 mg at 10/04/18 0817  . feeding supplement (ENSURE ENLIVE) (ENSURE ENLIVE) liquid 237 mL  237 mL Oral BID BM Johnn Hai, MD   237 mL at 10/04/18 0817  . FLUoxetine (PROZAC) capsule 20 mg  20 mg Oral QHS Rankin, Shuvon B, NP   20 mg at 10/03/18 2114  . haloperidol (HALDOL) tablet 7 mg  7 mg Oral TID Johnn Hai, MD      . nicotine (NICODERM CQ - dosed in mg/24 hours) patch 21 mg  21 mg Transdermal Daily Johnn Hai, MD   21 mg at 10/04/18 0817  . prazosin (MINIPRESS) capsule 5 mg  5 mg Oral QHS Rankin, Shuvon B, NP   5 mg at 10/03/18 2114  . traZODone (DESYREL) tablet 100 mg  100 mg Oral QHS PRN Deloria Lair, NP   100 mg at 10/03/18 2113   PTA Medications: Medications Prior to Admission  Medication Sig Dispense Refill Last Dose  . benztropine (COGENTIN) 1 MG tablet Take 1 tablet (1 mg total) by mouth 2 (two) times daily. 60 tablet 2   . FLUoxetine (PROZAC) 20 MG capsule Take 20 mg by mouth at bedtime.     . haloperidol (HALDOL) 20 MG tablet Take 1 tablet (20 mg total) by mouth at bedtime. 30 tablet 2   . mirtazapine (REMERON) 30 MG tablet Take 1 tablet (30 mg total) by mouth at bedtime. 30 tablet 2   . prazosin (MINIPRESS) 5 MG capsule Take 5 mg by mouth at bedtime.     . thiamine 250 MG tablet Take 1 tablet (250 mg total) by mouth daily. 90 tablet 1     Patient Stressors: Financial difficulties Health problems Medication change or noncompliance Substance abuse  Patient Strengths: Active sense of humor General fund of  knowledge Motivation for treatment/growth Physical Health Supportive family/friends  Treatment Modalities: Medication Management, Group therapy, Case management,  1 to 1 session with clinician, Psychoeducation, Recreational therapy.   Physician Treatment Plan for Primary Diagnosis: <principal problem not specified> Long Term Goal(s): Improvement in symptoms so as ready for discharge Improvement in symptoms so as ready for discharge   Short Term Goals: Ability to verbalize feelings will improve Ability to disclose and discuss suicidal ideas Ability to demonstrate self-control will improve Ability to identify and develop effective coping behaviors will improve Ability to identify and develop effective coping behaviors will improve Ability to maintain clinical measurements within normal limits will improve Compliance with prescribed medications will improve Ability to identify triggers associated with substance abuse/mental health issues will improve  Medication Management: Evaluate patient's response, side effects, and tolerance of medication regimen.  Therapeutic Interventions: 1 to 1 sessions, Unit Group sessions and Medication administration.  Evaluation of Outcomes: Progressing  Physician Treatment Plan for Secondary Diagnosis: Active Problems:   Schizophrenia ([place])  Long Term Goal(s): Improvement in symptoms so as ready for discharge Improvement in symptoms so as ready for discharge   Short Term Goals: Ability to verbalize feelings will improve Ability to disclose and discuss suicidal ideas Ability to demonstrate self-control will improve Ability to identify and develop effective  coping behaviors will improve Ability to identify and develop effective coping behaviors will improve Ability to maintain clinical measurements within normal limits will improve Compliance with prescribed medications will improve Ability to identify triggers associated with substance abuse/mental  health issues will improve     Medication Management: Evaluate patient's response, side effects, and tolerance of medication regimen.  Therapeutic Interventions: 1 to 1 sessions, Unit Group sessions and Medication administration.  Evaluation of Outcomes: Progressing   RN Treatment Plan for Primary Diagnosis: <principal problem not specified> Long Term Goal(s): Knowledge of disease and therapeutic regimen to maintain health will improve  Short Term Goals: Ability to participate in decision making will improve, Ability to verbalize feelings will improve, Ability to disclose and discuss suicidal ideas, Ability to identify and develop effective coping behaviors will improve and Compliance with prescribed medications will improve  Medication Management: RN will administer medications as ordered by provider, will assess and evaluate patient's response and provide education to patient for prescribed medication. RN will report any adverse and/or side effects to prescribing provider.  Therapeutic Interventions: 1 on 1 counseling sessions, Psychoeducation, Medication administration, Evaluate responses to treatment, Monitor vital signs and CBGs as ordered, Perform/monitor CIWA, COWS, AIMS and Fall Risk screenings as ordered, Perform wound care treatments as ordered.  Evaluation of Outcomes: Progressing   LCSW Treatment Plan for Primary Diagnosis: <principal problem not specified> Long Term Goal(s): Safe transition to appropriate next level of care at discharge, Engage patient in therapeutic group addressing interpersonal concerns.  Short Term Goals: Engage patient in aftercare planning with referrals and resources and Increase skills for wellness and recovery  Therapeutic Interventions: Assess for all discharge needs, 1 to 1 time with Social worker, Explore available resources and support systems, Assess for adequacy in community support network, Educate family and significant other(s) on suicide  prevention, Complete Psychosocial Assessment, Interpersonal group therapy.  Evaluation of Outcomes: Progressing   Progress in Treatment: Attending groups: No. Participating in groups: No. Taking medication as prescribed: Yes. Toleration medication: Yes. Family/Significant other contact made: Yes, individual(s) contacted:  Pt declined; with SPE Patient understands diagnosis: No. Discussing patient identified problems/goals with staff: Yes. Medical problems stabilized or resolved: Yes. Denies suicidal/homicidal ideation: Yes. Issues/concerns per patient self-inventory: No. Other:   New problem(s) identified: No, Describe:  None  New Short Term/Long Term Goal(s): Medication stabilization, elimination of SI thoughts, and development of a comprehensive mental wellness plan.   Patient Goals:  "Get rid of these voices"  Discharge Plan or Barriers: CSW will continue to follow up for appropriate referrals and possible discharge planning  Reason for Continuation of Hospitalization: Hallucinations Medication stabilization  Estimated Length of Stay: 2-3 days   Attendees: Patient: Edwin Henry  10/04/2018   Physician: Dr. Malvin JohnsBrian Farah, MD 10/04/2018   Nursing: Jan, RN 10/04/2018   RN Care Manager: 10/04/2018  Social Worker: Stephannie PetersJasmine Athea Haley, [place] 10/04/2018   Recreational Therapist:  10/04/2018   Other: Intern: Earlyne IbaSophia Nagy, MSW intern 10/04/2018  Other:  10/04/2018   Other: 10/04/2018      Scribe for Treatment Team: Delphia GratesJasmine M Edgardo Petrenko, LCSW 10/04/2018 11:05 AM

## 2018-10-05 DIAGNOSIS — F209 Schizophrenia, unspecified: Principal | ICD-10-CM

## 2018-10-05 MED ORDER — MIRTAZAPINE 15 MG PO TABS
15.0000 mg | ORAL_TABLET | Freq: Every day | ORAL | Status: DC
  Administered 2018-10-05: 15 mg via ORAL
  Filled 2018-10-05 (×3): qty 1

## 2018-10-05 NOTE — Progress Notes (Signed)
Patient ID: Edwin Henry, male   DOB: 09-19-1960, 58 y.o.   MRN: [id number]  [place] NOVEL CORONAVIRUS (COVID-19) DAILY CHECK-OFF SYMPTOMS - answer yes or no to each - every day NO YES  Have you had a fever in the past 24 hours?  . Fever (Temp > 37.80C / 100F) X   Have you had any of these symptoms in the past 24 hours? . New Cough .  Sore Throat  .  Shortness of Breath .  Difficulty Breathing .  Unexplained Body Aches   X   Have you had any one of these symptoms in the past 24 hours not related to allergies?   . Runny Nose .  Nasal Congestion .  Sneezing   X   If you have had runny nose, nasal congestion, sneezing in the past 24 hours, has it worsened?  X   EXPOSURES - check yes or no X   Have you traveled outside the state in the past 14 days?  X   Have you been in contact with someone with a confirmed diagnosis of COVID-19 or PUI in the past 14 days without wearing appropriate PPE?  X   Have you been living in the same home as a person with confirmed diagnosis of COVID-19 or a PUI (household contact)?    X   Have you been diagnosed with COVID-19?    X              What to do next: Answered NO to all: Answered YES to anything:   Proceed with unit schedule Follow the [facility] Inpatient Flowsheet.
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
## 2018-10-05 NOTE — [facility] Group Notes (Signed)
[place] Group Notes:  (Nursing/MHT/Case Management/Adjunct)  Date:  10/05/2018  Time:  0915 am  Type of Therapy:  Nurse Education  Participation Level:  Did Not Attend   Marissa Calamity 10/05/2018

## 2018-10-05 NOTE — Plan of Care (Signed)
  Problem: Coping: Goal: Ability to verbalize frustrations and anger appropriately will improve Outcome: Progressing   Problem: Safety: Goal: Periods of time without injury will increase Outcome: Progressing   D: Pt alert and oriented on the unit. Pt is passive for SI and endorses AVH. Pt attended social worker group this afternoon groups and activities. Pt is pleasant and cooperative. A: Education, support and encouragement provided, q15 minute safety checks remain in effect. Medications administered per MD orders. R: No reactions/side effects to medicine noted. Pt denies any concerns at this time, and verbally contracts for safety. Pt ambulating on the unit with no issues. Pt remains safe on and off the unit.

## 2018-10-05 NOTE — Progress Notes (Signed)
Patient was observed briefly in the dayroom after group and snack before coming to request his medications. He reported that he was ready to get some rest. He reports passive si and verbally contracts for safety. Support given and safety maintained with 15 min checks.

## 2018-10-05 NOTE — Progress Notes (Signed)
Adult Psychoeducational Group Note  Date:  10/05/2018 Time:  12:34 AM  Group Topic/Focus:  Wrap-Up Group:   The focus of this group is to help patients review their daily goal of treatment and discuss progress on daily workbooks.  Participation Level:  Active  Participation Quality:  Appropriate  Affect:  Appropriate  Cognitive:  Appropriate  Insight: Limited  Engagement in Group:  Limited  Modes of Intervention:  Discussion  Additional Comments: Pt stated his goal for today was to focus on his treatment plan. Pt stated he felt he accomplished his goal today. Pt stated his relationship with his family has improved since he was admitted here. Pt stated he felt better about himself today. Pt rated her overall day a 10. Pt stated his appetite was pretty good today. Pt stated his goal for tonight was to get some rest.  Pt did complain about having a head ache tonight. Pt nurse was made aware of the situation. Pt stated he would alert staff if anything changes.   Candy Sledge 10/05/2018, 12:34 AM

## 2018-10-05 NOTE — Progress Notes (Signed)
[facility] MD Progress Note  10/05/2018 10:55 AM Clarence Cogswell  MRN:  [id number] Subjective: Patient is a 58 year old male with a history of auditory hallucinations, major depression, alcohol use disorder and possible schizoaffective disorder.  He was admitted on 08/12/2018-08/21/2018, and readmitted on 10/03/2022 essentially the same complaints.   Objective: Patient is seen and examined.  Patient is a 58 year old male with the above-stated past psychiatric history who is seen in follow-up.  He stated he continues to hear the voices, and that they tell him to kill himself.  He stated "they are always there".  Of note, he stated that these voices are "in my head" and not located externally.  He has apparently been resisting discharge because of his homelessness, and does not want to return to stay with his mother.  He also stated this morning that he is unable to work because "I just keep hearing them".  His vital signs are stable, he is afebrile.  He only slept 4 hours last night.  Review of his laboratories were all essentially normal except for his blood alcohol at 100 on 8/25.  His drug screen was completely negative.  He continues on fluoxetine, haloperidol, prazosin, temazepam and trazodone.  Principal Problem: <principal problem not specified> Diagnosis: Active Problems:   Schizophrenia ([place])  Total Time spent with patient: 20 minutes  Past Psychiatric History: See admission H&P  Past Medical History:  Past Medical History:  Diagnosis Date  . Schizophrenia [facility])     Past Surgical History:  Procedure Laterality Date  . KNEE SURGERY     Family History: History reviewed. No pertinent family history. Family Psychiatric  History: See admission H&P Social History:  Social History   Substance and Sexual Activity  Alcohol Use Yes   Comment: 1-2 beers every 2 days     Social History   Substance and Sexual Activity  Drug Use Yes  . Types: Marijuana   Comment: occasional THC use    Social  History   Socioeconomic History  . Marital status: Single    Spouse name: Not on file  . Number of children: Not on file  . Years of education: Not on file  . Highest education level: Not on file  Occupational History  . Not on file  Social Needs  . Financial resource strain: Not on file  . Food insecurity    Worry: Not on file    Inability: Not on file  . Transportation needs    Medical: Not on file    Non-medical: Not on file  Tobacco Use  . Smoking status: Current Every Day Smoker    Packs/day: 0.50  . Smokeless tobacco: Current User    Types: Chew  Substance and Sexual Activity  . Alcohol use: Yes    Comment: 1-2 beers every 2 days  . Drug use: Yes    Types: Marijuana    Comment: occasional THC use  . Sexual activity: Yes    Birth control/protection: Condom  Lifestyle  . Physical activity    Days per week: Not on file    Minutes per session: Not on file  . Stress: Not on file  Relationships  . Social Herbalist on phone: Not on file    Gets together: Not on file    Attends religious service: Not on file    Active member of club or organization: Not on file    Attends meetings of clubs or organizations: Not on file    Relationship status:  Not on file  Other Topics Concern  . Not on file  Social History Narrative  . Not on file   Additional Social History:                         Sleep: Fair  Appetite:  Good  Current Medications: Current Facility-Administered Medications  Medication Dose Route Frequency Provider Last Rate Last Dose  . benztropine (COGENTIN) tablet 1 mg  1 mg Oral BID Rankin, Shuvon B, NP   1 mg at 10/05/18 0739  . feeding supplement (ENSURE ENLIVE) (ENSURE ENLIVE) liquid 237 mL  237 mL Oral BID BM Malvin JohnsFarah, Brian, MD   237 mL at 10/05/18 0947  . FLUoxetine (PROZAC) capsule 20 mg  20 mg Oral QHS Rankin, Shuvon B, NP   20 mg at 10/04/18 2118  . haloperidol (HALDOL) tablet 7 mg  7 mg Oral TID Malvin JohnsFarah, Brian, MD   7 mg at  10/05/18 0739  . nicotine (NICODERM CQ - dosed in mg/24 hours) patch 21 mg  21 mg Transdermal Daily Malvin JohnsFarah, Brian, MD   21 mg at 10/05/18 0830  . prazosin (MINIPRESS) capsule 5 mg  5 mg Oral QHS Rankin, Shuvon B, NP   5 mg at 10/04/18 2118  . temazepam (RESTORIL) capsule 30 mg  30 mg Oral QHS Malvin JohnsFarah, Brian, MD   30 mg at 10/04/18 2118  . traZODone (DESYREL) tablet 100 mg  100 mg Oral QHS PRN Jearld Leschixon, Rashaun M, NP   100 mg at 10/04/18 2118    Lab Results: No results found for this or any previous visit (from the past 48 hour(s)).  Blood Alcohol level:  Lab Results  Component Value Date   ETH 100 (H) 10/01/2018   ETH <10 08/13/2018    Metabolic Disorder Labs: Lab Results  Component Value Date   HGBA1C 5.4 08/13/2018   MPG 108.28 08/13/2018   MPG 111.15 10/11/2017   Lab Results  Component Value Date   PROLACTIN 22.1 (H) 10/11/2017   Lab Results  Component Value Date   CHOL 235 (H) 08/13/2018   TRIG 210 (H) 08/13/2018   HDL 41 08/13/2018   CHOLHDL 5.7 08/13/2018   VLDL 42 (H) 08/13/2018   LDLCALC 152 (H) 08/13/2018   LDLCALC UNABLE TO CALCULATE IF TRIGLYCERIDE OVER 400 mg/dL 09/81/191409/06/2017    Physical Findings: AIMS: Facial and Oral Movements Muscles of Facial Expression: None, normal Lips and Perioral Area: None, normal Jaw: None, normal Tongue: None, normal,Extremity Movements Upper (arms, wrists, hands, fingers): None, normal Lower (legs, knees, ankles, toes): None, normal, Trunk Movements Neck, shoulders, hips: None, normal, Overall Severity Severity of abnormal movements (highest score from questions above): None, normal Incapacitation due to abnormal movements: None, normal Patient's awareness of abnormal movements (rate only patient's report): No Awareness, Dental Status Current problems with teeth and/or dentures?: No Does patient usually wear dentures?: No  CIWA:    COWS:     Musculoskeletal: Strength & Muscle Tone: within normal limits Gait & Station:  normal Patient leans: N/A  Psychiatric Specialty Exam: Physical Exam  Nursing note and vitals reviewed. Constitutional: He is oriented to person, place, and time. He appears well-developed and well-nourished.  HENT:  Head: Normocephalic and atraumatic.  Respiratory: Effort normal.  Neurological: He is alert and oriented to person, place, and time.    ROS  Blood pressure (!) 103/56, pulse 79, temperature 97.8 F (36.6 C), temperature source Oral, resp. rate 18, height 6\' 2"  (1.88 m), weight  75.8 kg, SpO2 100 %.Body mass index is 21.44 kg/m.  General Appearance: Fairly Groomed  Eye Contact:  Good  Speech:  Normal Rate  Volume:  Normal  Mood:  Euthymic  Affect:  Non-Congruent  Thought Process:  Coherent and Descriptions of Associations: Intact  Orientation:  Full (Time, Place, and Person)  Thought Content:  Hallucinations: Auditory  Suicidal Thoughts:  Yes.  without intent/plan  Homicidal Thoughts:  No  Memory:  Immediate;   Fair Recent;   Fair Remote;   Fair  Judgement:  Impaired  Insight:  Lacking  Psychomotor Activity:  Normal  Concentration:  Concentration: Good and Attention Span: Good  Recall:  Good  Fund of Knowledge:  Good  Language:  Good  Akathisia:  Negative  Handed:  Right  AIMS (if indicated):     Assets:  Desire for Improvement Resilience  ADL's:  Intact  Cognition:  WNL  Sleep:  Number of Hours: 4     Treatment Plan Summary: Daily contact with patient to assess and evaluate symptoms and progress in treatment, Medication management and Plan : Patient is seen and examined.  Patient is a 58 year old male with the above-stated past psychiatric history who is seen in follow-up.   Diagnosis: #1 suicidal ideation, #2 auditory hallucinations, #3 non-congruent psychosis, #4 reported history of depression, alcohol dependence  Patient is seen in follow-up.  He actually was able to smile and engage after he found out that he was not being discharged today.  He  continues to voice concern over auditory hallucinations that are telling him to kill himself that are located "in my head".  These are [facility] externally located.  He is very pleasant and otherwise his psychiatric examination is completely normal except for the above-stated issue.  His sleep is not great, and is currently on temazepam as well as trazodone.  He also receives 5 mg of prazosin.  Because of his sleep issues I will add Remeron to see if that would be beneficial so we can get away from any medication that might lead to self-harm. 1.  Continue Cogentin 1 mg p.o. twice daily for side effects of medication. 2.  Continue fluoxetine 20 mg p.o. nightly for mood and anxiety. 3.  Continue haloperidol 7 mg p.o. 3 times daily for psychosis. 4.  Continue prazosin 5 mg p.o. nightly for nightmares, flashbacks and prostate issues. 5.  Continue temazepam 30 mg p.o. nightly for insomnia. 6.  Continue trazodone 100 mg p.o. nightly as needed insomnia. 7.  Start Remeron 15 mg p.o. nightly for mood and insomnia. 8.  Disposition planning-in progress.  Antonieta Pert, MD 10/05/2018, 10:55 AM

## 2018-10-06 MED ORDER — MIRTAZAPINE 7.5 MG PO TABS
7.5000 mg | ORAL_TABLET | Freq: Every day | ORAL | Status: DC
  Administered 2018-10-06: 7.5 mg via ORAL
  Filled 2018-10-06 (×4): qty 1

## 2018-10-06 MED ORDER — TRAZODONE HCL 150 MG PO TABS
150.0000 mg | ORAL_TABLET | Freq: Every evening | ORAL | Status: DC | PRN
  Administered 2018-10-06: 150 mg via ORAL
  Filled 2018-10-06: qty 1

## 2018-10-06 NOTE — [facility] Group Notes (Signed)
[facility] LCSW Group Therapy Note  Date/Time:  10/06/2018 9:00-10:00 or 10:00-11:00AM  Type of Therapy and Topic:  Group Therapy:  Healthy and Unhealthy Supports  Participation Level:  Did Not Attend   Description of Group:  Patients in this group were introduced to the idea of adding a variety of healthy supports to address the various needs in their lives.Patients discussed what additional healthy supports could be helpful in their recovery and wellness after discharge in order to prevent future hospitalizations.   An emphasis was placed on using counselor, doctor, therapy groups, 12-step groups, and problem-specific support groups to expand supports.  They also worked as a group on developing a specific plan for several patients to deal with unhealthy supports through boundary-setting, psychoeducation with loved ones, and even termination of relationships.   Therapeutic Goals:   1)  discuss importance of adding supports to stay well once out of the hospital  2)  compare healthy versus unhealthy supports and identify some examples of each  3)  generate ideas and descriptions of healthy supports that can be added  4)  offer mutual support about how to address unhealthy supports  5)  encourage active participation in and adherence to discharge plan    Summary of Patient Progress:  The patient did not attend Therapeutic Modalities:   Motivational Interviewing Brief Solution-Focused Therapy  Daphine Loch D Nicolena Schurman         

## 2018-10-06 NOTE — Progress Notes (Signed)
Patient observed in bed resting at beginning of shift. He came to the dayroom for his vitals to be taken and had snack. Writer spoke with him and asked if he remembered coming out of his room last night running towards the end of the hall and he didn't recall. He had gotten up several times last night walking around in his room. Writer informed him of his increase in sleep aid for tonight. Writer re educated him on fall safety when getting up during the night. He was compliant with his medications, ate a snack and watched tv briefly before retuning to his room to rest. Safety maintained with 15 min checks.

## 2018-10-06 NOTE — Progress Notes (Signed)
Adult Psychoeducational Group Note  Date:  10/06/2018 Time:  10:37 PM  Group Topic/Focus:  Wrap-Up Group:   The focus of this group is to help patients review their daily goal of treatment and discuss progress on daily workbooks.  Participation Level:  Did Not Attend  Participation Quality:  Did not attend  Affect:  Did not attend  Cognitive:  Did not attend  Insight: None  Engagement in Group:  Did not attend  Modes of Intervention:  Did not attend  Additional Comments:  Pt did not attend evening wrap up group tonight.  Candy Sledge 10/06/2018, 10:37 PM

## 2018-10-06 NOTE — Progress Notes (Signed)
[facility] MD Progress Note  10/06/2018 9:43 AM Edwin HackerBilly Henry  MRN:  [id number] Subjective:  Patient is a 58 year old male with a history of auditory hallucinations, major depression, alcohol use disorder and possible schizoaffective disorder.  He was admitted on 08/12/2018-08/21/2018, and readmitted on 10/03/2022 essentially the same complaints.   Objective: Patient is seen and examined.  Patient is a 58 year old male with the above-stated past psychiatric history who is seen in follow-up.  We discussed his probable discharge tomorrow.  He stated he is not very happy about that.  He stated he likes coming here because he gets good friends, the nurses care about him, and everything works well here.  When we discussed the fact that he is going out tomorrow he came up with several side effects to medications including a tremor.  I examined for tremor, and its very mild if at all.  His auditory hallucinations are stable, and I do not think will really change.  He stated he did not sleep well, and nursing notes reflect that he slept 4.25 hours.  He requested a shot for sleep, and I stated would probably want to just do an oral medicine instead.  He continues on fluoxetine 20 mg p.o. daily, Haldol 7 mg p.o. 3 times daily, mirtazapine 15 mg p.o. nightly, prazosin 5 mg p.o. nightly, temazepam 30 mg p.o. nightly and trazodone 100 mg p.o. nightly as needed.  Principal Problem: <principal problem not specified> Diagnosis: Active Problems:   Schizophrenia (HCC)  Total Time spent with patient: 20 minutes  Past Psychiatric History: See admission H&P  Past Medical History:  Past Medical History:  Diagnosis Date  . Schizophrenia [facility](HCC)     Past Surgical History:  Procedure Laterality Date  . KNEE SURGERY     Family History: History reviewed. No pertinent family history. Family Psychiatric  History: See admission H&P Social History:  Social History   Substance and Sexual Activity  Alcohol Use Yes   Comment: 1-2  beers every 2 days     Social History   Substance and Sexual Activity  Drug Use Yes  . Types: Marijuana   Comment: occasional THC use    Social History   Socioeconomic History  . Marital status: Single    Spouse name: Not on file  . Number of children: Not on file  . Years of education: Not on file  . Highest education level: Not on file  Occupational History  . Not on file  Social Needs  . Financial resource strain: Not on file  . Food insecurity    Worry: Not on file    Inability: Not on file  . Transportation needs    Medical: Not on file    Non-medical: Not on file  Tobacco Use  . Smoking status: Current Every Day Smoker    Packs/day: 0.50  . Smokeless tobacco: Current User    Types: Chew  Substance and Sexual Activity  . Alcohol use: Yes    Comment: 1-2 beers every 2 days  . Drug use: Yes    Types: Marijuana    Comment: occasional THC use  . Sexual activity: Yes    Birth control/protection: Condom  Lifestyle  . Physical activity    Days per week: Not on file    Minutes per session: Not on file  . Stress: Not on file  Relationships  . Social Musicianconnections    Talks on phone: Not on file    Gets together: Not on file    Attends  religious service: Not on file    Active member of club or organization: Not on file    Attends meetings of clubs or organizations: Not on file    Relationship status: Not on file  Other Topics Concern  . Not on file  Social History Narrative  . Not on file   Additional Social History:                         Sleep: Fair  Appetite:  Good  Current Medications: Current Facility-Administered Medications  Medication Dose Route Frequency Provider Last Rate Last Dose  . benztropine (COGENTIN) tablet 1 mg  1 mg Oral BID Rankin, Shuvon B, NP   1 mg at 10/06/18 0857  . feeding supplement (ENSURE ENLIVE) (ENSURE ENLIVE) liquid 237 mL  237 mL Oral BID BM Malvin Johns, MD   237 mL at 10/06/18 0906  . FLUoxetine (PROZAC)  capsule 20 mg  20 mg Oral QHS Rankin, Shuvon B, NP   20 mg at 10/05/18 2029  . haloperidol (HALDOL) tablet 7 mg  7 mg Oral TID Malvin Johns, MD   7 mg at 10/06/18 0857  . mirtazapine (REMERON) tablet 15 mg  15 mg Oral QHS Antonieta Pert, MD   15 mg at 10/05/18 2028  . nicotine (NICODERM CQ - dosed in mg/24 hours) patch 21 mg  21 mg Transdermal Daily Malvin Johns, MD   21 mg at 10/06/18 0859  . prazosin (MINIPRESS) capsule 5 mg  5 mg Oral QHS Rankin, Shuvon B, NP   5 mg at 10/05/18 2029  . temazepam (RESTORIL) capsule 30 mg  30 mg Oral QHS Malvin Johns, MD   30 mg at 10/05/18 2029  . traZODone (DESYREL) tablet 100 mg  100 mg Oral QHS PRN Jearld Lesch, NP   100 mg at 10/04/18 2118    Lab Results: No results found for this or any previous visit (from the past 48 hour(s)).  Blood Alcohol level:  Lab Results  Component Value Date   ETH 100 (H) 10/01/2018   ETH <10 08/13/2018    Metabolic Disorder Labs: Lab Results  Component Value Date   HGBA1C 5.4 08/13/2018   MPG 108.28 08/13/2018   MPG 111.15 10/11/2017   Lab Results  Component Value Date   PROLACTIN 22.1 (H) 10/11/2017   Lab Results  Component Value Date   CHOL 235 (H) 08/13/2018   TRIG 210 (H) 08/13/2018   HDL 41 08/13/2018   CHOLHDL 5.7 08/13/2018   VLDL 42 (H) 08/13/2018   LDLCALC 152 (H) 08/13/2018   LDLCALC UNABLE TO CALCULATE IF TRIGLYCERIDE OVER 400 mg/dL 45/40/9811    Physical Findings: AIMS: Facial and Oral Movements Muscles of Facial Expression: None, normal Lips and Perioral Area: None, normal Jaw: None, normal Tongue: None, normal,Extremity Movements Upper (arms, wrists, hands, fingers): None, normal Lower (legs, knees, ankles, toes): None, normal, Trunk Movements Neck, shoulders, hips: None, normal, Overall Severity Severity of abnormal movements (highest score from questions above): None, normal Incapacitation due to abnormal movements: None, normal Patient's awareness of abnormal movements  (rate only patient's report): No Awareness, Dental Status Current problems with teeth and/or dentures?: No Does patient usually wear dentures?: No  CIWA:    COWS:     Musculoskeletal: Strength & Muscle Tone: within normal limits Gait & Station: normal Patient leans: N/A  Psychiatric Specialty Exam: Physical Exam  Nursing note and vitals reviewed. Constitutional: He is oriented to person, place, and  time. He appears well-developed and well-nourished.  HENT:  Head: Normocephalic and atraumatic.  Respiratory: Effort normal.  Neurological: He is alert and oriented to person, place, and time.    ROS  Blood pressure 100/60, pulse 87, temperature 97.6 F (36.4 C), temperature source Oral, resp. rate 18, height 6\' 2"  (1.88 m), weight 75.8 kg, SpO2 100 %.Body mass index is 21.44 kg/m.  General Appearance: Casual  Eye Contact:  Fair  Speech:  Normal Rate  Volume:  Normal  Mood:  Anxious  Affect:  Congruent  Thought Process:  Coherent and Descriptions of Associations: Circumstantial  Orientation:  Full (Time, Place, and Person)  Thought Content:  Hallucinations: Auditory  Suicidal Thoughts:  No  Homicidal Thoughts:  No  Memory:  Immediate;   Fair Recent;   Fair Remote;   Fair  Judgement:  Intact  Insight:  Lacking  Psychomotor Activity:  Normal  Concentration:  Concentration: Fair and Attention Span: Fair  Recall:  [employer] of Knowledge:  Fair  Language:  Fair  Akathisia:  Negative  Handed:  Right  AIMS (if indicated):     Assets:  Desire for Improvement Resilience  ADL's:  Intact  Cognition:  WNL  Sleep:  Number of Hours: 4.25     Treatment Plan Summary: Daily contact with patient to assess and evaluate symptoms and progress in treatment, Medication management and Plan : Patient is seen and examined.  Patient is a 58 year old male with the above-stated past psychiatric history who is seen in follow-up.   Diagnosis: #1 suicidal ideation, #2 auditory hallucinations,  #3 non-congruent psychosis, #4 reported history of depression, alcohol dependence  Patient is seen in follow-up.  He is essentially unchanged from yesterday.  He does appear to be trying to extend his hospitalization.  I am going to decrease his Remeron to 7.5 mg p.o. nightly to increase the sedation from this medication, and also increase his trazodone 250 mg p.o. nightly as needed.  No other changes in the rest of his medications at this point.  I anticipate discharge tomorrow. 1.  Continue Cogentin 1 mg p.o. twice daily for side effects of medication. 2.  Continue fluoxetine 20 mg p.o. nightly for mood and anxiety. 3.  Continue haloperidol 7 mg p.o. 3 times daily for psychosis. 4.  Continue prazosin 5 mg p.o. nightly for nightmares, flashbacks and prostate issues. 5.  Continue temazepam 30 mg p.o. nightly for insomnia. 6.    Increase trazodone to 150 mg p.o. nightly as needed insomnia. 7.    Decrease Remeron to 7.5 mg p.o. nightly for mood and insomnia. 8.  Disposition planning-in progress.  Sharma Covert, MD 10/06/2018, 9:43 AM

## 2018-10-06 NOTE — Plan of Care (Signed)
Nurse discussed anxiety,, depression and coping skills with patient.

## 2018-10-06 NOTE — Progress Notes (Signed)
D:  Patient denied HI.  Stated he did have suicidal thoughts yesterday to jump in front of car, but denied suicidal thoughts today.  Contracts for safety.  Denied HI.  Stated he does see shadows and hears voices to kill himself. A:  Medications administered per MD orders.  Emotional support and encouragement given patient. R:  Safety maintained with 15 minute checks. Patient has stayed in bed sleeping most of the day.

## 2018-10-07 MED ORDER — HALOPERIDOL 20 MG PO TABS: 20.0000 mg | ORAL_TABLET | Freq: Every day | ORAL | 2 refills | Status: DC

## 2018-10-07 MED ORDER — BENZTROPINE MESYLATE 1 MG PO TABS: 1.0000 mg | ORAL_TABLET | Freq: Two times a day (BID) | ORAL | 2 refills | Status: DC

## 2018-10-07 MED ORDER — MIRTAZAPINE 30 MG PO TABS: 30.0000 mg | ORAL_TABLET | Freq: Every day | ORAL | 2 refills | Status: DC

## 2018-10-07 NOTE — Progress Notes (Signed)
Recreation Therapy Notes  Date: 8.31.20 Time: 1000 Location: 500 Hall Dayroom  Group Topic: Triggers  Goal Area(s) Addresses:  Patient will identify biggest triggers. Patient will identify how to deal with triggers. Patient will identify how to avoid triggers.   Intervention:  Worksheet, pencils  Activity: Triggers.  Patients were to identify a problem they are currently facing because of triggers.  Patients also identified what their triggers are.  Patients then identified how they deal with triggers head on and how they avoid those triggers.   Education: Communication, Discharge Planning  Education Outcome: Acknowledges understanding/In group clarification offered/Needs additional education.   Clinical Observations/Feedback: Pt did not attend group.    Victorino Sparrow, LRT/CTRS         Victorino Sparrow A 10/07/2018 11:23 AM

## 2018-10-07 NOTE — Discharge Summary (Signed)
Physician Discharge Summary Note  Patient:  Edwin Henry is an 58 y.o., male MRN:  [id number] DOB:  28-Jan-1961 Patient phone:  [phone] (home)  Patient address:   [address] [place] [id number],  Total Time spent with patient: 45 minutes  Date of Admission:  10/02/2018 Date of Discharge: 10/07/2018 Reason for Admission:   Mr. Edwin Henry is well-known to the service he was recently here and has had numerous prior admissions and encounters.  He is 57, [facility]  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  At any rate he said he had voices telling him to kill himself and was admitted for stabilization.  He tells me the voices are exclusively inside and not outside his head so they are probably just intrusive thoughts if they are indeed there at all.  His drug screen is negative.  Last discharged on 7/15 at that point in time he told me he wanted to stay hospitalized for housing purposes however now he states he is living with his mother.  His suicidal thoughts are passive no thoughts of harming self or others while here as far specific plans or intent.  Denies current but reports recent voices inside his head.  Principal Problem: <principal problem not specified> Discharge Diagnoses: Active Problems:   Schizophrenia [facility](HCC)   Past Psychiatric History: see eval  Past Medical History:  Past Medical History:  Diagnosis Date  . Schizophrenia [facility](HCC)     Past Surgical History:  Procedure Laterality Date  . KNEE SURGERY     Family History: History reviewed. No pertinent family history. Family Psychiatric   History: see eval Social History:  Social History   Substance and Sexual Activity  Alcohol Use Yes   Comment: 1-2 beers every 2 days     Social History   Substance and Sexual Activity  Drug Use Yes  . Types: Marijuana   Comment: occasional THC use    Social History   Socioeconomic History  . Marital status: Single    Spouse name: Not on file  . Number of children: Not on file  . Years of education: Not on file  . Highest education level: Not on file  Occupational History  . Not on file  Social Needs  . Financial resource strain: Not on file  . Food insecurity    Worry: Not on file    Inability: Not on file  . Transportation needs    Medical: Not on file    Non-medical: Not on file  Tobacco Use  . Smoking status: Current Every Day Smoker    Packs/day: 0.50  . Smokeless tobacco: Current User    Types: Chew  Substance and Sexual Activity  . Alcohol use: Yes    Comment: 1-2 beers every 2 days  . Drug use: Yes    Types: Marijuana    Comment: occasional THC use  . Sexual activity: Yes    Birth control/protection: Condom  Lifestyle  . Physical activity    Days per week: Not on file    Minutes per session: Not on file  . Stress: Not on file  Relationships  . Social connections  Talks on phone: Not on file    Gets together: Not on file    Attends religious service: Not on file    Active member of club or organization: Not on file    Attends meetings of clubs or organizations: Not on file    Relationship status: Not on file  Other Topics Concern  . Not on file  Social History Narrative  . Not on file    Hospital Course:   This was a typical admission for Mr. Edwin Henry, he generally stayed to himself, lobbied for a prolonged hospital stay, each day stating he was not ready to go endorsing auditory hallucinations simply inside his head and vague thoughts of not wanting to be here but no suicidal plans while here.  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  Physical Findings: AIMS: Facial and Oral Movements Muscles of Facial Expression: None, normal Lips and Perioral Area: None, normal Jaw: None, normal Tongue: None, normal,Extremity Movements Upper (arms, wrists, hands, fingers): None, normal Lower (legs, knees, ankles, toes): None, normal, Trunk Movements Neck, shoulders, hips: None, normal, Overall Severity Severity of abnormal movements (highest score from questions above): None, normal Incapacitation due to abnormal movements: None, normal Patient's awareness of abnormal movements (rate only patient's report): No Awareness, Dental Status Current problems with teeth and/or dentures?: No Does patient usually wear dentures?: No  CIWA:  CIWA-Ar Total: 1 COWS:  COWS Total Score: 1  Musculoskeletal: Strength & Muscle Tone: within normal limits Gait & Station: normal Patient leans: N/A  Psychiatric Specialty Exam: Physical Exam  ROS  Blood pressure 118/60, pulse 87, temperature (!) 97.5 F (36.4 C), temperature source Oral, resp. rate 18, height 6\' 2"  (1.88 m), weight 75.8 kg, SpO2 100 %.Body mass index is 21.44 kg/m.  General Appearance: Casual  Eye Contact:  Good  Speech:  Clear and Coherent  Volume:  Normal  Mood:  Dysphoric  Affect:  Constricted  Thought Process:  Goal Directed  Orientation:  Full (Time, Place, and Person)  Thought Content:  Logical  Suicidal Thoughts:  No  Homicidal Thoughts:  No  Memory:  Immediate;   Fair Recent;   Fair  Judgement:  Fair  Insight:  Fair  Psychomotor Activity:  Normal  Concentration:  Concentration: Fair and Attention Span: Fair  Recall:  [employer] of Knowledge:  Fair  Language:  Fair  Akathisia:  Negative  Handed:  Right  AIMS (if indicated):     Assets:   Communication Skills Desire for Improvement  ADL's:  Intact  Cognition:  WNL  Sleep:  Number of Hours: 4.75        Has this patient used any form of tobacco in the last 30 days? (Cigarettes, Smokeless Tobacco, Cigars, and/or Pipes) Yes, No  Blood Alcohol level:  Lab Results  Component Value Date   ETH 100 (H) 10/01/2018   ETH <10 67/89/3810    Metabolic Disorder Labs:  Lab Results  Component Value Date   HGBA1C 5.4 08/13/2018   MPG 108.28 08/13/2018   MPG 111.15 10/11/2017   Lab Results  Component Value Date   PROLACTIN 22.1 (H) 10/11/2017   Lab Results  Component Value Date   CHOL 235 (H) 08/13/2018   TRIG 210 (H) 08/13/2018   HDL  41 08/13/2018   CHOLHDL 5.7 08/13/2018   VLDL 42 (H) 08/13/2018   LDLCALC 152 (H) 08/13/2018   LDLCALC UNABLE TO CALCULATE IF TRIGLYCERIDE OVER 400 mg/dL 39/76/7341    See Psychiatric Specialty Exam and Suicide Risk Assessment completed by Attending Physician prior to discharge.  Discharge destination:  Home  Is patient on multiple antipsychotic therapies at discharge:  No   Has Patient had three or more failed trials of antipsychotic monotherapy by history:  No  Recommended Plan for Multiple Antipsychotic Therapies: NA   Allergies as of 10/07/2018   No Known Allergies     Medication List    TAKE these medications     Indication  benztropine 1 MG tablet Commonly known as: COGENTIN Take 1 tablet (1 mg total) by mouth 2 (two) times daily.  Indication: Extrapyramidal Reaction caused by Medications   FLUoxetine 20 MG capsule Commonly known as: PROZAC Take 20 mg by mouth at bedtime.  Indication: Depression   haloperidol 20 MG tablet Commonly known as: HALDOL Take 1 tablet (20 mg total) by mouth at bedtime.  Indication: Psychosis   mirtazapine 30 MG tablet Commonly known as: REMERON Take 1 tablet (30 mg total) by mouth at bedtime.  Indication: Major Depressive Disorder   prazosin 5 MG capsule Commonly known as:  MINIPRESS Take 5 mg by mouth at bedtime.  Indication: Frightening Dreams   thiamine 250 MG tablet Take 1 tablet (250 mg total) by mouth daily.  Indication: Deficiency of Vitamin B1      Follow-up Information    [facility] Follow up.   Why: Your ACTT services will continue after discharge. The ACTT administrator Leotis Shames will contact you when you discharge for follow up.  Contact information: [address] [place] [address] Ph: [phone] Fx: [phone]         Signed: Malvin Johns, MD 10/07/2018, 9:45 AM

## 2018-10-07 NOTE — Progress Notes (Signed)
Patient ID: Edwin Henry, male   DOB: 05-Sep-1960, 58 y.o.   MRN: [id number]   CSW scheduled [place] for patient for 11:30am. Pt's nurse was notified.

## 2018-10-07 NOTE — Progress Notes (Signed)
Discharge note  Patient verbalizes readiness for discharge. Follow up plan explained, AVS, Transition record and SRA given. Prescriptions and teaching provided. Belongings returned and signed for. Suicide safety plan completed and signed. Patient verbalizes understanding. Patient denies SI/HI and assures this Probation officer he will seek assistance should that change. Patient discharged to lobby where pt was instructed to wait for their Lyft.

## 2018-10-07 NOTE — Progress Notes (Signed)
Recreation Therapy Notes  INPATIENT RECREATION TR PLAN  Patient Details Name: Edwin Henry MRN: [id number] DOB: Apr 10, 1960 Today's Date: 10/07/2018  Rec Therapy Plan Is patient appropriate for Therapeutic Recreation?: Yes Treatment times per week: about 3 days Estimated Length of Stay: 5-7 days TR Treatment/Interventions: Group participation (Comment)  Discharge Criteria Pt will be discharged from therapy if:: Discharged Treatment plan/goals/alternatives discussed and agreed upon by:: Patient/family  Discharge Summary Short term goals set: See patient care plan Short term goals met: Not met Progress toward goals comments: Groups attended Which groups?: Other (Comment)(Team building) Reason goals not met: Pt attended one group session Therapeutic equipment acquired: N/A Reason patient discharged from therapy: Discharge from hospital Pt/family agrees with progress & goals achieved: Yes Date patient discharged from therapy: 10/07/18    Victorino Sparrow, LRT/CTRS  Ria Comment, [place] 10/07/2018, 11:32 AM

## 2018-10-07 NOTE — Progress Notes (Signed)
  [facility] Adult Case Management Discharge Plan :  Will you be returning to the same living situation after discharge:  Yes,  pt lives in his car At discharge, do you have transportation home?: Yes,  [place] lyft ride Do you have the ability to pay for your medications: Yes,  medicaid  Release of information consent forms completed and in the chart;  Patient's signature needed at discharge.  Patient to Follow up at: Follow-up Information    [facility] Follow up.   Why: Your ACTT services will continue after discharge on Tuesday 9/1 at 10:30a.. The ACTT administrator Lauren will contact you when you discharge for follow up.  Contact information: [place] [address] Ph: [phone] Fx: [phone]          Next level of care provider has access to [place] and Suicide Prevention discussed: Yes,  with pt     Has patient been referred to the Quitline?: N/A patient is not a smoker  Patient has been referred for addiction treatment: Yes  Trecia Rogers, LCSW 10/07/2018, 10:57 AM

## 2018-10-07 NOTE — Progress Notes (Signed)
D:  Patient's self inventory sheet, patient has fair sleep, sleep medication not helpful.  Fair appetite, low energy level, good concentration.   Has some depression and anxiety.  Withdrawals, Tremors, irritability.  Denied SI.  Physical problems, lightheaded, headaches.  Denied physical pain.  Pain mediicne helpful.  Goal is continue taking pills hoping to get right. A:  Medications administered per MD orders.  Emotional support and encouragement given patient. R:  Denied SI on self inventory sheet, contracts for safety.  Denied A/V hallucinations.  Safety maintained with 15 minute checks. Paiient looking forward to discharge today.

## 2018-10-07 NOTE — Plan of Care (Signed)
Pt attended one recreation therapy group session.  Pt is also being discharged.    Victorino Sparrow, LRT/CTRS

## 2018-10-07 NOTE — Tx Team (Signed)
Interdisciplinary Treatment and Diagnostic Plan Update  10/07/2018 Time of Session: 10:20am Edwin Henry MRN: [id number]  Principal Diagnosis: <principal problem not specified>  Secondary Diagnoses: Active Problems:   Schizophrenia ([place])   Current Medications:  Current Facility-Administered Medications  Medication Dose Route Frequency Provider Last Rate Last Dose  . benztropine (COGENTIN) tablet 1 mg  1 mg Oral BID Rankin, Shuvon B, NP   1 mg at 10/07/18 0817  . feeding supplement (ENSURE ENLIVE) (ENSURE ENLIVE) liquid 237 mL  237 mL Oral BID BM Johnn Hai, MD   237 mL at 10/07/18 0817  . FLUoxetine (PROZAC) capsule 20 mg  20 mg Oral QHS Rankin, Shuvon B, NP   20 mg at 10/06/18 2045  . haloperidol (HALDOL) tablet 7 mg  7 mg Oral TID Johnn Hai, MD   7 mg at 10/07/18 0816  . mirtazapine (REMERON) tablet 7.5 mg  7.5 mg Oral QHS Sharma Covert, MD   7.5 mg at 10/06/18 2045  . nicotine (NICODERM CQ - dosed in mg/24 hours) patch 21 mg  21 mg Transdermal Daily Johnn Hai, MD   21 mg at 10/07/18 0817  . prazosin (MINIPRESS) capsule 5 mg  5 mg Oral QHS Rankin, Shuvon B, NP   5 mg at 10/06/18 2045  . temazepam (RESTORIL) capsule 30 mg  30 mg Oral QHS Johnn Hai, MD   30 mg at 10/06/18 2045  . traZODone (DESYREL) tablet 150 mg  150 mg Oral QHS PRN Sharma Covert, MD   150 mg at 10/06/18 2045   PTA Medications: Medications Prior to Admission  Medication Sig Dispense Refill Last Dose  . FLUoxetine (PROZAC) 20 MG capsule Take 20 mg by mouth at bedtime.     . prazosin (MINIPRESS) 5 MG capsule Take 5 mg by mouth at bedtime.     . thiamine 250 MG tablet Take 1 tablet (250 mg total) by mouth daily. 90 tablet 1   . [DISCONTINUED] benztropine (COGENTIN) 1 MG tablet Take 1 tablet (1 mg total) by mouth 2 (two) times daily. 60 tablet 2   . [DISCONTINUED] haloperidol (HALDOL) 20 MG tablet Take 1 tablet (20 mg total) by mouth at bedtime. 30 tablet 2   . [DISCONTINUED] mirtazapine (REMERON)  30 MG tablet Take 1 tablet (30 mg total) by mouth at bedtime. 30 tablet 2     Patient Stressors: Financial difficulties Health problems Medication change or noncompliance Substance abuse  Patient Strengths: Active sense of humor General fund of knowledge Motivation for treatment/growth Physical Health Supportive family/friends  Treatment Modalities: Medication Management, Group therapy, Case management,  1 to 1 session with clinician, Psychoeducation, Recreational therapy.   Physician Treatment Plan for Primary Diagnosis: <principal problem not specified> Long Term Goal(s): Improvement in symptoms so as ready for discharge Improvement in symptoms so as ready for discharge   Short Term Goals: Ability to verbalize feelings will improve Ability to disclose and discuss suicidal ideas Ability to demonstrate self-control will improve Ability to identify and develop effective coping behaviors will improve Ability to identify and develop effective coping behaviors will improve Ability to maintain clinical measurements within normal limits will improve Compliance with prescribed medications will improve Ability to identify triggers associated with substance abuse/mental health issues will improve  Medication Management: Evaluate patient's response, side effects, and tolerance of medication regimen.  Therapeutic Interventions: 1 to 1 sessions, Unit Group sessions and Medication administration.  Evaluation of Outcomes: Adequate for Discharge  Physician Treatment Plan for Secondary Diagnosis: Active Problems:  Schizophrenia (HCC)  Long Term Goal(s): Improvement in symptoms so as ready for discharge Improvement in symptoms so as ready for discharge   Short Term Goals: Ability to verbalize feelings will improve Ability to disclose and discuss suicidal ideas Ability to demonstrate self-control will improve Ability to identify and develop effective coping behaviors will  improve Ability to identify and develop effective coping behaviors will improve Ability to maintain clinical measurements within normal limits will improve Compliance with prescribed medications will improve Ability to identify triggers associated with substance abuse/mental health issues will improve     Medication Management: Evaluate patient's response, side effects, and tolerance of medication regimen.  Therapeutic Interventions: 1 to 1 sessions, Unit Group sessions and Medication administration.  Evaluation of Outcomes: Adequate for Discharge   RN Treatment Plan for Primary Diagnosis: <principal problem not specified> Long Term Goal(s): Knowledge of disease and therapeutic regimen to maintain health will improve  Short Term Goals: Ability to participate in decision making will improve, Ability to verbalize feelings will improve, Ability to disclose and discuss suicidal ideas, Ability to identify and develop effective coping behaviors will improve and Compliance with prescribed medications will improve  Medication Management: RN will administer medications as ordered by provider, will assess and evaluate patient's response and provide education to patient for prescribed medication. RN will report any adverse and/or side effects to prescribing provider.  Therapeutic Interventions: 1 on 1 counseling sessions, Psychoeducation, Medication administration, Evaluate responses to treatment, Monitor vital signs and CBGs as ordered, Perform/monitor CIWA, COWS, AIMS and Fall Risk screenings as ordered, Perform wound care treatments as ordered.  Evaluation of Outcomes: Adequate for Discharge   LCSW Treatment Plan for Primary Diagnosis: <principal problem not specified> Long Term Goal(s): Safe transition to appropriate next level of care at discharge, Engage patient in therapeutic group addressing interpersonal concerns.  Short Term Goals: Engage patient in aftercare planning with referrals and  resources and Increase skills for wellness and recovery  Therapeutic Interventions: Assess for all discharge needs, 1 to 1 time with Social worker, Explore available resources and support systems, Assess for adequacy in community support network, Educate family and significant other(s) on suicide prevention, Complete Psychosocial Assessment, Interpersonal group therapy.  Evaluation of Outcomes: Adequate for Discharge   Progress in Treatment: Attending groups: No. Participating in groups: No. Taking medication as prescribed: Yes. Toleration medication: Yes. Family/Significant other contact made: Yes, individual(s) contacted:  with pt Patient understands diagnosis: Yes. Discussing patient identified problems/goals with staff: Yes. Medical problems stabilized or resolved: Yes. Denies suicidal/homicidal ideation: Yes. Issues/concerns per patient self-inventory: No. Other:   New problem(s) identified: No, Describe:  None  New Short Term/Long Term Goal(s): Medication stabilization, elimination of SI thoughts, and development of a comprehensive mental wellness plan.   Patient Goals:  "Get rid of the voices"  Discharge Plan or Barriers: Patient will be discharged back to living in his car with his ACTT team.   Reason for Continuation of Hospitalization: Patient is discharging today.   Estimated Length of Stay: Patient is discharging today.   Attendees: Patient:  10/07/2018   Physician: Dr. Malvin JohnsBrian Farah, MD 10/07/2018   Nursing: Meriam SpragueBeverly, RN 10/07/2018   RN Care Manager: 10/07/2018   Social Worker: Stephannie PetersJasmine Ahmad Vanwey, LCSW  10/07/2018  Recreational Therapist:  10/07/2018   Other:  10/07/2018   Other:  10/07/2018   Other: 10/07/2018     Scribe for Treatment Team: Delphia GratesJasmine M Yenni Carra, LCSW 10/07/2018 11:21 AM

## 2018-10-07 NOTE — [facility] Suicide Risk Assessment (Signed)
[facility] Discharge Suicide Risk Assessment   Principal Problem: <principal problem not specified> Discharge Diagnoses: Active Problems:   Schizophrenia ([place])   Total Time spent with patient: 45 minutes Musculoskeletal: Strength & Muscle Tone: within normal limits Gait & Station: normal Patient leans: N/A  Psychiatric Specialty Exam: Physical Exam  ROS  Blood pressure 118/60, pulse 87, temperature (!) 97.5 F (36.4 C), temperature source Oral, resp. rate 18, height 6\' 2"  (1.88 m), weight 75.8 kg, SpO2 100 %.Body mass index is 21.44 kg/m.  General Appearance: Casual  Eye Contact:  Good  Speech:  Clear and Coherent  Volume:  Normal  Mood:  Dysphoric  Affect:  Constricted  Thought Process:  Goal Directed  Orientation:  Full (Time, Place, and Person)  Thought Content:  Logical  Suicidal Thoughts:  No  Homicidal Thoughts:  No  Memory:  Immediate;   Fair Recent;   Fair  Judgement:  Fair  Insight:  Fair  Psychomotor Activity:  Normal  Concentration:  Concentration: Fair and Attention Span: Fair  Recall:  [employer] of Knowledge:  Fair  Language:  Fair  Akathisia:  Negative  Handed:  Right  AIMS (if indicated):     Assets:  Communication Skills Desire for Improvement  ADL's:  Intact  Cognition:  WNL  Sleep:  Number of Hours: 4.75   Mental Status Per Nursing Assessment::   On Admission:  Suicidal ideation indicated by patient, Intention to act on suicide plan, Suicide plan, Belief that plan would result in death, Self-harm thoughts  Demographic Factors:  Male  Loss Factors: Financial problems/change in socioeconomic status  Historical Factors: NA  Risk Reduction Factors:   Sense of responsibility to family and Religious beliefs about death  Continued Clinical Symptoms:  Previous Psychiatric Diagnoses and Treatments  Cognitive Features That Contribute To Risk:  Closed-mindedness    Suicide Risk:  Minimal: No identifiable suicidal ideation.  Patients presenting  with no risk factors but with morbid ruminations; may be classified as minimal risk based on the severity of the depressive symptoms  Follow-up Information    [facility] Follow up.   Why: Your ACTT services will continue after discharge. The ACTT administrator Ander Purpura will contact you when you discharge for follow up.  Contact information: [address] [place] [address] Ph: [phone] Fx: [phone]          Plan Of Care/Follow-up recommendations:  Activity:  full  Landry Kamath, MD 10/07/2018, 9:48 AM

## 2020-12-13 ENCOUNTER — Encounter: Payer: Self-pay | Admitting: *Deleted

## 2020-12-15 ENCOUNTER — Other Ambulatory Visit: Payer: Self-pay

## 2020-12-15 ENCOUNTER — Encounter: Payer: Self-pay | Admitting: Diagnostic Neuroimaging

## 2020-12-15 ENCOUNTER — Ambulatory Visit: Payer: Medicaid Other | Admitting: Diagnostic Neuroimaging

## 2020-12-15 VITALS — BP 141/86 | HR 78 | Ht 74.0 in | Wt 194.6 lb

## 2020-12-15 DIAGNOSIS — F251 Schizoaffective disorder, depressive type: Secondary | ICD-10-CM | POA: Diagnosis not present

## 2020-12-15 DIAGNOSIS — G2119 Other drug induced secondary parkinsonism: Secondary | ICD-10-CM

## 2020-12-15 NOTE — Patient Instructions (Signed)
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

## 2020-12-15 NOTE — Progress Notes (Signed)
[facility]  PATIENT: Edwin Henry DOB: [birth year]  REFERRING CLINICIAN: Cvejin, Snezana Zivojin* HISTORY FROM: patient  REASON FOR VISIT: new consult    HISTORICAL  CHIEF COMPLAINT:  Chief Complaint  Patient presents with   Parkinsonian or TD symptoms    Rm 7 New Pt , Ariana, ACT Therapist with [facility]    HISTORY OF PRESENT ILLNESS:   60 year old male with abnormal involuntary movements.  Patient has been on Haldol in the past.  At some point he started to have involuntary movements of his mouth, jaw and hands.  He was changed to Seroquel and then Abilify.  He tried [place] in April 2022 without relief.  He tried benztropine August 2022 without relief.    REVIEW OF SYSTEMS: Full 14 system review of systems performed and negative with exception of: as per HPI.   ALLERGIES: No Known Allergies  HOME MEDICATIONS: Outpatient Medications Prior to Visit  Medication Sig Dispense Refill   benztropine (COGENTIN) 1 MG tablet Take 1 tablet (1 mg total) by mouth 2 (two) times daily. 60 tablet 2   FLUoxetine (PROZAC) 20 MG capsule Take 20 mg by mouth at bedtime.     Melatonin 10 MG TABS Take 10 mg by mouth at bedtime.     methocarbamol (ROBAXIN) 750 MG tablet Take 750 mg by mouth 3 (three) times daily.     mirtazapine (REMERON) 15 MG tablet Take 15 mg by mouth at bedtime.     prazosin (MINIPRESS) 5 MG capsule Take 5 mg by mouth at bedtime.     QUEtiapine (SEROQUEL) 100 MG tablet Take 100 mg by mouth at bedtime.     valbenazine (INGREZZA) 80 MG capsule Take 80 mg by mouth daily.     haloperidol (HALDOL) 20 MG tablet Take 1 tablet (20 mg total) by mouth at bedtime. (Patient not taking: Reported on 12/15/2020) 30 tablet 2   thiamine 250 MG tablet Take 1 tablet (250 mg total) by mouth daily. (Patient not taking: Reported on 12/15/2020) 90 tablet 1   mirtazapine (REMERON) 30 MG tablet Take 1 tablet (30 mg total) by mouth at bedtime. 30 tablet 2   No  facility-administered medications prior to visit.    PAST MEDICAL HISTORY: Past Medical History:  Diagnosis Date   Insomnia disorder    Schizophrenia (HCC)     PAST SURGICAL HISTORY: Past Surgical History:  Procedure Laterality Date   KNEE SURGERY Right     FAMILY HISTORY: No family history on file.  SOCIAL HISTORY: Social History   Socioeconomic History   Marital status: Single    Spouse name: Not on file   Number of children: 1   Years of education: Not on file   Highest education level: 9th grade  Occupational History   Not on file  Tobacco Use   Smoking status: Former    Packs/day: 0.50    Types: Cigarettes    Quit date: 11/07/2019    Years since quitting: 1.1   Smokeless tobacco: Current    Types: Chew  Vaping Use   Vaping Use: Never used  Substance and Sexual Activity   Alcohol use: Yes    Comment: 1-2 beers every 2 days   Drug use: Not Currently    Types: Marijuana    Comment: occasional THC use, 12/15/20 not now   Sexual activity: Yes    Birth control/protection: Condom  Other Topics Concern   Not on file  Social History Narrative   Completed 9th grade special ed  12/13/20 lives alone in apt   Caffeine- sodas 2 a day, occas energy drink    Social Determinants of Health   Financial Resource Strain: Not on file  Food Insecurity: Not on file  Transportation Needs: Not on file  Physical Activity: Not on file  Stress: Not on file  Social Connections: Not on file  Intimate Partner Violence: Not on file     PHYSICAL EXAM  GENERAL EXAM/CONSTITUTIONAL: Vitals:  Vitals:   12/15/20 0849  BP: (!) 141/86  Pulse: 78  Weight: 194 lb 9.6 oz (88.3 kg)  Height: 6\' 2"  (1.88 m)   Body mass index is 24.99 kg/m. Wt Readings from Last 3 Encounters:  12/15/20 194 lb 9.6 oz (88.3 kg)  10/02/18 167 lb (75.8 kg)  08/14/18 168 lb (76.2 kg)   Patient is in no distress; well developed, nourished and groomed; neck is supple  CARDIOVASCULAR: Examination  of carotid arteries is normal; no carotid bruits Regular rate and rhythm, no murmurs Examination of peripheral vascular system by observation and palpation is normal  EYES: Ophthalmoscopic exam of optic discs and posterior segments is normal; no papilledema or hemorrhages No results found.  MUSCULOSKELETAL: Gait, strength, tone, movements noted in Neurologic exam below  NEUROLOGIC: MENTAL STATUS:  No flowsheet data found. awake, alert, oriented to person, place and time recent and remote memory intact normal attention and concentration language fluent, comprehension intact, naming intact fund of knowledge appropriate  CRANIAL NERVE:  2nd - no papilledema on fundoscopic exam 2nd, 3rd, 4th, 6th - pupils equal and reactive to light, visual fields full to confrontation, extraocular muscles intact, no nystagmus 5th - facial sensation symmetric 7th - facial strength symmetric 8th - hearing intact 9th - palate elevates symmetrically, uvula midline 11th - shoulder shrug symmetric 12th - tongue protrusion midline MASKED FACIES; SOFT / HOARSE VOICE  MOTOR:  MILD RESTING TREMOR IN MOUTH AND HANDS NO COGWHEELING RIGIDITY MILD BRADYKINESIA IN BUE normal bulk and tone, full strength in the BUE, BLE  SENSORY:  normal and symmetric to light touch, temperature, vibration  COORDINATION:  finger-nose-finger, fine finger movements normal  REFLEXES:  deep tendon reflexes present and symmetric  GAIT/STATION:  narrow based gait; DECR ARM SWING IN RIGHT     DIAGNOSTIC DATA (LABS, IMAGING, TESTING) - I reviewed patient records, labs, notes, testing and imaging myself where available.  Lab Results  Component Value Date   WBC 8.6 10/01/2018   HGB 16.0 10/01/2018   HCT 47.5 10/01/2018   MCV 93.1 10/01/2018   PLT 239 10/01/2018      Component Value Date/Time   NA 140 10/01/2018 2342   K 4.0 10/01/2018 2342   CL 102 10/01/2018 2342   CO2 23 10/01/2018 2342   GLUCOSE 102 (H)  10/01/2018 2342   BUN 12 10/01/2018 2342   CREATININE 1.02 10/01/2018 2342   CALCIUM 9.3 10/01/2018 2342   PROT 7.6 10/01/2018 2342   ALBUMIN 4.2 10/01/2018 2342   AST 25 10/01/2018 2342   ALT 27 10/01/2018 2342   ALKPHOS 86 10/01/2018 2342   BILITOT 0.7 10/01/2018 2342   GFRNONAA >60 10/01/2018 2342   GFRAA >60 10/01/2018 2342   Lab Results  Component Value Date   CHOL 235 (H) 08/13/2018   HDL 41 08/13/2018   LDLCALC 152 (H) 08/13/2018   TRIG 210 (H) 08/13/2018   CHOLHDL 5.7 08/13/2018   Lab Results  Component Value Date   HGBA1C 5.4 08/13/2018   Lab Results  Component Value Date  [id number] 08/15/2018   Lab Results  Component Value Date   TSH 0.989 08/13/2018       ASSESSMENT AND PLAN  60 y.o. year old male here with:   Dx:  1. Drug-induced parkinsonism (HCC)   2. Schizoaffective disorder, depressive type ([place])     PLAN:  DRUG-INDUCED PARKINSONISM (prior haldol and abilify use; current quetiapine use; mild resting tremor in mouth and hands; masked facies; mild bradykinesia) - follow up with psychiatry; consider to lower quetiapine dosing if possible - ingrezza and benztropine not helping; consider to taper off - could consider levodopa in future, but caution due to schizoaffective d/o and history of psychosis  Return for pending if symptoms worsen or fail to improve, return to PCP.    Penni Bombard, MD [id number], 0000000 AM Certified in Neurology, Neurophysiology and Neuroimaging  [facility] [address], [place], [place] [address] [phone]

## 2020-12-20 ENCOUNTER — Encounter: Payer: Self-pay | Admitting: Diagnostic Neuroimaging

## 2021-03-23 IMAGING — CR LEFT FEMUR 2 VIEWS
4 series · 4 of 4 positions shown · non-contrast
Comparison: None.

CLINICAL DATA: Left leg pain after fall 3 weeks ago.

EXAM:
LEFT FEMUR 2 VIEWS

[t femur distal ap left]
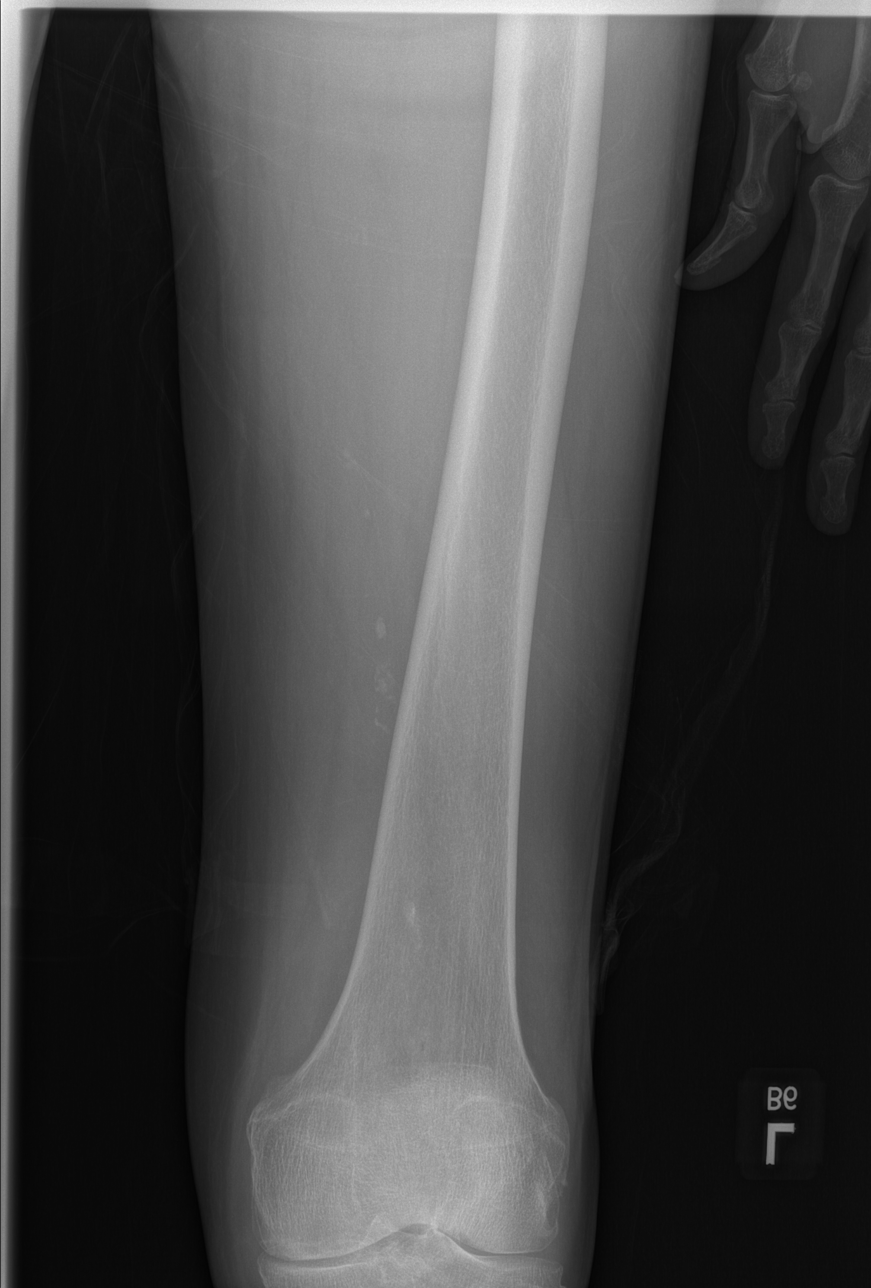

[t femur proximal lat left]
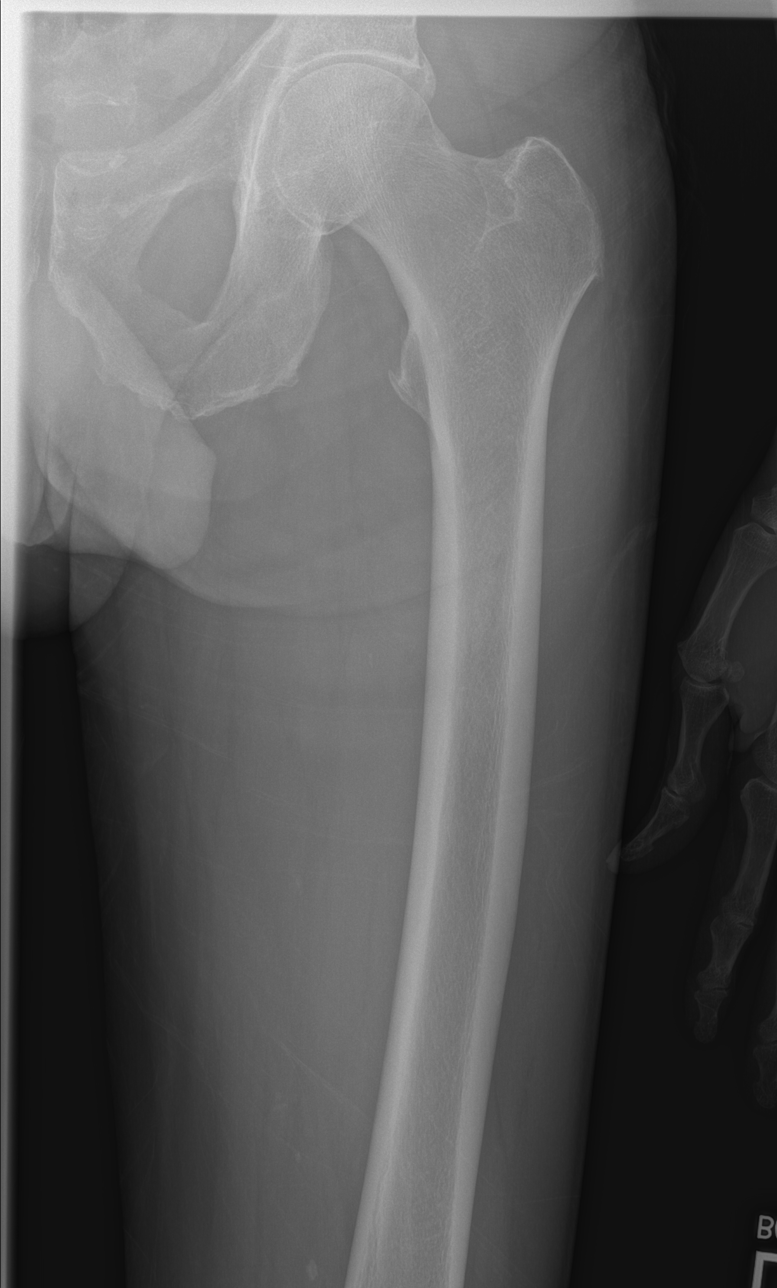

[t femur proximal ap left]
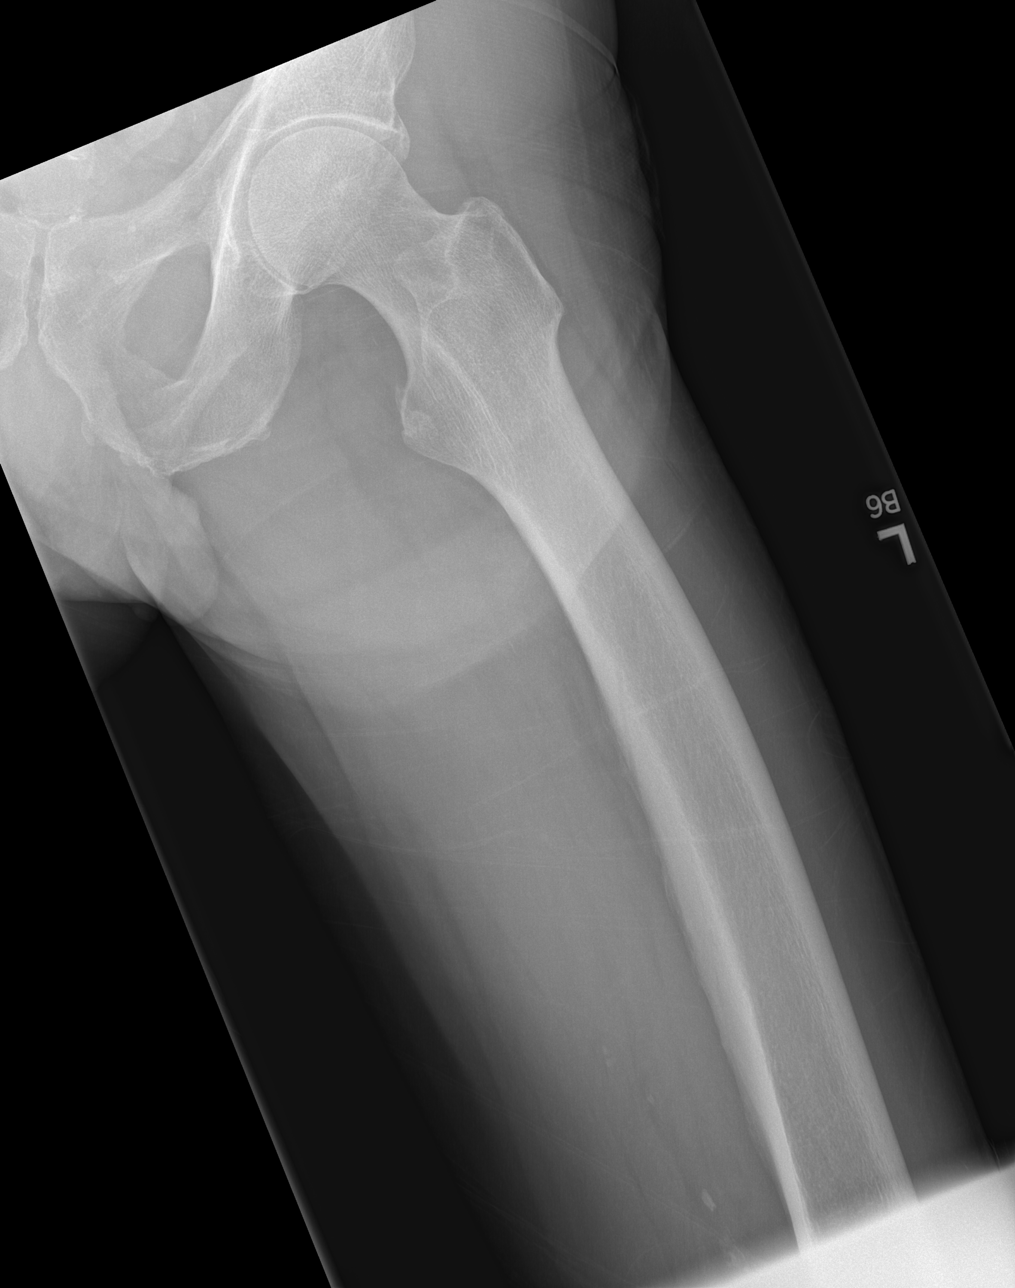

[t femur distal lat left]
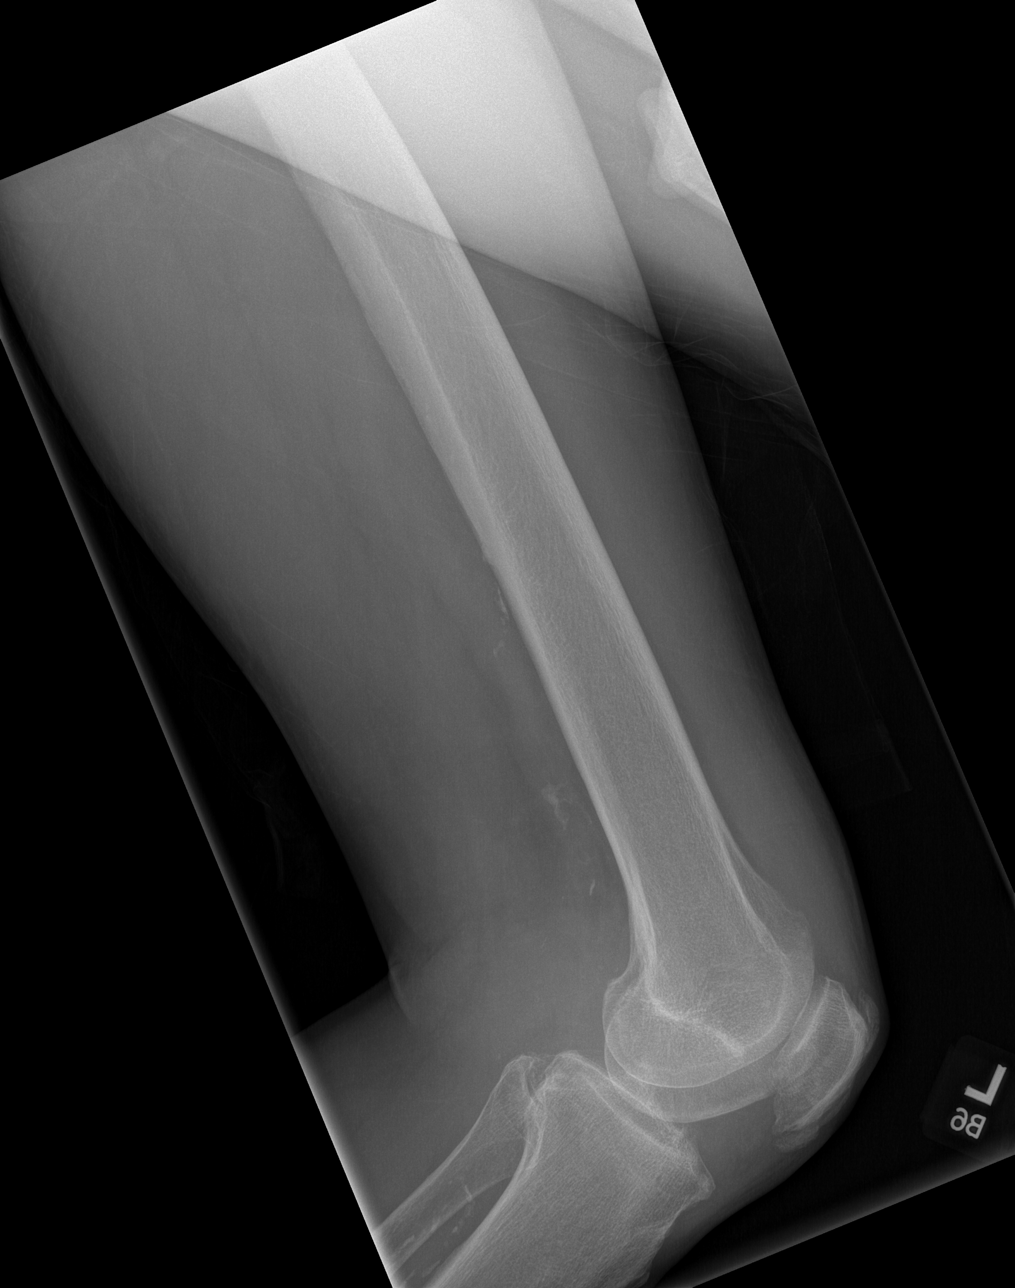

[4 of 4 positions shown; findings below may reference images not displayed]

FINDINGS: There is no evidence of fracture or other focal bone lesions. Soft
tissues are unremarkable.
IMPRESSION: Negative.

## 2021-08-01 ENCOUNTER — Ambulatory Visit (HOSPITAL_COMMUNITY)
Admission: EM | Admit: 2021-08-01 | Discharge: 2021-08-02 | Disposition: A | Discharge status: Other Acute Inpatient Hospital | Payer: Medicaid Other | Attending: Family | Admitting: Family

## 2021-08-01 ENCOUNTER — Encounter (HOSPITAL_COMMUNITY): Payer: Self-pay | Admitting: Emergency Medicine

## 2021-08-01 DIAGNOSIS — R45851 Suicidal ideations: Secondary | ICD-10-CM | POA: Insufficient documentation

## 2021-08-01 DIAGNOSIS — F411 Generalized anxiety disorder: Secondary | ICD-10-CM | POA: Insufficient documentation

## 2021-08-01 DIAGNOSIS — Z20822 Contact with and (suspected) exposure to covid-19: Secondary | ICD-10-CM | POA: Insufficient documentation

## 2021-08-01 DIAGNOSIS — G4700 Insomnia, unspecified: Secondary | ICD-10-CM | POA: Insufficient documentation

## 2021-08-01 DIAGNOSIS — F251 Schizoaffective disorder, depressive type: Secondary | ICD-10-CM | POA: Insufficient documentation

## 2021-08-01 DIAGNOSIS — R440 Auditory hallucinations: Secondary | ICD-10-CM

## 2021-08-01 DIAGNOSIS — Z9151 Personal history of suicidal behavior: Secondary | ICD-10-CM | POA: Insufficient documentation

## 2021-08-01 LAB — LIPID PANEL
Cholesterol: 238 mg/dL — ABNORMAL HIGH (ref 0–200)
HDL: 35 mg/dL — ABNORMAL LOW (ref >40)
LDL Cholesterol: UNDETERMINED mg/dL (ref 0–99)
Total CHOL/HDL Ratio: 6.8 RATIO
Triglycerides: 498 mg/dL — ABNORMAL HIGH (ref <150)
VLDL: UNDETERMINED mg/dL (ref 0–40)

## 2021-08-01 LAB — RESP PANEL BY RT-PCR (FLU A&B, COVID) ARPGX2
Influenza A by PCR: NEGATIVE
Influenza B by PCR: NEGATIVE
SARS Coronavirus 2 by RT PCR: NEGATIVE

## 2021-08-01 LAB — CBC WITH DIFFERENTIAL/PLATELET
Abs Immature Granulocytes: 0.01 10*3/uL (ref 0.00–0.07)
Basophils Absolute: 0.1 10*3/uL (ref 0.0–0.1)
Basophils Relative: 1 %
Eosinophils Absolute: 0.1 10*3/uL (ref 0.0–0.5)
Eosinophils Relative: 1 %
HCT: 54.1 % — ABNORMAL HIGH (ref 39.0–52.0)
Hemoglobin: 18 g/dL — ABNORMAL HIGH (ref 13.0–17.0)
Immature Granulocytes: 0 %
Lymphocytes Relative: 27 %
Lymphs Abs: 2.4 10*3/uL (ref 0.7–4.0)
MCH: 30.6 pg (ref 26.0–34.0)
MCHC: 33.3 g/dL (ref 30.0–36.0)
MCV: 91.9 fL (ref 80.0–100.0)
Monocytes Absolute: 0.7 10*3/uL (ref 0.1–1.0)
Monocytes Relative: 8 %
Neutro Abs: 5.5 10*3/uL (ref 1.7–7.7)
Neutrophils Relative %: 63 %
Platelets: 276 10*3/uL (ref 150–400)
RBC: 5.89 MIL/uL — ABNORMAL HIGH (ref 4.22–5.81)
RDW: 13.2 % (ref 11.5–15.5)
WBC: 8.8 10*3/uL (ref 4.0–10.5)
nRBC: 0 % (ref 0.0–0.2)

## 2021-08-01 LAB — POC SARS CORONAVIRUS 2 AG: SARSCOV2ONAVIRUS 2 AG: NEGATIVE

## 2021-08-01 LAB — COMPREHENSIVE METABOLIC PANEL
ALT: 28 U/L (ref 0–44)
AST: 19 U/L (ref 15–41)
Albumin: 4 g/dL (ref 3.5–5.0)
Alkaline Phosphatase: 89 U/L (ref 38–126)
Anion gap: 15 (ref 5–15)
BUN: 9 mg/dL (ref 6–20)
CO2: 21 mmol/L — ABNORMAL LOW (ref 22–32)
Calcium: 9.3 mg/dL (ref 8.9–10.3)
Chloride: 101 mmol/L (ref 98–111)
Creatinine, Ser: 0.77 mg/dL (ref 0.61–1.24)
GFR, Estimated: >60 mL/min (ref >60)
Glucose, Bld: 83 mg/dL (ref 70–99)
Potassium: 3.6 mmol/L (ref 3.5–5.1)
Sodium: 137 mmol/L (ref 135–145)
Total Bilirubin: 0.4 mg/dL (ref 0.3–1.2)
Total Protein: 8.2 g/dL — ABNORMAL HIGH (ref 6.5–8.1)

## 2021-08-01 LAB — POCT URINE DRUG SCREEN - MANUAL ENTRY (I-SCREEN)
POC Amphetamine UR: NOT DETECTED
POC Buprenorphine (BUP): NOT DETECTED
POC Cocaine UR: POSITIVE — AB
POC Marijuana UR: POSITIVE — AB
POC Methadone UR: NOT DETECTED
POC Methamphetamine UR: NOT DETECTED
POC Morphine: NOT DETECTED
POC Oxazepam (BZO): NOT DETECTED
POC Oxycodone UR: NOT DETECTED
POC Secobarbital (BAR): NOT DETECTED

## 2021-08-01 LAB — TSH: TSH: 1.273 u[IU]/mL (ref 0.350–4.500)

## 2021-08-01 LAB — LDL CHOLESTEROL, DIRECT: Direct LDL: 161.3 mg/dL — ABNORMAL HIGH (ref 0–99)

## 2021-08-01 MED ORDER — ALUM & MAG HYDROXIDE-SIMETH 200-200-20 MG/5ML PO SUSP: 30.0000 mL | ORAL | Status: DC | PRN

## 2021-08-01 MED ORDER — TRAZODONE HCL 50 MG PO TABS
50.0000 mg | ORAL_TABLET | Freq: Every evening | ORAL | Status: DC | PRN
  Administered 2021-08-01: 50 mg via ORAL
  Filled 2021-08-01: qty 1

## 2021-08-01 MED ORDER — ACETAMINOPHEN 325 MG PO TABS: 650.0000 mg | ORAL_TABLET | Freq: Four times a day (QID) | ORAL | Status: DC | PRN

## 2021-08-01 MED ORDER — MAGNESIUM HYDROXIDE 400 MG/5ML PO SUSP: 30.0000 mL | Freq: Every day | ORAL | Status: DC | PRN

## 2021-08-01 NOTE — ED Notes (Signed)
Pt A&O x 4, no distress noted, calm & cooperative. Remains SI, contracts for safety.  Monitoring for safety.

## 2021-08-02 ENCOUNTER — Inpatient Hospital Stay (HOSPITAL_COMMUNITY)
Admission: EM | Admit: 2021-08-02 | Discharge: 2021-08-08 | DRG: 885 | Disposition: A | Discharge status: Home / Self Care | Payer: Medicaid Other | Attending: Psychiatry | Admitting: Psychiatry

## 2021-08-02 ENCOUNTER — Encounter (HOSPITAL_COMMUNITY): Payer: Self-pay | Admitting: Family

## 2021-08-02 ENCOUNTER — Other Ambulatory Visit: Payer: Self-pay

## 2021-08-02 DIAGNOSIS — F251 Schizoaffective disorder, depressive type: Principal | ICD-10-CM | POA: Diagnosis present

## 2021-08-02 DIAGNOSIS — Z560 Unemployment, unspecified: Secondary | ICD-10-CM

## 2021-08-02 DIAGNOSIS — F17290 Nicotine dependence, other tobacco product, uncomplicated: Secondary | ICD-10-CM | POA: Diagnosis present

## 2021-08-02 DIAGNOSIS — R440 Auditory hallucinations: Secondary | ICD-10-CM | POA: Diagnosis present

## 2021-08-02 DIAGNOSIS — I10 Essential (primary) hypertension: Secondary | ICD-10-CM | POA: Diagnosis present

## 2021-08-02 DIAGNOSIS — Z9151 Personal history of suicidal behavior: Secondary | ICD-10-CM

## 2021-08-02 DIAGNOSIS — F1410 Cocaine abuse, uncomplicated: Secondary | ICD-10-CM | POA: Diagnosis present

## 2021-08-02 DIAGNOSIS — F1021 Alcohol dependence, in remission: Secondary | ICD-10-CM | POA: Diagnosis present

## 2021-08-02 DIAGNOSIS — F419 Anxiety disorder, unspecified: Secondary | ICD-10-CM | POA: Diagnosis present

## 2021-08-02 DIAGNOSIS — R45851 Suicidal ideations: Secondary | ICD-10-CM | POA: Diagnosis present

## 2021-08-02 DIAGNOSIS — G4700 Insomnia, unspecified: Secondary | ICD-10-CM | POA: Diagnosis present

## 2021-08-02 DIAGNOSIS — Z79899 Other long term (current) drug therapy: Secondary | ICD-10-CM

## 2021-08-02 DIAGNOSIS — G2401 Drug induced subacute dyskinesia: Secondary | ICD-10-CM | POA: Diagnosis present

## 2021-08-02 DIAGNOSIS — E782 Mixed hyperlipidemia: Secondary | ICD-10-CM | POA: Diagnosis present

## 2021-08-02 DIAGNOSIS — F1210 Cannabis abuse, uncomplicated: Secondary | ICD-10-CM | POA: Diagnosis present

## 2021-08-02 DIAGNOSIS — Z91148 Patient's other noncompliance with medication regimen for other reason: Secondary | ICD-10-CM

## 2021-08-02 DIAGNOSIS — Z62810 Personal history of physical and sexual abuse in childhood: Secondary | ICD-10-CM | POA: Diagnosis present

## 2021-08-02 HISTORY — DX: Essential (primary) hypertension: I10

## 2021-08-02 LAB — URINALYSIS, ROUTINE W REFLEX MICROSCOPIC
Bacteria, UA: NONE SEEN
Bilirubin Urine: NEGATIVE
Glucose, UA: NEGATIVE mg/dL
Ketones, ur: NEGATIVE mg/dL
Leukocytes,Ua: NEGATIVE
Nitrite: NEGATIVE
Protein, ur: NEGATIVE mg/dL
Specific Gravity, Urine: 1.014 (ref 1.005–1.030)
pH: 5 (ref 5.0–8.0)

## 2021-08-02 LAB — HEMOGLOBIN A1C
Hgb A1c MFr Bld: 5.8 % — ABNORMAL HIGH (ref 4.8–5.6)
Mean Plasma Glucose: 120 mg/dL

## 2021-08-02 LAB — PROLACTIN: Prolactin: 0.7 ng/mL — ABNORMAL LOW (ref 4.0–15.2)

## 2021-08-02 MED ORDER — TRAZODONE HCL 50 MG PO TABS
50.0000 mg | ORAL_TABLET | Freq: Every evening | ORAL | Status: DC | PRN
  Administered 2021-08-02 – 2021-08-03 (×2): 50 mg via ORAL
  Filled 2021-08-02 (×2): qty 1

## 2021-08-02 MED ORDER — FLUOXETINE HCL 20 MG PO CAPS
20.0000 mg | ORAL_CAPSULE | Freq: Every day | ORAL | Status: DC
  Administered 2021-08-02: 20 mg via ORAL
  Filled 2021-08-02: qty 1

## 2021-08-02 MED ORDER — QUETIAPINE FUMARATE 300 MG PO TABS
300.0000 mg | ORAL_TABLET | Freq: Every day | ORAL | Status: DC
  Administered 2021-08-03: 300 mg via ORAL
  Filled 2021-08-02 (×2): qty 1

## 2021-08-02 MED ORDER — ACETAMINOPHEN 325 MG PO TABS
650.0000 mg | ORAL_TABLET | Freq: Four times a day (QID) | ORAL | Status: DC | PRN
  Administered 2021-08-06: 650 mg via ORAL
  Filled 2021-08-02: qty 2

## 2021-08-02 MED ORDER — ALUM & MAG HYDROXIDE-SIMETH 200-200-20 MG/5ML PO SUSP: 30.0000 mL | ORAL | Status: DC | PRN

## 2021-08-02 MED ORDER — TRAZODONE HCL 50 MG PO TABS
50.0000 mg | ORAL_TABLET | Freq: Once | ORAL | Status: AC
  Administered 2021-08-02: 50 mg via ORAL
  Filled 2021-08-02: qty 1

## 2021-08-02 MED ORDER — MAGNESIUM HYDROXIDE 400 MG/5ML PO SUSP
30.0000 mL | Freq: Every day | ORAL | Status: DC | PRN
  Administered 2021-08-06 – 2021-08-07 (×2): 30 mL via ORAL
  Filled 2021-08-02 (×2): qty 30

## 2021-08-02 MED ORDER — PRAZOSIN HCL 2 MG PO CAPS
5.0000 mg | ORAL_CAPSULE | Freq: Every day | ORAL | Status: DC
  Administered 2021-08-02: 5 mg via ORAL
  Filled 2021-08-02: qty 1

## 2021-08-02 MED ORDER — VALBENAZINE TOSYLATE 40 MG PO CAPS
80.0000 mg | ORAL_CAPSULE | Freq: Every day | ORAL | Status: DC
  Administered 2021-08-03 – 2021-08-07 (×5): 80 mg via ORAL
  Filled 2021-08-02 (×8): qty 2
  Filled 2021-08-02: qty 14

## 2021-08-02 MED ORDER — FLUOXETINE HCL 20 MG PO CAPS
20.0000 mg | ORAL_CAPSULE | Freq: Every day | ORAL | Status: DC
  Administered 2021-08-03 – 2021-08-08 (×6): 20 mg via ORAL
  Filled 2021-08-02 (×3): qty 1
  Filled 2021-08-02: qty 7
  Filled 2021-08-02 (×5): qty 1

## 2021-08-02 MED ORDER — METHOCARBAMOL 750 MG PO TABS
750.0000 mg | ORAL_TABLET | Freq: Three times a day (TID) | ORAL | Status: DC
  Administered 2021-08-03 – 2021-08-08 (×17): 750 mg via ORAL
  Filled 2021-08-02: qty 21
  Filled 2021-08-02 (×2): qty 1
  Filled 2021-08-02: qty 21
  Filled 2021-08-02 (×3): qty 1
  Filled 2021-08-02: qty 21
  Filled 2021-08-02 (×17): qty 1

## 2021-08-02 MED ORDER — METHOCARBAMOL 750 MG PO TABS
750.0000 mg | ORAL_TABLET | Freq: Three times a day (TID) | ORAL | Status: DC
  Administered 2021-08-02 (×2): 750 mg via ORAL
  Filled 2021-08-02 (×2): qty 1

## 2021-08-02 MED ORDER — QUETIAPINE FUMARATE 300 MG PO TABS
300.0000 mg | ORAL_TABLET | Freq: Every day | ORAL | Status: DC
  Administered 2021-08-02: 300 mg via ORAL
  Filled 2021-08-02: qty 1

## 2021-08-02 MED ORDER — MIRTAZAPINE 15 MG PO TABS
15.0000 mg | ORAL_TABLET | Freq: Every day | ORAL | Status: DC
  Administered 2021-08-03 – 2021-08-07 (×5): 15 mg via ORAL
  Filled 2021-08-02 (×2): qty 1
  Filled 2021-08-02: qty 7
  Filled 2021-08-02 (×6): qty 1

## 2021-08-02 MED ORDER — PRAZOSIN HCL 5 MG PO CAPS
5.0000 mg | ORAL_CAPSULE | Freq: Every day | ORAL | Status: DC
  Administered 2021-08-03 – 2021-08-07 (×5): 5 mg via ORAL
  Filled 2021-08-02 (×7): qty 1
  Filled 2021-08-02: qty 7
  Filled 2021-08-02: qty 1

## 2021-08-02 MED ORDER — MIRTAZAPINE 15 MG PO TABS
15.0000 mg | ORAL_TABLET | Freq: Every day | ORAL | Status: DC
  Administered 2021-08-02: 15 mg via ORAL
  Filled 2021-08-02: qty 1

## 2021-08-02 MED ORDER — VALBENAZINE TOSYLATE 40 MG PO CAPS
80.0000 mg | ORAL_CAPSULE | Freq: Every day | ORAL | Status: DC
  Administered 2021-08-02: 80 mg via ORAL
  Filled 2021-08-02: qty 2

## 2021-08-02 NOTE — ED Notes (Signed)
Pt sleeping at present, no distress noted.  Monitoring for safety. 

## 2021-08-02 NOTE — ED Provider Notes (Signed)
Behavioral Health Progress Note  Date and Time: 08/02/2021 7:00 PM Name: Edwin Henry MRN:  [id number]  Subjective:  Edwin Henry 62 y.o., male patient presented to [facility] on 08/01/2021 with increased depression and SI and was admitted to the continuous assessment unit while awaiting inpatient psychiatric bed availability.  Edwin Henry, 61 y.o., male patient seen face to face by this provider, consulted with Dr. Bronwen Betters; and chart reviewed on 08/02/21.  Per chart review patient has a psychiatric history of schizophrenia, MDD, alcohol use disorder, GAD and insomnia.  He has services in place with [facility].  His evaluation Edwin Henry is observed laying in his bed awake.  He is very respectful with this Clinical research associate.  He is alert/oriented x4, cooperative, and attentive.  He has normal speech and behavior.  He makes good eye contact.  He has a jerking in his right arm at times when he is talking.  He endorses increased depression and anxiety.  Reports he has been unable to sleep. He endorses suicidal ideations with a plan to walk into traffic.  He cannot contract for safety.  He denies HI.  He endorses auditory hallucinations.  He hears voices that he does not recognize that tell him to kill himself and they scream bad things to him.  He is denying visual hallucinations at this time but has had visual hallucinations in the past.  He endorses some paranoia.  He does not appear to be responding to internal/external stimuli.   Patient continues to meet inpatient psychiatric admission criteria.  [facility] notified and there are no available beds at this time.  Social work notified patient has been faxed out.   Reached out to ACTT team and requested that medication list be provided.   Diagnosis:  Final diagnoses:  Suicidal ideation  Schizoaffective disorder, depressive type (HCC)  Auditory hallucination    Total Time spent with patient: 30 minutes  Past Psychiatric History: See H&P Past  Medical History:  Past Medical History:  Diagnosis Date   Insomnia disorder    Schizophrenia (HCC)     Past Surgical History:  Procedure Laterality Date   KNEE SURGERY Right    Family History: History reviewed. No pertinent family history. Family Psychiatric  History: See H&P Social History:  Social History   Substance and Sexual Activity  Alcohol Use Yes   Comment: 1-2 beers every 2 days     Social History   Substance and Sexual Activity  Drug Use Not Currently   Types: Marijuana   Comment: occasional THC use, 12/15/20 not now    Social History   Socioeconomic History   Marital status: Single    Spouse name: Not on file   Number of children: 1   Years of education: Not on file   Highest education level: 9th grade  Occupational History   Not on file  Tobacco Use   Smoking status: Former    Packs/day: 0.50    Types: Cigarettes    Quit date: 11/07/2019    Years since quitting: 1.7   Smokeless tobacco: Current    Types: Chew  Vaping Use   Vaping Use: Never used  Substance and Sexual Activity   Alcohol use: Yes    Comment: 1-2 beers every 2 days   Drug use: Not Currently    Types: Marijuana    Comment: occasional THC use, 12/15/20 not now   Sexual activity: Yes    Birth control/protection: Condom  Other Topics Concern   Not on  file  Social History Narrative   Completed 9th grade special ed   12/13/20 lives alone in apt   Caffeine- sodas 2 a day, occas energy drink    Social Determinants of Health   Financial Resource Strain: Not on file  Food Insecurity: Not on file  Transportation Needs: Not on file  Physical Activity: Not on file  Stress: Not on file  Social Connections: Not on file   SDOH:  SDOH Screenings   Alcohol Screen: Low Risk  (10/02/2018)   Alcohol Screen    Last Alcohol Screening Score (AUDIT): 4  Depression (PHQ2-9): Not on file  Financial Resource Strain: Not on file  Food Insecurity: Not on file  Housing: Not on file  Physical  Activity: Not on file  Social Connections: Not on file  Stress: Not on file  Tobacco Use: High Risk (08/01/2021)   Patient History    Smoking Tobacco Use: Former    Smokeless Tobacco Use: Current    Passive Exposure: Not on file  Transportation Needs: Not on file   Additional Social History:                         Sleep: Poor  Appetite:  Good  Current Medications:  Current Facility-Administered Medications  Medication Dose Route Frequency Provider Last Rate Last Admin   acetaminophen (TYLENOL) tablet 650 mg  650 mg Oral Q6H PRN Oneta Rack, NP       alum & mag hydroxide-simeth (MAALOX/MYLANTA) 200-200-20 MG/5ML suspension 30 mL  30 mL Oral Q4H PRN Oneta Rack, NP       FLUoxetine (PROZAC) capsule 20 mg  20 mg Oral Daily Vernard Gambles H, NP   20 mg at 08/02/21 1718   magnesium hydroxide (MILK OF MAGNESIA) suspension 30 mL  30 mL Oral Daily PRN Oneta Rack, NP       methocarbamol (ROBAXIN) tablet 750 mg  750 mg Oral TID Ardis Hughs, NP   750 mg at 08/02/21 1718   mirtazapine (REMERON) tablet 15 mg  15 mg Oral QHS Ardis Hughs, NP       prazosin (MINIPRESS) capsule 5 mg  5 mg Oral QHS Ardis Hughs, NP       QUEtiapine (SEROQUEL) tablet 300 mg  300 mg Oral QHS Ardis Hughs, NP       valbenazine [facility]) capsule 80 mg  80 mg Oral QHS Ardis Hughs, NP       Current Outpatient Medications  Medication Sig Dispense Refill   FLUoxetine (PROZAC) 20 MG capsule Take 20 mg by mouth at bedtime.      Labs  Lab Results:  Admission on 08/01/2021  Component Date Value Ref Range Status   SARS Coronavirus 2 by RT PCR 08/01/2021 NEGATIVE  NEGATIVE Final   Comment: (NOTE) SARS-CoV-2 target nucleic acids are NOT DETECTED.  The SARS-CoV-2 RNA is generally detectable in upper respiratory specimens during the acute phase of infection. The lowest concentration of SARS-CoV-2 viral copies this assay can detect is 138 copies/mL. A negative  result does not preclude SARS-Cov-2 infection and should not be used as the sole basis for treatment or other patient management decisions. A negative result may occur with  improper specimen collection/handling, submission of specimen other than nasopharyngeal swab, presence of viral mutation(s) within the areas targeted by this assay, and inadequate number of viral copies(<138 copies/mL). A negative result must be combined with clinical observations, patient history, and  epidemiological information. The expected result is Negative.  Fact Sheet for Patients:  BloggerCourse.comhttps://www.fda.gov/media/152166/download  Fact Sheet for Healthcare Providers:  SeriousBroker.ithttps://www.fda.gov/media/152162/download  This test is no                          t yet approved or cleared by the [place] FDA and  has been authorized for detection and/or diagnosis of SARS-CoV-2 by FDA under an Emergency Use Authorization (EUA). This EUA will remain  in effect (meaning this test can be used) for the duration of the COVID-19 declaration under Section 564(b)(1) of the Act, 21 U.S.C.section 360bbb-3(b)(1), unless the authorization is terminated  or revoked sooner.       Influenza A by PCR 08/01/2021 NEGATIVE  NEGATIVE Final   Influenza B by PCR 08/01/2021 NEGATIVE  NEGATIVE Final   Comment: (NOTE) The Xpert Xpress SARS-CoV-2/FLU/RSV plus assay is intended as an aid in the diagnosis of influenza from Nasopharyngeal swab specimens and should not be used as a sole basis for treatment. Nasal washings and aspirates are unacceptable for Xpert Xpress SARS-CoV-2/FLU/RSV testing.  Fact Sheet for Patients: BloggerCourse.comhttps://www.fda.gov/media/152166/download  Fact Sheet for Healthcare Providers: SeriousBroker.ithttps://www.fda.gov/media/152162/download  This test is not yet approved or cleared by the [place] FDA and has been authorized for detection and/or diagnosis of SARS-CoV-2 by FDA under an Emergency Use Authorization (EUA). This EUA will  remain in effect (meaning this test can be used) for the duration of the COVID-19 declaration under Section 564(b)(1) of the Act, 21 U.S.C. section 360bbb-3(b)(1), unless the authorization is terminated or revoked.  Performed at [facility], [address] [place], [place] [address]    WBC 08/01/2021 8.8  4.0 - 10.5 K/uL Final   RBC 08/01/2021 5.89 (H)  4.22 - 5.81 MIL/uL Final   Hemoglobin 08/01/2021 18.0 (H)  13.0 - 17.0 g/dL Final   HCT 10/27/253606/26/2023 54.1 (H)  39.0 - 52.0 % Final   MCV 08/01/2021 91.9  80.0 - 100.0 fL Final   MCH 08/01/2021 30.6  26.0 - 34.0 pg Final   MCHC 08/01/2021 33.3  30.0 - 36.0 g/dL Final   RDW 64/40/347406/26/2023 13.2  11.5 - 15.5 % Final   Platelets 08/01/2021 276  150 - 400 K/uL Final   nRBC 08/01/2021 0.0  0.0 - 0.2 % Final   Neutrophils Relative % 08/01/2021 63  % Final   Neutro Abs 08/01/2021 5.5  1.7 - 7.7 K/uL Final   Lymphocytes Relative 08/01/2021 27  % Final   Lymphs Abs 08/01/2021 2.4  0.7 - 4.0 K/uL Final   Monocytes Relative 08/01/2021 8  % Final   Monocytes Absolute 08/01/2021 0.7  0.1 - 1.0 K/uL Final   Eosinophils Relative 08/01/2021 1  % Final   Eosinophils Absolute 08/01/2021 0.1  0.0 - 0.5 K/uL Final   Basophils Relative 08/01/2021 1  % Final   Basophils Absolute 08/01/2021 0.1  0.0 - 0.1 K/uL Final   Immature Granulocytes 08/01/2021 0  % Final   Abs Immature Granulocytes 08/01/2021 0.01  0.00 - 0.07 K/uL Final   Performed at [facility], [address] [place], [place] [address]   Sodium 08/01/2021 137  135 - 145 mmol/L Final   Potassium 08/01/2021 3.6  3.5 - 5.1 mmol/L Final   Chloride 08/01/2021 101  98 - 111 mmol/L Final   CO2 08/01/2021 21 (L)  22 - 32 mmol/L Final   Glucose, Bld 08/01/2021 83  70 - 99 mg/dL  Final   Glucose reference range applies only to samples taken after fasting for at least 8 hours.   BUN 08/01/2021 9  6 - 20 mg/dL Final   Creatinine, Ser 08/01/2021 0.77  0.61 - 1.24 mg/dL Final   Calcium 14/78/2956  9.3  8.9 - 10.3 mg/dL Final   Total Protein 21/30/8657 8.2 (H)  6.5 - 8.1 g/dL Final   Albumin 84/69/6295 4.0  3.5 - 5.0 g/dL Final   AST 28/41/3244 19  15 - 41 U/L Final   ALT 08/01/2021 28  0 - 44 U/L Final   Alkaline Phosphatase 08/01/2021 89  38 - 126 U/L Final   Total Bilirubin 08/01/2021 0.4  0.3 - 1.2 mg/dL Final   GFR, Estimated 08/01/2021 >60  >60 mL/min Final   Comment: (NOTE) Calculated using the CKD-EPI Creatinine Equation (2021)    Anion gap 08/01/2021 15  5 - 15 Final   Performed at [facility], [address] [place], [place] [address]   Hgb A1c MFr Bld 08/01/2021 5.8 (H)  4.8 - 5.6 % Final   Comment: (NOTE)         Prediabetes: 5.7 - 6.4         Diabetes: >6.4         Glycemic control for adults with diabetes: <7.0    Mean Plasma Glucose 08/01/2021 120  mg/dL Final   Comment: (NOTE) Performed At: [facility] [address] [place], [place] [id number] Jolene Schimke MD KV:[id number]    Cholesterol 08/01/2021 238 (H)  0 - 200 mg/dL Final   Triglycerides 64/33/2951 498 (H)  <150 mg/dL Final   HDL 88/41/6606 35 (L)  >40 mg/dL Final   Total CHOL/HDL Ratio 08/01/2021 6.8  RATIO Final   VLDL 08/01/2021 UNABLE TO CALCULATE IF TRIGLYCERIDE OVER 400 mg/dL  0 - 40 mg/dL Final   LDL Cholesterol 08/01/2021 UNABLE TO CALCULATE IF TRIGLYCERIDE OVER 400 mg/dL  0 - 99 mg/dL Final   Comment:        Total Cholesterol/HDL:CHD Risk Coronary Heart Disease Risk Table                     Men   Women  1/2 Average Risk   3.4   3.3  Average Risk       5.0   4.4  2 X Average Risk   9.6   7.1  3 X Average Risk  23.4   11.0        Use the calculated Patient Ratio above and the CHD Risk Table to determine the patient's CHD Risk.        ATP III CLASSIFICATION (LDL):  <100     mg/dL   Optimal  301-601  mg/dL   Near or Above                    Optimal  130-159  mg/dL   Borderline  093-235  mg/dL   High  >573     mg/dL   Very High Performed at [facility],  [address], [place], [place] [address]    TSH 08/01/2021 1.273  0.350 - 4.500 uIU/mL Final   Comment: Performed by a 3rd Generation assay with a functional sensitivity of <=0.01 uIU/mL. Performed at [facility], [address], [place], [place] [address]    Prolactin 08/01/2021 0.7 (L)  4.0 - 15.2 ng/mL Final   Comment: (NOTE) Performed At: [facility] [address] [place], [place] [id number] Jolene Schimke MD QP:[id number]    POC Amphetamine UR 08/01/2021 None Detected  NONE DETECTED (Cut Off Level 1000 ng/mL) Final   POC Secobarbital (BAR) 08/01/2021 None Detected  NONE DETECTED (Cut Off Level 300 ng/mL) Final   POC Buprenorphine (BUP) 08/01/2021 None Detected  NONE DETECTED (Cut Off Level 10 ng/mL) Final   POC Oxazepam (BZO) 08/01/2021 None Detected  NONE DETECTED (Cut Off Level 300 ng/mL) Final   POC Cocaine UR 08/01/2021 Positive (A)  NONE DETECTED (Cut Off Level 300 ng/mL) Final   POC Methamphetamine UR 08/01/2021 None Detected  NONE DETECTED (Cut Off Level 1000 ng/mL) Final   POC Morphine 08/01/2021 None Detected  NONE DETECTED (Cut Off Level 300 ng/mL) Final   POC Methadone UR 08/01/2021 None Detected  NONE DETECTED (Cut Off Level 300 ng/mL) Final   POC Oxycodone UR 08/01/2021 None Detected  NONE DETECTED (Cut Off Level 100 ng/mL) Final   POC Marijuana UR 08/01/2021 Positive (A)  NONE DETECTED (Cut Off Level 50 ng/mL) Final   SARSCOV2ONAVIRUS 2 AG 08/01/2021 NEGATIVE  NEGATIVE Final   Comment: (NOTE) SARS-CoV-2 antigen NOT DETECTED.   Negative results are presumptive.  Negative results do not preclude SARS-CoV-2 infection and should not be used as the sole basis for treatment or other patient management decisions, including infection  control decisions, particularly in the presence of clinical signs and  symptoms consistent with COVID-19, or in those who have been in contact with the virus.  Negative results must be combined with clinical observations, patient  history, and epidemiological information. The expected result is Negative.  Fact Sheet for Patients: https://www.jennings-kim.com/  Fact Sheet for Healthcare Providers: https://alexander-rogers.biz/  This test is not yet approved or cleared by the [place] FDA and  has been authorized for detection and/or diagnosis of SARS-CoV-2 by FDA under an Emergency Use Authorization (EUA).  This EUA will remain in effect (meaning this test can be used) for the duration of  the COV                          ID-19 declaration under Section 564(b)(1) of the Act, 21 U.S.C. section 360bbb-3(b)(1), unless the authorization is terminated or revoked sooner.     Direct LDL 08/01/2021 161.3 (H)  0 - 99 mg/dL Final   Performed at [facility], [address] [place], [place] [address]   Color, Urine 08/02/2021 YELLOW  YELLOW Final   APPearance 08/02/2021 CLEAR  CLEAR Final   Specific Gravity, Urine 08/02/2021 1.014  1.005 - 1.030 Final   pH 08/02/2021 5.0  5.0 - 8.0 Final   Glucose, UA 08/02/2021 NEGATIVE  NEGATIVE mg/dL Final   Hgb urine dipstick 08/02/2021 MODERATE (A)  NEGATIVE Final   Bilirubin Urine 08/02/2021 NEGATIVE  NEGATIVE Final   Ketones, ur 08/02/2021 NEGATIVE  NEGATIVE mg/dL Final   Protein, ur 01/77/9390 NEGATIVE  NEGATIVE mg/dL Final   Nitrite 30/10/2328 NEGATIVE  NEGATIVE Final   Leukocytes,Ua 08/02/2021 NEGATIVE  NEGATIVE Final   RBC / HPF 08/02/2021 0-5  0 - 5 RBC/hpf Final   WBC, UA 08/02/2021 0-5  0 - 5 WBC/hpf Final   Bacteria, UA 08/02/2021 NONE SEEN  NONE SEEN Final   Squamous Epithelial / LPF 08/02/2021 0-5  0 - 5 Final   Performed at [facility], [address], [place], [place] [address]    Blood Alcohol level:  Lab Results  Component Value Date  ETH 100 (H) 10/01/2018   ETH <10 08/13/2018    Metabolic Disorder Labs: Lab Results  Component Value Date   HGBA1C 5.8 (H) 08/01/2021   MPG 120 08/01/2021   MPG 108.28  08/13/2018   Lab Results  Component Value Date   PROLACTIN 0.7 (L) 08/01/2021   PROLACTIN 22.1 (H) 10/11/2017   Lab Results  Component Value Date   CHOL 238 (H) 08/01/2021   TRIG 498 (H) 08/01/2021   HDL 35 (L) 08/01/2021   CHOLHDL 6.8 08/01/2021   VLDL UNABLE TO CALCULATE IF TRIGLYCERIDE OVER 400 mg/dL 62/83/1517   LDLCALC UNABLE TO CALCULATE IF TRIGLYCERIDE OVER 400 mg/dL 61/60/7371   LDLCALC 062 (H) 08/13/2018    Therapeutic Lab Levels: No results found for: "LITHIUM" No results found for: "VALPROATE" No results found for: "CBMZ"  Physical Findings   AIMS    Flowsheet Row [facility]) [facility] Ad[facility] (Discharged) from OP Visit [facility] Ad[facility] (Discharged) from [facility] Ad[facility] (Discharged) [facility]  AIMS Total Score 0 0 0 0      AUDIT    Flowsheet Row Ad[facility] ([facility]) [facility] Admission (Discharged) from OP Visit from [facility] Admission (Discharged) from 05/22/2018 [facility] Admission (Discharged) from [facility]  Alcohol Use Disorder Identification Test Final Score (AUDIT) 4 4 2 4       Flowsheet Row ED from 08/01/2021 in [facility] Admission (Discharged) from [facility] Admission (Discharged) from OP Visit from [facility]  C-SSRS RISK CATEGORY Low Risk Moderate Risk Error: Q6 is Yes, you must answer 7        Musculoskeletal  Strength & Muscle Tone: within normal limits Gait & Station: normal Patient leans: N/A  Psychiatric Specialty Exam   Presentation  General Appearance: Appropriate for Environment; Casual  Eye Contact:Good  Speech:Clear and Coherent; Normal Rate  Speech Volume:Normal  Handedness:Right   Mood and Affect  Mood:Anxious; Depressed  Affect:Congruent   Thought Process  Thought Processes:Coherent  Descriptions of Associations:Intact  Orientation:Full (Time, Place and Person)  Thought Content:Logical     Hallucinations:Hallucinations: None; Auditory Description of Auditory Hallucinations: hears voices that  tell him bad things and tell him to kill hiself  Ideas of Reference:None  Suicidal Thoughts:Suicidal Thoughts: Yes, Active SI Active Intent and/or Plan: With Intent; With Plan; With Means to Carry Out  Homicidal Thoughts:Homicidal Thoughts: No   Sensorium  Memory:Immediate Good; Recent Good; Remote Good  Judgment:Fair  Insight:Fair   Executive Functions  Concentration:Good  Attention Span:Good  Recall:Good  Fund of Knowledge:Good  Language:Good   Psychomotor Activity  Psychomotor Activity:Psychomotor Activity: Normal   Assets  Assets:Communication Skills; Desire for Improvement; Leisure Time; Physical Health; Resilience   Sleep  Sleep:Sleep: Fair   Nutritional Assessment (For OBS and [facility] admissions only) Has the patient had a weight loss or gain of 10 pounds or more in the last 3 months?: No Has the patient had a decrease in food intake/or appetite?: No Does the patient have dental problems?: No Does the patient have eating habits or behaviors that may be indicators of an eating disorder including binging or inducing vomiting?: No Has the patient recently lost  weight without trying?: 0 Has the patient been eating poorly because of a decreased appetite?: 0 Malnutrition Screening Tool Score: 0    Physical Exam  Physical Exam Vitals and nursing note reviewed.  Constitutional:      Appearance: Normal appearance. He is well-developed.  HENT:     Head:  Normocephalic and atraumatic.  Eyes:     General:        Right eye: No discharge.        Left eye: No discharge.     Conjunctiva/sclera: Conjunctivae normal.  Cardiovascular:     Rate and Rhythm: Normal rate.  Pulmonary:     Effort: Pulmonary effort is normal. No respiratory distress.  Musculoskeletal:        General: Normal range of motion.     Cervical back: Normal range of motion.  Neurological:     Mental Status: He is alert and oriented to person, place, and time.  Psychiatric:        Attention and Perception: Perception normal.        Mood and Affect: Mood is anxious and depressed.        Speech: Speech normal.        Behavior: Behavior is cooperative.        Thought Content: Thought content includes suicidal ideation. Thought content includes suicidal plan.        Cognition and Memory: Cognition normal.        Judgment: Judgment normal.    Review of Systems  Constitutional: Negative.   HENT: Negative.    Eyes: Negative.   Respiratory: Negative.    Cardiovascular: Negative.   Musculoskeletal: Negative.   Skin: Negative.   Neurological: Negative.   Psychiatric/Behavioral:  Positive for depression, hallucinations and suicidal ideas. The patient is nervous/anxious.    Blood pressure 140/88, pulse 62, temperature 97.8 F (36.6 C), temperature source Oral, resp. rate 16, SpO2 100 %. There is no height or weight on file to calculate BMI.  Treatment Plan Summary: Disposition: Patient continues to meet inpatient psychiatric admission criteria.  [facility] notified and there is no bed availability.  Social work notified and patient has been faxed out.  We will continue to have daily contact with patient to assess and evaluate symptoms and progress in treatment and Medication management.  Awaiting medication list from ACTT team  Ardis Hughs, NP 08/02/2021 7:00 PM

## 2021-08-02 NOTE — Tx Team (Signed)
Initial Treatment Plan 08/02/2021 11:01 PM Rumi Flexer ZOX:[id number]    PATIENT STRESSORS: Marital or family conflict   Medication change or noncompliance   Substance abuse     PATIENT STRENGTHS: Capable of independent living  General fund of knowledge  Motivation for treatment/growth  Supportive family/friends    PATIENT IDENTIFIED PROBLEMS: Risk for SI  Psychosis (AH)  depression  "Tremors, meds right"               DISCHARGE CRITERIA:  Improved stabilization in mood, thinking, and/or behavior Verbal commitment to aftercare and medication compliance  PRELIMINARY DISCHARGE PLAN: Attend aftercare/continuing care group Outpatient therapy  PATIENT/FAMILY INVOLVEMENT: This treatment plan has been presented to and reviewed with the patient, [name].  The patient and family have been given the opportunity to ask questions and make suggestions.  Delos Haring, RN 08/02/2021, 11:01 PM

## 2021-08-02 NOTE — ED Notes (Signed)
Patient resting quietly in bed with eyes closed, Respirations equal and unlabored, skin warm and dry, NAD. Routine safety checks conducted according to facility protocol. Will continue to monitor for safety. 

## 2021-08-02 NOTE — Progress Notes (Signed)
Inpatient Behavioral Health Placement  Pt meets inpatient criteria per Melvyn Neth, NP. There are no available beds at [facility] per [facility] Fransico Michael, RN. Referral was sent to the following facilities;   Destination Service Provider Address Phone Fax  [facility]  [address] [place] [address] [phone]  [facility] [address]  [address] [place] [address] [phone]  [facility]  [address] [place] [address] [phone]  [facility]  [address] [place], [place] [address] [phone]  [facility]  [address] [place] [address] [phone]  [facility]  [address] [place] [address] [phone]  [facility]  [address] [place] [address] [phone]  [facility]  [address] [place] [address] [phone]  [facility]  [address], [place] [address] [phone]  [facility]  [address], [place] [address] [phone]  [facility]  [address] [place] [address] [phone]  [facility]  [address], [place] [address] [phone]  [facility]  [address] [place] [address] [phone]  [facility]  [address] [place] [address] [phone]    Situation ongoing,  CSW will follow up.   Maryjean Ka, MSW, LCSWA 08/02/2021  @ 12:50 AM

## 2021-08-02 NOTE — ED Notes (Signed)
Pt A&O x 4, resting at present, no distress noted.  Contracts for safety, passive SI noted.  Monitoring for safety.

## 2021-08-02 NOTE — Progress Notes (Signed)
Patient ID: Edwin Henry, male   DOB: Feb 15, 1960, 61 y.o.   MRN: [id number] Admission Note:  D:60 yr male who presents [facility] in no acute distress for the treatment of SI/ AH and Depression. Pt appears flat and depressed. Pt was calm and cooperative with admission process. Pt presents with passive SI/AH and contracts for safety upon admission. Pt denies HI/ VH / Pain at this time. Pt stated ACTT team was checking on him and he stated to them "I at my breaking point" pt stated he was dealing with tremors from unknown origin for the past 6 months and felt like his medications were not working due to pt having increased negative voices  which were causing pt to be SI.   Per Assessment: 61 year old male that presents to [facility] urgent care accompanied by ACT team member due to reported suicidal ideations with plan to walk into traffic.  Patient has a charted history with schizophrenia, major depressive disorder,  alcohol use disorder, generalized anxiety disorder and insomnia.  reports he has been taking medications as indicated. "  I do not think they are working."  Patient reports hearing voices that are yelling/screaming at him.  States voices are command in nature. spoke to [place] with [facility] ACTT service reported  that patient mental health appears to be due to deteriorating due to lack of sleep.  He reports patient has had multiple medication adjustments without success.  States " hit or miss with long-acting injectable."  Reports he has only had 2 shots this year due to dyskinesia.  Reports patient's is taken Ingrezza.  A:Skin was assessed and found to be clear of any abnormal marks apart from rash on Abdomen and chest area. PT searched and no contraband found, POC and unit policies explained and understanding verbalized. Consents obtained. Food and fluids offered, and fluids accepted.   R:Pt had no additional questions or concerns.

## 2021-08-02 NOTE — ED Notes (Signed)
Safe Transport called 

## 2021-08-02 NOTE — ED Provider Notes (Addendum)
Received medication list by fax from [facility].  Home medications restarted this encounter:       DISCONTD: traZODone (DESYREL) tablet 50 mg      methocarbamol (ROBAXIN) tablet 750 mg TID   QUEtiapine (SEROQUEL) tablet 300 mg QHS   mirtazapine (REMERON) tablet 15 mg QHS   prazosin (MINIPRESS) capsule 5 mg QHS   FLUoxetine (PROZAC) capsule 20 mg QD   valbenazine (INGREZZA) capsule 80 mg QHS

## 2021-08-03 ENCOUNTER — Encounter (HOSPITAL_COMMUNITY): Payer: Self-pay

## 2021-08-03 DIAGNOSIS — F1410 Cocaine abuse, uncomplicated: Secondary | ICD-10-CM

## 2021-08-03 DIAGNOSIS — F1210 Cannabis abuse, uncomplicated: Secondary | ICD-10-CM

## 2021-08-03 MED ORDER — ENSURE ENLIVE PO LIQD
237.0000 mL | Freq: Two times a day (BID) | ORAL | Status: DC
  Administered 2021-08-03 – 2021-08-08 (×10): 237 mL via ORAL
  Filled 2021-08-03 (×14): qty 237

## 2021-08-03 NOTE — Progress Notes (Signed)
   08/03/21 0500  Sleep  Number of Hours 4.5

## 2021-08-03 NOTE — Group Note (Signed)
[facility] LCSW Group Therapy Note  Date/Time: 08/03/2021 @ 1pm  Type of Therapy and Topic:  Group Therapy:  Communication  Participation Level:  Did not attend  Description of Group:    In this group patients will be encouraged to explore how individuals communicate with one another appropriately and inappropriately. Patients will be guided to discuss their thoughts, feelings, and behaviors related to barriers communicating feelings, needs, and stressors. The group will process together ways to execute positive and appropriate communications, with attention given to how one use behavior, tone, and body language to communicate. Patient will be encouraged to reflect on an incident where they were successfully able to communicate and the factors that they believe helped them to communicate. Each patient will be encouraged to identify specific changes they are motivated to make in order to overcome communication barriers with self, peers, authority, and parents. This group will be process-oriented, with patients participating in exploration of their own experiences as well as giving and receiving support and challenging self as well as other group members.  Therapeutic Goals: Patient will identify how people communicate (body language, facial expression, and electronics) Also discuss tone, voice and how these impact what is communicated and how the message is perceived.  Patient will identify feelings (such as fear or worry), thought process and behaviors related to why people internalize feelings rather than express self openly. Patient will identify two changes they are willing to make to overcome communication barriers. Members will then practice through Role Play how to communicate by utilizing psycho-education material (such as I Feel statements and acknowledging feelings rather than displacing on others)   Summary of Patient Progress: Did not attend     Therapeutic Modalities:   Cognitive  Behavioral Therapy Solution Focused Therapy Motivational Interviewing Family Systems Approach   Virda Betters, LCSW, LCAS Clincal Social Worker  [facility]   

## 2021-08-03 NOTE — Progress Notes (Signed)
Pt presents with flat affect, depressed mood, logical and soft speech. Isolative to room majority of this shift. Visible in milieu for medications and meals. Denies HI, VH and pain when assessed. Reports fair sleep last night related to [facility] "I came in late too". Continues to endorse passive SI, verbally contracts for safety. Reports +AH that are louder at night and when around others. Did not attend scheduled groups despite multiple prompts. Support, reassurance and encouragement provided to pt. Safety checks maintained at Q 15 minutes intervals without incident. Pt tolerated all PO intake and medications well without discomfort.

## 2021-08-03 NOTE — H&P (Addendum)
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 health follow-up including medication management/psychotherapy At time of discharge will refer patient to primary care provider to address hyperlipidemia and to continue to follow hemoglobin A1c given patient will continue to need antipsychotics prescribed.    The patient is agreeable with the medication plan, as above. We will monitor the patient's response to pharmacologic treatment, and adjust medications as necessary. Patient is encouraged to participate in group therapy while  admitted to the psychiatric unit. We will address other chronic and acute stressors, which contributed to the patient's worsening hallucinations, depression and suicidal ideation, in order to reduce the risk of self-harm at discharge.   Physician Treatment Plan for Primary Diagnosis: Schizoaffective disorder, depressive type ([place]) Long Term Goal(s): Improvement in symptoms so as ready for discharge  Short Term Goals: Ability to identify changes in lifestyle to reduce recurrence of condition will improve, Ability to verbalize feelings will improve, Ability to disclose and discuss suicidal ideas, and Ability to demonstrate self-control will improve   I certify that inpatient services furnished can reasonably be expected to improve the patient's condition.    Total Time Spent in Direct Patient Care:  I personally spent 55 minutes on the unit in direct patient care. The direct patient care time included face-to-face time with the patient, reviewing the patient's chart, communicating with other professionals, and coordinating care. Greater than 50% of this time was spent in counseling or coordinating care with the patient regarding goals of hospitalization, psycho-education, and discharge planning needs.     Jaionna Weisse Winfred Leeds, MD 6/28/20233:40 PM

## 2021-08-03 NOTE — [facility] Group Notes (Signed)
Patient did not attend morning orientation group.  

## 2021-08-03 NOTE — Progress Notes (Signed)
Pt did not attend group. 

## 2021-08-03 NOTE — Progress Notes (Signed)
   08/03/21 2100  Psych Admission Type (Psych Patients Only)  Admission Status Voluntary  Psychosocial Assessment  Patient Complaints Anxiety;Depression;Suspiciousness;Shakiness  Eye Contact Fair  Facial Expression Flat  Affect Sad;Preoccupied  Speech Soft;Slow  Interaction Assertive  Motor Activity Slow  Appearance/Hygiene Unremarkable  Behavior Characteristics Cooperative  Mood Preoccupied;Anxious;Suspicious  Aggressive Behavior  Effect No apparent injury  Thought Process  Coherency WDL  Content WDL  Delusions None reported or observed  Perception Hallucinations  Hallucination Auditory  Judgment Impaired  Confusion None  Danger to Others  Danger to Others None reported or observed

## 2021-08-03 NOTE — BH IP Treatment Plan (Signed)
Interdisciplinary Treatment and Diagnostic Plan Update  08/03/2021 Time of Session: 10:10am Edwin Henry MRN: [id number]  Principal Diagnosis: Schizoaffective disorder, depressive type (HCC)  Secondary Diagnoses: Principal Problem:   Schizoaffective disorder, depressive type (HCC)   Current Medications:  Current Facility-Administered Medications  Medication Dose Route Frequency Provider Last Rate Last Admin   acetaminophen (TYLENOL) tablet 650 mg  650 mg Oral Q6H PRN Onuoha, Chinwendu V, NP       alum & mag hydroxide-simeth (MAALOX/MYLANTA) 200-200-20 MG/5ML suspension 30 mL  30 mL Oral Q4H PRN Onuoha, Chinwendu V, NP       feeding supplement (ENSURE ENLIVE / ENSURE PLUS) liquid 237 mL  237 mL Oral BID BM Pashayan, Mardelle Matte, MD       FLUoxetine (PROZAC) capsule 20 mg  20 mg Oral Daily Onuoha, Chinwendu V, NP   20 mg at 08/03/21 0867   magnesium hydroxide (MILK OF MAGNESIA) suspension 30 mL  30 mL Oral Daily PRN Onuoha, Chinwendu V, NP       methocarbamol (ROBAXIN) tablet 750 mg  750 mg Oral TID Onuoha, Chinwendu V, NP   750 mg at 08/03/21 1257   mirtazapine (REMERON) tablet 15 mg  15 mg Oral QHS Onuoha, Chinwendu V, NP       prazosin (MINIPRESS) capsule 5 mg  5 mg Oral QHS Onuoha, Chinwendu V, NP       QUEtiapine (SEROQUEL) tablet 300 mg  300 mg Oral QHS Onuoha, Chinwendu V, NP       traZODone (DESYREL) tablet 50 mg  50 mg Oral QHS PRN Onuoha, Chinwendu V, NP   50 mg at 08/02/21 2300   valbenazine (INGREZZA) capsule 80 mg  80 mg Oral QHS Onuoha, Chinwendu V, NP       PTA Medications: Medications Prior to Admission  Medication Sig Dispense Refill Last Dose   FLUoxetine (PROZAC) 20 MG capsule Take 20 mg by mouth at bedtime.       Patient Stressors: Marital or family conflict   Medication change or noncompliance   Substance abuse    Patient Strengths: Capable of independent living  General fund of knowledge  Motivation for treatment/growth  Supportive family/friends    Treatment Modalities: Medication Management, Group therapy, Case management,  1 to 1 session with clinician, Psychoeducation, Recreational therapy.   Physician Treatment Plan for Primary Diagnosis: Schizoaffective disorder, depressive type (HCC) Long Term Goal(s):     Short Term Goals:    Medication Management: Evaluate patient's response, side effects, and tolerance of medication regimen.  Therapeutic Interventions: 1 to 1 sessions, Unit Group sessions and Medication administration.  Evaluation of Outcomes: Not Progressing  Physician Treatment Plan for Secondary Diagnosis: Principal Problem:   Schizoaffective disorder, depressive type (HCC)  Long Term Goal(s):     Short Term Goals:       Medication Management: Evaluate patient's response, side effects, and tolerance of medication regimen.  Therapeutic Interventions: 1 to 1 sessions, Unit Group sessions and Medication administration.  Evaluation of Outcomes: Not Progressing   RN Treatment Plan for Primary Diagnosis: Schizoaffective disorder, depressive type (HCC) Long Term Goal(s): Knowledge of disease and therapeutic regimen to maintain health will improve  Short Term Goals: Ability to remain free from injury will improve, Ability to verbalize frustration and anger appropriately will improve, Ability to demonstrate self-control, Ability to participate in decision making will improve, Ability to verbalize feelings will improve, Ability to disclose and discuss suicidal ideas, Ability to identify and develop effective coping behaviors will improve, and Compliance  with prescribed medications will improve  Medication Management: RN will administer medications as ordered by provider, will assess and evaluate patient's response and provide education to patient for prescribed medication. RN will report any adverse and/or side effects to prescribing provider.  Therapeutic Interventions: 1 on 1 counseling sessions, Psychoeducation,  Medication administration, Evaluate responses to treatment, Monitor vital signs and CBGs as ordered, Perform/monitor CIWA, COWS, AIMS and Fall Risk screenings as ordered, Perform wound care treatments as ordered.  Evaluation of Outcomes: Progressing   LCSW Treatment Plan for Primary Diagnosis: Schizoaffective disorder, depressive type (HCC) Long Term Goal(s): Safe transition to appropriate next level of care at discharge, Engage patient in therapeutic group addressing interpersonal concerns.  Short Term Goals: Engage patient in aftercare planning with referrals and resources, Increase social support, Increase ability to appropriately verbalize feelings, Increase emotional regulation, Facilitate acceptance of mental health diagnosis and concerns, Facilitate patient progression through stages of change regarding substance use diagnoses and concerns, Identify triggers associated with mental health/substance abuse issues, and Increase skills for wellness and recovery  Therapeutic Interventions: Assess for all discharge needs, 1 to 1 time with Social worker, Explore available resources and support systems, Assess for adequacy in community support network, Educate family and significant other(s) on suicide prevention, Complete Psychosocial Assessment, Interpersonal group therapy.  Evaluation of Outcomes: Progressing   Progress in Treatment: Attending groups: No. Participating in groups: No. Taking medication as prescribed: Yes. Toleration medication: Yes. Family/Significant other contact made: No, will contact:  CSW will assess and identify support Patient understands diagnosis: Yes. Discussing patient identified problems/goals with staff: Yes. Medical problems stabilized or resolved: Yes. Denies suicidal/homicidal ideation: Yes. Issues/concerns per patient self-inventory: No.   New problem(s) identified: No, Describe:  none reported   New Short Term/Long Term Goal(s):   medication  stabilization, elimination of SI thoughts, development of comprehensive mental wellness plan.    Patient Goals:  Patient states, "help the voices."  Discharge Plan or Barriers: Patient recently admitted. CSW will continue to follow and assess for appropriate referrals and possible discharge planning.    Reason for Continuation of Hospitalization: Anxiety Delusions  Depression Hallucinations Medication stabilization  Estimated Length of Stay: 5-7 days  Last 3 [place] Suicide Severity Risk Score: Flowsheet Row [facility]2021 in [facility]Dis[facility]  C-SSRS RISK CATEGORY Low Risk Low Risk Moderate Risk       Last PHQ 2/9 Scores:     No data to display          Scribe for Treatment Team: Beatris Si, LCSW 08/03/2021 2:29 PM

## 2021-08-04 MED ORDER — TRAZODONE HCL 100 MG PO TABS
100.0000 mg | ORAL_TABLET | Freq: Every evening | ORAL | Status: DC | PRN
  Administered 2021-08-04: 100 mg via ORAL
  Filled 2021-08-04: qty 1

## 2021-08-04 MED ORDER — QUETIAPINE FUMARATE 400 MG PO TABS
400.0000 mg | ORAL_TABLET | Freq: Every day | ORAL | Status: DC
  Administered 2021-08-04: 400 mg via ORAL
  Filled 2021-08-04 (×2): qty 1
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
## 2021-08-04 NOTE — Progress Notes (Signed)
   08/04/21 0500  Sleep  Number of Hours 8.5

## 2021-08-04 NOTE — Group Note (Signed)
Recreation Therapy Group Note   Group Topic:Self-Esteem  Group Date: 08/04/2021 Start Time: 1005 End Time: 1045 Facilitators: Caroll Rancher, LRT,CTRS Location: [address] Dayroom   Goal Area(s) Addresses:  Patient will identify and write at least one positive trait about themself. Patient will successfully identify influential people in their life and why they admire them. Patient will acknowledge the benefit of healthy self-esteem. Patient will endorse understanding of ways to increase self-esteem.   Group Description:   LRT began group session with open dialogue asking the patients to define self-esteem and verbally identify positive qualities and traits people may possess. Patients were then instructed to design a personalized license plate, with words and drawings, representing at least 3 positive things about themselves. Pts were encouraged to include favorites, things they are proud of, what they enjoy doing, and dreams for their future. If a patient had a life motto or a meaningful phase that expressed their life values, pt's were asked to incorporate that into their design as well. Patients were given the opportunity to share their completed work with the group.   Affect/Mood: N/A   Participation Level: Did not attend    Clinical Observations/Individualized Feedback:     Plan: Continue to engage patient in RT group sessions 2-3x/week.   Caroll Rancher, Antonietta Jewel 08/04/2021 12:23 PM

## 2021-08-04 NOTE — Progress Notes (Signed)
   08/04/21 2100  Psych Admission Type (Psych Patients Only)  Admission Status Voluntary  Psychosocial Assessment  Patient Complaints Anxiety  Eye Contact Fair  Facial Expression Flat  Affect Sad;Preoccupied  Speech Soft;Slow  Interaction Assertive  Motor Activity Slow  Appearance/Hygiene Unremarkable  Behavior Characteristics Cooperative  Mood Depressed  Aggressive Behavior  Effect No apparent injury  Thought Process  Coherency WDL  Content WDL  Delusions None reported or observed  Perception Hallucinations  Hallucination Auditory  Judgment Impaired  Confusion None  Danger to Self  Current suicidal ideation? Passive  Self-Injurious Behavior Self-injurious ideation with potentially lethal plan observed or expressed  Agreement Not to Harm Self Yes  Description of Agreement verbal contract for safety  Danger to Others  Danger to Others None reported or observed
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
## 2021-08-04 NOTE — Progress Notes (Signed)
The focus of this group is to help patients review their daily goal of treatment and discuss progress on daily workbooks.  Pt attended the evening group and responded to all discussion prompts from the Writer. Pt told that today was a difficult day on the unit due to persistent voices, which have been troubling him. "I did wake up this morning, though. I can be glad for that."  Pt told that, upon discharge, he planned to take better care of himself by taking his medications consistently. "I stopped taking them before, which was a mistake."  Pt rated his day a 3 out of 10 and his affect was appropriate.

## 2021-08-04 NOTE — Progress Notes (Signed)
Recreation Therapy Notes  INPATIENT RECREATION THERAPY ASSESSMENT  Patient Details Name: Edwin Henry MRN: [id number] DOB: March 13, 1960 Today's Date: 08/04/2021       Information Obtained From: Patient  Able to Participate in Assessment/Interview: Yes  Patient Presentation:  (Depressed)  Reason for Admission (Per Patient): Other (Comments) ("voices")  Patient Stressors: Other (Comment) ("voices")  Coping Skills:   Isolation, Self-Injury, TV, Music, Exercise, Meditate, Talk, Prayer, Avoidance, Hot Bath/Shower  Leisure Interests (2+):  Community - Other (Comment) (Park)  Frequency of Recreation/Participation: Weekly  Awareness of Community Resources:  Yes  Community Resources:  Park, Tree surgeon  Current Use: Yes  If no, Barriers?:    Expressed Interest in [employer] Information: No  [employer] of Residence:  Engineer, technical sales  Patient Main Form of Transportation: Therapist, music  Patient Strengths:  "I have no idea"  Patient Identified Areas of Improvement:  "no"  Patient Goal for Hospitalization:  "leave out a better person than when I came in"  Current SI (including self-harm):     Current HI:  No  Current AVH: Yes (Hearing loud voices telling him they are going to get him sooner or later.)  Staff Intervention Plan: Group Attendance, Collaborate with Interdisciplinary Treatment Team  Consent to Intern Participation: N/A   Caroll Rancher, Richardean Sale, Lalaine Overstreet A 08/04/2021, 2:07 PM

## 2021-08-05 MED ORDER — TRAZODONE HCL 150 MG PO TABS: 150.0000 mg | ORAL_TABLET | Freq: Every evening | ORAL | Status: DC | PRN

## 2021-08-05 MED ORDER — QUETIAPINE FUMARATE ER 300 MG PO TB24
600.0000 mg | ORAL_TABLET | Freq: Every day | ORAL | Status: DC
  Administered 2021-08-05 – 2021-08-06 (×2): 600 mg via ORAL
  Filled 2021-08-05 (×6): qty 2

## 2021-08-05 NOTE — Progress Notes (Signed)
   08/05/21 0500  Sleep  Number of Hours 8.75

## 2021-08-05 NOTE — Group Note (Signed)
LCSW Group Therapy Note   Group Date: 08/05/2021 Start Time: 1300 End Time: 1400  Type of Therapy: Group Therapy: Boundaries   Participation Level: Did Not Attend   Description of Group: This group will address the use of boundaries in their personal lives. Patients will explore why boundaries are important, the difference between healthy and unhealthy boundaries, and negative and postive outcomes of different boundaries and will look at how boundaries can be crossed.  Patients will be encouraged to identify current boundaries in their own lives and identify what kind of boundary is being set. Facilitators will guide patients in utilizing problem-solving interventions to address and correct types boundaries being used and to address when no boundary is being used. Understanding and applying boundaries will be explored and addressed for obtaining and maintaining a balanced life. Patients will be encouraged to explore ways to assertively make their boundaries and needs known to significant others in their lives, using other group members and facilitator for role play, support, and feedback.   Therapeutic Goals: 1. Patient will identify areas in their life where setting clear boundaries could be used to improve their life.  2. Patient will identify signs/triggers that a boundary is not being respected. 3. Patient will identify two ways to set boundaries in order to achieve balance in their lives: 4. Patient will demonstrate ability to communicate their needs and set boundaries through discussion and/or role plays   Summary of Progress/Problems: The Pt attended the group and remained there the entire time.  The Pt was appropriate with their peers and open to discussion and varying opinions.  The Pt accepted and followed along with the worksheets that were provided.  The Pt demonstrated understanding of the topic being discussed.    Aram Beecham, LCSWA 08/05/2021  1:56 PM
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
## 2021-08-05 NOTE — Progress Notes (Signed)
Reports poor sleep related to auditory hallucinations "It gets loud, harsh especially at night. It bothers me to sleep". Pt rates his anxiety and depression both 6/10 "I'm ok right now. I don't need anything". Remains medication compliant. However, he did verbalized concerns about his medications (psychotropics & Robaxin) not being effective for him "It's not helping me at all like it used to". States his appetite is fair. Affect remains flat / sullen with depressed mood. Assigned provider is aware. Medication changes made to current regimen.  Emotional support, encouragement and reassurance provided to pt. Safety checks maintained at Q 15  Minutes intervals without incident. All medications administered with verbal education and effects monitored. Pt attended scheduled groups, cooperative with care thus far.

## 2021-08-06 NOTE — [facility] Group Notes (Signed)
.  Psychoeducational Group Note  Date: 08/06/2021 Time: 0900-1000    Goal Setting   Purpose of Group: This group helps to provide patients with the steps of setting a goal that is specific, measurable, attainable, realistic and time specific. A discussion on how we keep ourselves stuck with negative self talk. Homework given for Patients to write 30 positive attributes about themselves.    Participation Level:  did not attend Additional Comments:  ...  Dione Housekeeper

## 2021-08-06 NOTE — Group Note (Addendum)
[facility] LCSW Group Therapy Note  Date/Time:    08/06/2021 10:00-11:00AM  Type of Therapy and Topic:  Group Therapy:  Shame and its Impact on My Life  Participation Level:  Did Not Attend   Description of Group:  The focus of this group was to examine our tendency to be hyper-critical of self and how this leads to feelings of worthlessness, hopelessness, and shame.  Patients were guided to the concept that shame is universal and is worsened by being kept hidden, but improved by being revealed.  We discussed how feeling unworthy is the result of shame and discussed the differences between guilt  ("I did something bad" "I made a mistake" "I did something stupid") and shame ("I am bad" "I am a mistake" "I am stupid") .  We discussed that feelings are not necessarily based in facts.  We also talked about what happens when we do something to numb our negative feelings and how that actually numbs our positive feelings at the same time.  Therapeutic Goals Identify statements patients automatically say to themselves, "I'll be worthy when...."  Examine how this is unkind to ourselves because it indicates we cannot be worthy until some far-reaching, possibly even unreachable, goal is achieved. Talk about the frequent use of unhealthy coping skills used when feeling unworthy Allow patients to discuss their shame out loud in order to reduce its power over them  Summary of Patient Progress: At the beginning of the group, patient completed the following sentence:  " I will be worthy when I can get trust back from certain people."  He sat quietly throughout the remainder of group without volunteering any comments, did not watch the discussion as it moved around the room, but did remain alert.  Therapeutic Modalities Processing  Ambrose Mantle, LCSW 08/06/2021, 1:51 PM

## 2021-08-06 NOTE — [facility] Group Notes (Signed)
The focus of this group is to help patients review their daily goal of treatment and discuss progress on daily workbooks.  Pt did not participate in this group
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
## 2021-08-06 NOTE — Progress Notes (Signed)
Pt did not attend AA meeting 

## 2021-08-06 NOTE — Plan of Care (Signed)
Patient alert and oriented x3. Patient takes medications as prescribed. Denies pain at present, denies SI/HI. Patient endorses auditory hallucinations but he states they are getting better, but he is not quite at his baseline. Patient reports no distress this shift. Patient is being encouraged to go to groups and meals. Will continue to provide verbal encouragement and continue q55min checks.    Problem: Education: Goal: Knowledge of [facility] Education information/materials will improve Outcome: Progressing Goal: Emotional status will improve Outcome: Progressing Goal: Mental status will improve Outcome: Progressing Goal: Verbalization of understanding the information provided will improve Outcome: Progressing   Problem: Activity: Goal: Interest or engagement in activities will improve Outcome: Progressing Goal: Sleeping patterns will improve Outcome: Progressing   Problem: Coping: Goal: Ability to verbalize frustrations and anger appropriately will improve Outcome: Progressing Goal: Ability to demonstrate self-control will improve Outcome: Progressing   Problem: Health Behavior/Discharge Planning: Goal: Identification of resources available to assist in meeting health care needs will improve Outcome: Progressing Goal: Compliance with treatment plan for underlying cause of condition will improve Outcome: Progressing   Problem: Physical Regulation: Goal: Ability to maintain clinical measurements within normal limits will improve Outcome: Progressing   Problem: Safety: Goal: Periods of time without injury will increase Outcome: Progressing   Problem: Activity: Goal: Will verbalize the importance of balancing activity with adequate rest periods Outcome: Progressing   Problem: Education: Goal: Will be free of psychotic symptoms Outcome: Progressing Goal: Knowledge of the prescribed therapeutic regimen will improve Outcome: Progressing   Problem: Coping: Goal:  Coping ability will improve Outcome: Progressing Goal: Will verbalize feelings Outcome: Progressing   Problem: Health Behavior/Discharge Planning: Goal: Compliance with prescribed medication regimen will improve Outcome: Progressing   Problem: Nutritional: Goal: Ability to achieve adequate nutritional intake will improve Outcome: Progressing   Problem: Role Relationship: Goal: Ability to communicate needs accurately will improve Outcome: Progressing Goal: Ability to interact with others will improve Outcome: Progressing   Problem: Safety: Goal: Ability to redirect hostility and anger into socially appropriate behaviors will improve Outcome: Progressing Goal: Ability to remain free from injury will improve Outcome: Progressing   Problem: Self-Care: Goal: Ability to participate in self-care as condition permits will improve Outcome: Progressing   Problem: Self-Concept: Goal: Will verbalize positive feelings about self Outcome: Progressing   Problem: Education: Goal: Utilization of techniques to improve thought processes will improve Outcome: Progressing Goal: Knowledge of the prescribed therapeutic regimen will improve Outcome: Progressing   Problem: Activity: Goal: Interest or engagement in leisure activities will improve Outcome: Progressing Goal: Imbalance in normal sleep/wake cycle will improve Outcome: Progressing   Problem: Coping: Goal: Coping ability will improve Outcome: Progressing Goal: Will verbalize feelings Outcome: Progressing   Problem: Health Behavior/Discharge Planning: Goal: Ability to make decisions will improve Outcome: Progressing Goal: Compliance with therapeutic regimen will improve Outcome: Progressing   Problem: Role Relationship: Goal: Will demonstrate positive changes in social behaviors and relationships Outcome: Progressing   Problem: Safety: Goal: Ability to disclose and discuss suicidal ideas will improve Outcome:  Progressing Goal: Ability to identify and utilize support systems that promote safety will improve Outcome: Progressing   Problem: Self-Concept: Goal: Will verbalize positive feelings about self Outcome: Progressing Goal: Level of anxiety will decrease Outcome: Progressing

## 2021-08-06 NOTE — [facility] Group Notes (Signed)
.  Psychoeducational Group Note    Date:  7///23 Time: 1300-1400    Purpose of Group: . The group focus' on teaching patients on how to identify their needs and their Life Skills:  Henry group where two lists are made. What people need and what are things that we do that are unhealthy. The lists are developed by the patients and it is explained that we often do the actions that are not healthy to get our list of needs met.  Goal:: to develop the coping skills needed to get their needs met  Participation Level:  did not attend  Edwin Henry  

## 2021-08-07 MED ORDER — LACTULOSE 10 GM/15ML PO SOLN
30.0000 g | Freq: Once | ORAL | Status: AC
  Administered 2021-08-07: 30 g via ORAL
  Filled 2021-08-07: qty 45

## 2021-08-07 MED ORDER — QUETIAPINE FUMARATE ER 400 MG PO TB24
800.0000 mg | ORAL_TABLET | Freq: Every day | ORAL | Status: DC
  Administered 2021-08-07: 800 mg via ORAL
  Filled 2021-08-07 (×3): qty 2

## 2021-08-07 NOTE — Plan of Care (Signed)
Compliant with medications this morning. Reports he didn't sleep well due to the voices in his head. Denies SI/HI, patient states he is still hearing voices, they are  better than before but still preventing sleep. Provider made aware. Denies any pain at present. Taking medications as prescribed. Will continue to monitor q60mins and provide verbal redirection.    Problem: Education: Goal: Knowledge of [facility] Education information/materials will improve Outcome: Progressing Goal: Emotional status will improve Outcome: Progressing Goal: Mental status will improve Outcome: Progressing Goal: Verbalization of understanding the information provided will improve Outcome: Progressing   Problem: Activity: Goal: Interest or engagement in activities will improve Outcome: Progressing Goal: Sleeping patterns will improve Outcome: Progressing   Problem: Coping: Goal: Ability to verbalize frustrations and anger appropriately will improve Outcome: Progressing Goal: Ability to demonstrate self-control will improve Outcome: Progressing   Problem: Health Behavior/Discharge Planning: Goal: Identification of resources available to assist in meeting health care needs will improve Outcome: Progressing Goal: Compliance with treatment plan for underlying cause of condition will improve Outcome: Progressing   Problem: Physical Regulation: Goal: Ability to maintain clinical measurements within normal limits will improve Outcome: Progressing   Problem: Safety: Goal: Periods of time without injury will increase Outcome: Progressing   Problem: Activity: Goal: Will verbalize the importance of balancing activity with adequate rest periods Outcome: Progressing   Problem: Education: Goal: Will be free of psychotic symptoms Outcome: Progressing Goal: Knowledge of the prescribed therapeutic regimen will improve Outcome: Progressing   Problem: Coping: Goal: Coping ability will  improve Outcome: Progressing   Problem: Coping: Goal: Coping ability will improve Outcome: Progressing Goal: Will verbalize feelings Outcome: Progressing   Problem: Health Behavior/Discharge Planning: Goal: Compliance with prescribed medication regimen will improve Outcome: Progressing   Problem: Nutritional: Goal: Ability to achieve adequate nutritional intake will improve Outcome: Progressing   Problem: Role Relationship: Goal: Ability to communicate needs accurately will improve Outcome: Progressing Goal: Ability to interact with others will improve Outcome: Progressing   Problem: Safety: Goal: Ability to redirect hostility and anger into socially appropriate behaviors will improve Outcome: Progressing Goal: Ability to remain free from injury will improve Outcome: Progressing   Problem: Self-Care: Goal: Ability to participate in self-care as condition permits will improve Outcome: Progressing   Problem: Self-Concept: Goal: Will verbalize positive feelings about self Outcome: Progressing   Problem: Education: Goal: Utilization of techniques to improve thought processes will improve Outcome: Progressing Goal: Knowledge of the prescribed therapeutic regimen will improve Outcome: Progressing   Problem: Activity: Goal: Interest or engagement in leisure activities will improve Outcome: Progressing Goal: Imbalance in normal sleep/wake cycle will improve Outcome: Progressing   Problem: Coping: Goal: Coping ability will improve Outcome: Progressing Goal: Will verbalize feelings Outcome: Progressing   Problem: Health Behavior/Discharge Planning: Goal: Ability to make decisions will improve Outcome: Progressing Goal: Compliance with therapeutic regimen will improve Outcome: Progressing   Problem: Role Relationship: Goal: Will demonstrate positive changes in social behaviors and relationships Outcome: Progressing   Problem: Safety: Goal: Ability to disclose  and discuss suicidal ideas will improve Outcome: Progressing Goal: Ability to identify and utilize support systems that promote safety will improve Outcome: Progressing   Problem: Self-Concept: Goal: Will verbalize positive feelings about self Outcome: Progressing Goal: Level of anxiety will decrease Outcome: Progressing

## 2021-08-07 NOTE — [facility] Group Notes (Signed)
Adult Psychoeducational Group  Date:  08/07/2021 Time:  1300-1400  Group Topic/Focus: Continuation of the group from Saturday. Looking at the lists that were created and talking about what needs to be done with the homework of 30 positives about themselves.                                     Talking about taking their power back and helping themselves to develop a positive self esteem.      Participation Quality:  limited  Affect:  flat  Cognitive:  Oriented  Insight: Improving  Engagement in Group:  semi Engaged  Modes of Intervention:  Activity, Discussion, Education, and Support  Additional Comments:  Rates his energy as a 6/7. Pt stays quiet in the room and not interact in the group. He appears to be listening to what is being said and discussed.   Dione Housekeeper

## 2021-08-07 NOTE — [facility] Group Notes (Signed)
Pt attended an orientation and rules group. 

## 2021-08-07 NOTE — Plan of Care (Signed)
Problem: Education: Goal: Knowledge of [facility] Education information/materials will improve 08/07/2021 1917 by Sharen Hint, RN Outcome: Progressing 08/07/2021 0921 by Sharen Hint, RN Outcome: Progressing Goal: Emotional status will improve 08/07/2021 1917 by Sharen Hint, RN Outcome: Progressing 08/07/2021 0921 by Sharen Hint, RN Outcome: Progressing Goal: Mental status will improve 08/07/2021 1917 by Sharen Hint, RN Outcome: Progressing 08/07/2021 0921 by Sharen Hint, RN Outcome: Progressing Goal: Verbalization of understanding the information provided will improve 08/07/2021 1917 by Sharen Hint, RN Outcome: Progressing 08/07/2021 0921 by Sharen Hint, RN Outcome: Progressing   Problem: Activity: Goal: Interest or engagement in activities will improve 08/07/2021 1917 by Sharen Hint, RN Outcome: Progressing 08/07/2021 0921 by Sharen Hint, RN Outcome: Progressing Goal: Sleeping patterns will improve 08/07/2021 1917 by Sharen Hint, RN Outcome: Progressing 08/07/2021 0921 by Sharen Hint, RN Outcome: Progressing   Problem: Coping: Goal: Ability to verbalize frustrations and anger appropriately will improve 08/07/2021 1917 by Sharen Hint, RN Outcome: Progressing 08/07/2021 0921 by Sharen Hint, RN Outcome: Progressing Goal: Ability to demonstrate self-control will improve 08/07/2021 1917 by Sharen Hint, RN Outcome: Progressing 08/07/2021 0921 by Sharen Hint, RN Outcome: Progressing   Problem: Health Behavior/Discharge Planning: Goal: Identification of resources available to assist in meeting health care needs will improve 08/07/2021 1917 by Sharen Hint, RN Outcome: Progressing 08/07/2021 0921 by Sharen Hint, RN Outcome: Progressing Goal: Compliance with treatment plan for underlying cause of condition will improve 08/07/2021 1917 by Sharen Hint, RN Outcome: Progressing 08/07/2021 0921 by Sharen Hint,  RN Outcome: Progressing   Problem: Physical Regulation: Goal: Ability to maintain clinical measurements within normal limits will improve 08/07/2021 1917 by Sharen Hint, RN Outcome: Progressing 08/07/2021 0921 by Sharen Hint, RN Outcome: Progressing   Problem: Safety: Goal: Periods of time without injury will increase 08/07/2021 1917 by Sharen Hint, RN Outcome: Progressing 08/07/2021 0921 by Sharen Hint, RN Outcome: Progressing   Problem: Activity: Goal: Will verbalize the importance of balancing activity with adequate rest periods 08/07/2021 1917 by Sharen Hint, RN Outcome: Progressing 08/07/2021 0921 by Sharen Hint, RN Outcome: Progressing   Problem: Education: Goal: Will be free of psychotic symptoms 08/07/2021 1917 by Sharen Hint, RN Outcome: Progressing 08/07/2021 0921 by Sharen Hint, RN Outcome: Progressing Goal: Knowledge of the prescribed therapeutic regimen will improve 08/07/2021 1917 by Sharen Hint, RN Outcome: Progressing 08/07/2021 0921 by Sharen Hint, RN Outcome: Progressing   Problem: Coping: Goal: Coping ability will improve 08/07/2021 1917 by Sharen Hint, RN Outcome: Progressing 08/07/2021 0921 by Sharen Hint, RN Outcome: Progressing Goal: Will verbalize feelings 08/07/2021 1917 by Sharen Hint, RN Outcome: Progressing 08/07/2021 0921 by Sharen Hint, RN Outcome: Progressing   Problem: Health Behavior/Discharge Planning: Goal: Compliance with prescribed medication regimen will improve 08/07/2021 1917 by Sharen Hint, RN Outcome: Progressing 08/07/2021 0921 by Sharen Hint, RN Outcome: Progressing   Problem: Role Relationship: Goal: Ability to communicate needs accurately will improve 08/07/2021 1917 by Sharen Hint, RN Outcome: Progressing 08/07/2021 0921 by Sharen Hint, RN Outcome: Progressing Goal: Ability to interact with others will improve 08/07/2021 1917 by Sharen Hint, RN Outcome:  Progressing 08/07/2021 0921 by Sharen Hint, RN Outcome: Progressing   Problem: Safety: Goal: Ability to redirect hostility and anger into socially appropriate behaviors will improve 08/07/2021 1917 by Perry Mount  R, RN Outcome: Progressing 08/07/2021 0921 by Sharen Hint, RN Outcome: Progressing Goal: Ability to remain free from injury will improve 08/07/2021 1917 by Sharen Hint, RN Outcome: Progressing 08/07/2021 0921 by Sharen Hint, RN Outcome: Progressing   Problem: Self-Care: Goal: Ability to participate in self-care as condition permits will improve 08/07/2021 1917 by Sharen Hint, RN Outcome: Progressing 08/07/2021 0921 by Sharen Hint, RN Outcome: Progressing   Problem: Self-Concept: Goal: Will verbalize positive feelings about self 08/07/2021 1917 by Sharen Hint, RN Outcome: Progressing 08/07/2021 0921 by Sharen Hint, RN Outcome: Progressing   Problem: Education: Goal: Utilization of techniques to improve thought processes will improve 08/07/2021 1917 by Sharen Hint, RN Outcome: Progressing 08/07/2021 0921 by Sharen Hint, RN Outcome: Progressing Goal: Knowledge of the prescribed therapeutic regimen will improve 08/07/2021 1917 by Sharen Hint, RN Outcome: Progressing 08/07/2021 0921 by Sharen Hint, RN Outcome: Progressing   Problem: Activity: Goal: Interest or engagement in leisure activities will improve 08/07/2021 1917 by Sharen Hint, RN Outcome: Progressing 08/07/2021 0921 by Sharen Hint, RN Outcome: Progressing Goal: Imbalance in normal sleep/wake cycle will improve 08/07/2021 1917 by Sharen Hint, RN Outcome: Progressing 08/07/2021 0921 by Sharen Hint, RN Outcome: Progressing   Problem: Coping: Goal: Coping ability will improve 08/07/2021 1917 by Sharen Hint, RN Outcome: Progressing 08/07/2021 0921 by Sharen Hint, RN Outcome: Progressing Goal: Will verbalize feelings 08/07/2021 1917 by Sharen Hint, RN Outcome:  Progressing 08/07/2021 0921 by Sharen Hint, RN Outcome: Progressing   Problem: Health Behavior/Discharge Planning: Goal: Ability to make decisions will improve 08/07/2021 1917 by Sharen Hint, RN Outcome: Progressing 08/07/2021 0921 by Sharen Hint, RN Outcome: Progressing Goal: Compliance with therapeutic regimen will improve 08/07/2021 1917 by Sharen Hint, RN Outcome: Progressing 08/07/2021 0921 by Sharen Hint, RN Outcome: Progressing   Problem: Role Relationship: Goal: Will demonstrate positive changes in social behaviors and relationships 08/07/2021 1917 by Sharen Hint, RN Outcome: Progressing 08/07/2021 0921 by Sharen Hint, RN Outcome: Progressing   Problem: Safety: Goal: Ability to disclose and discuss suicidal ideas will improve 08/07/2021 1917 by Sharen Hint, RN Outcome: Progressing 08/07/2021 0921 by Sharen Hint, RN Outcome: Progressing Goal: Ability to identify and utilize support systems that promote safety will improve 08/07/2021 1917 by Sharen Hint, RN Outcome: Progressing 08/07/2021 0921 by Sharen Hint, RN Outcome: Progressing   Problem: Self-Concept: Goal: Will verbalize positive feelings about self 08/07/2021 1917 by Sharen Hint, RN Outcome: Progressing 08/07/2021 0921 by Sharen Hint, RN Outcome: Progressing Goal: Level of anxiety will decrease 08/07/2021 1917 by Sharen Hint, RN Outcome: Progressing 08/07/2021 0921 by Sharen Hint, RN Outcome: Progressing

## 2021-08-07 NOTE — Progress Notes (Signed)
Patient reports the Lactulose was effective.

## 2021-08-07 NOTE — Progress Notes (Signed)
Patient alert, oriented and able to make needs known. Denies any current pain. Denies SI/HI, still having auditory hallucinations at times. Takes medications as ordered. Pt withdrawn at times and likes to isolate in his room. He did participate in groups today. Will continue to provide verbal encouragement and continue q32min checks.

## 2021-08-07 NOTE — Progress Notes (Signed)
Pt constipated and took Lactulose PRN, awaiting results.
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
## 2021-08-07 NOTE — [facility] Group Notes (Signed)
Adult Psychoeducational Group Note Date:  08/07/2021 Time:  0900-1045 Group Topic/Focus: PROGRESSIVE RELAXATION. A group where deep breathing is taught and tensing and relaxation muscle groups is used. Imagery is used as well.  Pts are asked to imagine 3 pillars that hold them up when they are not able to hold themselves up and to share that with the group.  Participation Level:  Active  Participation Quality:  Appropriate  Affect:  Appropriate  Cognitive:  Oriented  Insight: Improving  Engagement in Group:  Engaged  Modes of Intervention:  Activity, Discussion, Education, and Support  Additional Comments:  Rates energy at a 6/10. States his family and prayers holds him up.  Dione Housekeeper

## 2021-08-07 NOTE — Progress Notes (Signed)
Pt did not come to wrap up

## 2021-08-07 NOTE — Group Note (Signed)
[facility] LCSW Group Therapy Note  08/07/2021  10:00-11:00AM  Type of Therapy and Topic:  Group Therapy:  Adding Supports Including Myself  Participation Level:  Minimal   Description of Group:  Patients in this group were introduced to the differences between healthy supports and unheathy supports.  This led to a lengthy and emotional discussion about how to set boundaries, particularly with family members.  Many in group expressed that they put other people before themselves.  The group discussed that this not only hurts them, but ultimately ends up hurting the people they want to help.  Examples were given about how to set boundaries one at a time rather than merely cutting people off.  A song entitled "My Own Hero" was played.  A group discussion ensued in which patients stated they could relate to the song and it inspired them to realize they have be willing to help themselves in order to succeed, because other people cannot achieve sobriety or stability for them.  We discussed adding a variety of healthy supports to address the various needs in our lives.    Therapeutic Goals: 1)  demonstrate the importance of being a part of one's own support system by asking for and accepting help 2)  discuss reasons people in one's life may eventually be unable to be continually supportive  3)  identify the patient's current support system and   4)  elicit commitments to add healthy supports and to become more conscious of being self-supportive   Summary of Patient Progress:  The patient expressed his healthy support(s) right now include his mother while unhealthy supports include the streets.  He did not talk again in group.  The patient's overall reaction to this topic was unknown.  Therapeutic Modalities:   Motivational Interviewing Activity  Lynnell Chad

## 2021-08-08 ENCOUNTER — Encounter (HOSPITAL_COMMUNITY): Payer: Self-pay

## 2021-08-08 DIAGNOSIS — F251 Schizoaffective disorder, depressive type: Principal | ICD-10-CM

## 2021-08-08 MED ORDER — VALBENAZINE TOSYLATE 80 MG PO CAPS: 80.0000 mg | ORAL_CAPSULE | Freq: Every day | ORAL | 0 refills | Status: DC

## 2021-08-08 MED ORDER — QUETIAPINE FUMARATE ER 400 MG PO TB24
800.0000 mg | ORAL_TABLET | Freq: Every day | ORAL | 0 refills | Status: DC
Start: 2021-08-08 — End: 2022-04-19

## 2021-08-08 MED ORDER — FLUOXETINE HCL 20 MG PO CAPS
20.0000 mg | ORAL_CAPSULE | Freq: Every day | ORAL | 0 refills | Status: DC
Start: 2021-08-09 — End: 2022-05-15

## 2021-08-08 MED ORDER — PRAZOSIN HCL 5 MG PO CAPS
5.0000 mg | ORAL_CAPSULE | Freq: Every day | ORAL | 0 refills | Status: DC
Start: 2021-08-08 — End: 2022-04-19

## 2021-08-08 MED ORDER — MIRTAZAPINE 15 MG PO TABS
15.0000 mg | ORAL_TABLET | Freq: Every day | ORAL | 0 refills | Status: DC
Start: 2021-08-08 — End: 2022-05-15

## 2021-08-08 MED ORDER — TRAZODONE HCL 150 MG PO TABS
150.0000 mg | ORAL_TABLET | Freq: Every evening | ORAL | 0 refills | Status: DC | PRN
Start: 2021-08-08 — End: 2022-05-15

## 2021-08-08 NOTE — Progress Notes (Signed)
  [facility] Adult Case Management Discharge Plan :  Will you be returning to the same living situation after discharge:  Yes,  home At discharge, do you have transportation home?: Yes,  patient ACTT nurse, [place] Do you have the ability to pay for your medications: Yes,  insurance  Release of information consent forms completed and in the chart;  Patient's signature needed at discharge.  Patient to Follow up at:  Follow-up Information     [facility]. Call.   Why: Please follow up with your ACTT for ongoing mental health services.  Please follow treatment recommendations. Contact information: (ph) [phone] (f) [phone] [address], [place], [place] [address]                Next level of care provider has access to [facility] Link:no  Safety Planning and Suicide Prevention discussed: Yes,  nurse, [place]     Has patient been referred to the Quitline?: Patient refused referral  Patient has been referred for addiction treatment: N/A  Edwin Buehl E Rosa Wyly, LCSW 08/08/2021, 11:44 AM

## 2021-08-08 NOTE — Progress Notes (Signed)
D: Pt A & O X 4. Denies SI, HI, AVH and pain at this time. D/C home as ordered. Picked up in lobby by [place], ACTT team representative.  A: D/C instructions reviewed with pt including prescriptions and follow up appointment; compliance encouraged. All belongings from assigned locker given to pt at time of departure. Scheduled medications given with verbal education and effects monitored. Safety checks maintained without incident till time of d/c.  R: Pt receptive to care. Compliant with medications when offered. Denies adverse drug reactions when assessed. Verbalized understanding related to d/c instructions. Signed belonging sheet in agreement with items received from locker. Ambulatory with a steady gait. Appears to be in no physical distress at time of departure.

## 2021-08-08 NOTE — [facility] Group Notes (Signed)
Spiritual care group on grief and loss facilitated by chaplain Katy Aimee Heldman, [facility]   Group Goal:   Support / Education around grief and loss   Members engage in facilitated group support and psycho-social education.   Group Description:   Following introductions and group rules, group members engaged in facilitated group dialog and support around topic of loss, with particular support around experiences of loss in their lives. Group Identified types of loss (relationships / self / things) and identified patterns, circumstances, and changes that precipitate losses. Reflected on thoughts / feelings around loss, normalized grief responses, and recognized variety in grief experience. Group noted Worden's four tasks of grief in discussion.   Group drew on Adlerian / Rogerian, narrative, MI,   Patient Progress: Did not attend.  

## 2021-08-08 NOTE — [facility] Suicide Risk Assessment (Signed)
[facility] Discharge Suicide Risk Assessment   Principal Problem: Schizoaffective disorder, depressive type (HCC) Discharge Diagnoses: Principal Problem:   Schizoaffective disorder, depressive type (HCC) Active Problems:   Cocaine use disorder, mild, abuse (HCC)   Cannabis use disorder, mild, abuse   Total Time spent with patient: 45 minutes  Reason for admission: Edwin Henry is a 61 y.o., male with a past psychiatric history significant for schizophrenia who presents to the [facility] from [facility] after presented there on 6/26 with increased depression and SI.  PTA Medications: Prior to admission medication list indicates was on Prozac 20 mg at bedtime but patient notes he was on multiple medications but unable to name them  Hospital Course:   During the patient's hospitalization, patient had extensive initial psychiatric evaluation, and follow-up psychiatric evaluations every day.  Psychiatric diagnoses provided upon initial assessment: Schizoaffective disorder  Patient's psychiatric medications were adjusted on admission: At time of admission patient was restarted on Prozac 20 mg daily for depression, Remeron 15 mg daily at bedtime for depression and sleep, Minipress 5 mg at bedtime for nightmares, Seroquel 300 mg at bedtime for mood and psychosis, Ingrezza 80 mg at bedtime for history of TD, will continue same and monitor efficacy and safety and also obtain records from ACT team to address further medication management.             Trazodone 50 mg at bedtime as needed for sleep  During the hospitalization, other adjustments were made to the patient's psychiatric medication regimen: Continue Prozac 20 mg daily for depression, Remeron 15 mg daily at bedtime for depression and sleep, Minipress 5 mg at bedtime for nightmares Titrate Seroquel XR to 800 mg at bedtime for mood and psychosis Continue Ingrezza 80 mg at bedtime for history of TD monitor efficacy and safety and also  obtain records from ACT team to address further medication management.             Continue trazodone 150 mg at bedtime as needed for sleep  Patient's care was discussed during the interdisciplinary team meeting every day during the hospitalization.  The patient denies having side effects to prescribed psychiatric medication.  Gradually, patient started adjusting to milieu. The patient was evaluated each day by a clinical provider to ascertain response to treatment. Improvement was noted by the patient's report of decreasing symptoms, improved sleep and appetite, affect, medication tolerance, behavior, and participation in unit programming.  Patient was asked each day to complete a self inventory noting mood, mental status, pain, new symptoms, anxiety and concerns.    Symptoms were reported as significantly decreased or resolved completely by discharge.   On day of discharge, the patient reports that their mood is stable. The patient denied having suicidal thoughts for more than 48 hours prior to discharge.  Patient denies having homicidal thoughts.  Patient denies having auditory hallucinations.  Patient denies any visual hallucinations or other symptoms of psychosis. The patient was motivated to continue taking medication with a goal of continued improvement in mental health.   The patient reports their target psychiatric symptoms of responded well to the psychiatric medications, and the patient reports overall benefit other psychiatric hospitalization. Supportive psychotherapy was provided to the patient. The patient also participated in regular group therapy while hospitalized. Coping skills, problem solving as well as relaxation therapies were also part of the unit programming.  Labs were reviewed with the patient, and abnormal results were discussed with the patient.  The patient is able to verbalize their  individual safety plan to this provider.    Behavioral Events: none Restraints:  none  Groups:attended and participated  Medications Changes: as above  D/C Medications: as above   Musculoskeletal: Strength & Muscle Tone: within normal limits Gait & Station: normal Patient leans: N/A  Psychiatric Specialty Exam  General Appearance: Appears older than stated age, unkempt, dressed casually   Behavior: Cooperative, more interactive with interview with better reliability   Psychomotor Activity:                                       psychomotor retardation mild    Eye Contact: Much improved for the past 2 days, failure Speech: More fluent speech decreased amount, tone and volume       Mood: Mildly dysphoric, improving Affect: Smiling appropriately, much less restricted   Thought Process: linear, goal directed yet concrete Descriptions of Associations: intact Thought Content: Hallucinations: Denies AVH this morning, reports related hallucinations improved as noted above, does not appear responding to stimuli Delusions: Denies paranoia or other delusions  suicidal Thoughts: Denies SI, intention, plan in the hospital able to contract for safety in the house homicidal Thoughts: Denies HI, intention, plan    Alertness: Alert and fully oriented     Insight: Improved yet limited Judgment: Improved yet limited   Memory: Improved   Executive Functions  Concentration: Improved Attention Span: Improved Recall: Improved Fund of Knowledge: Improved    Assets  Assets:Communication Skills; Desire for Improvement; Leisure Time; Physical Health; Resilience   Sleep  Sleep:No data recorded  Physical Exam: Physical Exam ROS Blood pressure 110/78, pulse 94, temperature 98.2 F (36.8 C), resp. rate 18, height 6\' 2"  (1.88 m), weight 86.6 kg, SpO2 98 %. Body mass index is 24.52 kg/m.  Mental Status Per Nursing Assessment::   On Admission:  Self-harm thoughts  Demographic Factors:  Male  Loss Factors: NA  Historical Factors: Prior suicide  attempts  Risk Reduction Factors:   Positive social support  Continued Clinical Symptoms: improved since admission Depression:   Anhedonia Hopelessness  Cognitive Features That Contribute To Risk:  None    Suicide Risk:  Minimal: No identifiable suicidal ideation.  Patients presenting with no risk factors but with morbid ruminations; may be classified as minimal risk based on the severity of the depressive symptoms   Follow-up Information     [facility]. Call.   Why: Please follow up with your ACTT for ongoing mental health services.  Please follow treatment recommendations. Contact information: (ph) [phone] (f) [phone] [address], [place], [place]                Plan Of Care/Follow-up recommendations:   Discharge recommendations:    # It is recommended to the patient to continue psychiatric medications as prescribed, after discharge from the hospital.    # It is recommended to the patient to follow up with your outpatient psychiatric provider and PCP.  # It was discussed with the patient, the impact of alcohol, drugs, tobacco have been there overall psychiatric and medical wellbeing, and total abstinence from substance use was recommended the patient.ed.  # Prescriptions provided or sent directly to preferred pharmacy at discharge. Patient agreeable to plan. Given opportunity to ask questions. Appears to feel comfortable with discharge.    # In the event of worsening symptoms, the patient is instructed to call the crisis hotline, 911 and or  go to the nearest ED for appropriate evaluation and treatment of symptoms. To follow-up with primary care provider for other medical issues, concerns and or health care needs  # Patient was discharged home with ACT team staff with a plan to follow up as noted above.   Patient agrees with D/C instructions and plan.  The patient received suicide prevention pamphlet:  Yes Belongings returned:   Clothing and Valuables  Total Time Spent in Direct Patient Care:  I personally spent 45 minutes on the unit in direct patient care. The direct patient care time included face-to-face time with the patient, reviewing the patient's chart, communicating with other professionals, and coordinating care. Greater than 50% of this time was spent in counseling or coordinating care with the patient regarding goals of hospitalization, psycho-education, and discharge planning needs.   Edwin Henry 08/08/2021, 11:56 AM   Kayin Osment Abbott Pao, MD 08/08/2021, 11:56 AM

## 2021-08-08 NOTE — Group Note (Signed)
LCSW Group Therapy Note  Group Date: 08/08/2021 Start Time: 1300 End Time: 1400   Type of Therapy and Topic:  Group Therapy - Healthy vs Unhealthy Coping Skills  Participation Level:  Did Not Attend   Description of Group The focus of this group was to determine what unhealthy coping techniques typically are used by group members and what healthy coping techniques would be helpful in coping with various problems. Patients were guided in becoming aware of the differences between healthy and unhealthy coping techniques. Patients were asked to identify 2-3 healthy coping skills they would like to learn to use more effectively.  Therapeutic Goals Patients learned that coping is what human beings do all day long to deal with various situations in their lives Patients defined and discussed healthy vs unhealthy coping techniques Patients identified their preferred coping techniques and identified whether these were healthy or unhealthy Patients determined 2-3 healthy coping skills they would like to become more familiar with and use more often. Patients provided support and ideas to each other   Summary of Patient Progress:  Did not attend    Therapeutic Modalities Cognitive Behavioral Therapy Motivational Interviewing  Aram Beecham, [place] 08/08/2021  2:04 PM

## 2021-08-08 NOTE — BH IP Treatment Plan (Signed)
Interdisciplinary Treatment and Diagnostic Plan Update  08/08/2021 Time of Session: 0830 Edwin Henry MRN: [id number]  Principal Diagnosis: Schizoaffective disorder, depressive type [facility])  Secondary Diagnoses: Principal Problem:   Schizoaffective disorder, depressive type (HCC) Active Problems:   Cocaine use disorder, mild, abuse (HCC)   Cannabis use disorder, mild, abuse   Current Medications:  Current Facility-Administered Medications  Medication Dose Route Frequency Provider Last Rate Last Admin   acetaminophen (TYLENOL) tablet 650 mg  650 mg Oral Q6H PRN Onuoha, Chinwendu V, NP   650 mg at 08/06/21 0757   alum & mag hydroxide-simeth (MAALOX/MYLANTA) 200-200-20 MG/5ML suspension 30 mL  30 mL Oral Q4H PRN Onuoha, Chinwendu V, NP       feeding supplement (ENSURE ENLIVE / ENSURE PLUS) liquid 237 mL  237 mL Oral BID BM Lauro Franklin, MD   237 mL at 08/08/21 0813   FLUoxetine (PROZAC) capsule 20 mg  20 mg Oral Daily Onuoha, Chinwendu V, NP   20 mg at 08/08/21 7106   magnesium hydroxide (MILK OF MAGNESIA) suspension 30 mL  30 mL Oral Daily PRN Onuoha, Chinwendu V, NP   30 mL at 08/07/21 1241   methocarbamol (ROBAXIN) tablet 750 mg  750 mg Oral TID Onuoha, Chinwendu V, NP   750 mg at 08/08/21 0812   mirtazapine (REMERON) tablet 15 mg  15 mg Oral QHS Onuoha, Chinwendu V, NP   15 mg at 08/07/21 2107   prazosin (MINIPRESS) capsule 5 mg  5 mg Oral QHS Onuoha, Chinwendu V, NP   5 mg at 08/07/21 2107   QUEtiapine (SEROQUEL XR) 24 hr tablet 800 mg  800 mg Oral QHS Attiah, Nadir, MD   800 mg at 08/07/21 2107   traZODone (DESYREL) tablet 150 mg  150 mg Oral QHS PRN Sarita Bottom, MD       valbenazine (INGREZZA) capsule 80 mg  80 mg Oral QHS Onuoha, Chinwendu V, NP   80 mg at 08/07/21 2107   PTA Medications: Medications Prior to Admission  Medication Sig Dispense Refill Last Dose   FLUoxetine (PROZAC) 20 MG capsule Take 20 mg by mouth at bedtime.       Patient Stressors: Marital or  family conflict   Medication change or noncompliance   Substance abuse    Patient Strengths: Capable of independent living  General fund of knowledge  Motivation for treatment/growth  Supportive family/friends   Treatment Modalities: Medication Management, Group therapy, Case management,  1 to 1 session with clinician, Psychoeducation, Recreational therapy.   Physician Treatment Plan for Primary Diagnosis: Schizoaffective disorder, depressive type (HCC) Long Term Goal(s): Improvement in symptoms so as ready for discharge   Short Term Goals: Ability to identify changes in lifestyle to reduce recurrence of condition will improve Ability to verbalize feelings will improve Ability to disclose and discuss suicidal ideas Ability to demonstrate self-control will improve  Medication Management: Evaluate patient's response, side effects, and tolerance of medication regimen.  Therapeutic Interventions: 1 to 1 sessions, Unit Group sessions and Medication administration.  Evaluation of Outcomes: Progressing  Physician Treatment Plan for Secondary Diagnosis: Principal Problem:   Schizoaffective disorder, depressive type (HCC) Active Problems:   Cocaine use disorder, mild, abuse (HCC)   Cannabis use disorder, mild, abuse  Long Term Goal(s): Improvement in symptoms so as ready for discharge   Short Term Goals: Ability to identify changes in lifestyle to reduce recurrence of condition will improve Ability to verbalize feelings will improve Ability to disclose and discuss suicidal ideas Ability to  demonstrate self-control will improve     Medication Management: Evaluate patient's response, side effects, and tolerance of medication regimen.  Therapeutic Interventions: 1 to 1 sessions, Unit Group sessions and Medication administration.  Evaluation of Outcomes: Progressing   RN Treatment Plan for Primary Diagnosis: Schizoaffective disorder, depressive type (HCC) Long Term Goal(s):  Knowledge of disease and therapeutic regimen to maintain health will improve  Short Term Goals: Ability to remain free from injury will improve, Ability to verbalize frustration and anger appropriately will improve, Ability to demonstrate self-control, Ability to participate in decision making will improve, Ability to verbalize feelings will improve, Ability to disclose and discuss suicidal ideas, Ability to identify and develop effective coping behaviors will improve, and Compliance with prescribed medications will improve  Medication Management: RN will administer medications as ordered by provider, will assess and evaluate patient's response and provide education to patient for prescribed medication. RN will report any adverse and/or side effects to prescribing provider.  Therapeutic Interventions: 1 on 1 counseling sessions, Psychoeducation, Medication administration, Evaluate responses to treatment, Monitor vital signs and CBGs as ordered, Perform/monitor CIWA, COWS, AIMS and Fall Risk screenings as ordered, Perform wound care treatments as ordered.  Evaluation of Outcomes: Progressing   LCSW Treatment Plan for Primary Diagnosis: Schizoaffective disorder, depressive type (HCC) Long Term Goal(s): Safe transition to appropriate next level of care at discharge, Engage patient in therapeutic group addressing interpersonal concerns.  Short Term Goals: Engage patient in aftercare planning with referrals and resources, Increase social support, Increase ability to appropriately verbalize feelings, Increase emotional regulation, Facilitate acceptance of mental health diagnosis and concerns, Facilitate patient progression through stages of change regarding substance use diagnoses and concerns, Identify triggers associated with mental health/substance abuse issues, and Increase skills for wellness and recovery  Therapeutic Interventions: Assess for all discharge needs, 1 to 1 time with Social worker,  Explore available resources and support systems, Assess for adequacy in community support network, Educate family and significant other(s) on suicide prevention, Complete Psychosocial Assessment, Interpersonal group therapy.  Evaluation of Outcomes: Progressing   Progress in Treatment: Attending groups: Yes. Participating in groups: No. Taking medication as prescribed: Yes. Toleration medication: Yes. Family/Significant other contact made: No, will contact:  CSW has contacted [place] w/ ACTT services.  Patient understands diagnosis: Yes. Discussing patient identified problems/goals with staff: Yes. Medical problems stabilized or resolved: Yes. Denies suicidal/homicidal ideation: No. Issues/concerns per patient self-inventory: Yes. Other: none  New problem(s) identified: No, Describe:  none  New Short Term/Long Term Goal(s): Patient to work towards detox, medication management for mood stabilization; elimination of SI thoughts; development of comprehensive mental wellness/sobriety plan.  Patient Goals: No additional goals identified at this time. Patient to continue to work towards original goals identified in initial treatment team meeting. CSW will remain available to patient should they voice additional treatment goals.   Discharge Plan or Barriers: No psychosocial barriers identified at this time, patient to return to place of residence when appropriate for discharge.   Reason for Continuation of Hospitalization: Depression  Estimated Length of Stay: 1-7 days   Last 3 [place] Suicide Severity Risk Score: Flowsheet Row [facility]/2021 in [facility] (Dis[facility]  C-SSRS RISK CATEGORY Low Risk Low Risk Moderate Risk        Scribe for Treatment Team: Almedia Balls 08/08/2021 9:02  AM

## 2021-08-08 NOTE — Discharge Summary (Signed)
Physician Discharge Summary Note  Patient:  Edwin Henry is an 61 y.o., male MRN:  [id number] DOB:  Oct 15, 1960 Patient phone:  [phone] (home)  Patient address:   [address] [place] [address],  Total Time spent with patient: 45 minutes  Date of Admission:  08/02/2021 Date of Discharge: 08/08/2021  Reason for Admission:  [name] is a 61 y.o., male with a past psychiatric history significant for schizophrenia who presents to the [facility] from [facility] after presented there on 6/26 with increased depression and SI.  Principal Problem: Schizoaffective disorder, depressive type [facility]) Discharge Diagnoses: Principal Problem:   Schizoaffective disorder, depressive type (HCC) Active Problems:   Cocaine use disorder, mild, abuse (HCC)   Cannabis use disorder, mild, abuse   Past Psychiatric History: Prior Psychiatric diagnoses: Schizoaffective disorder Past Psychiatric Hospitalizations: At least 3 times last time 2020   History of self mutilation: Denies Past suicide attempts: Reports at least 5 previous suicide attempts last 6 months ago but unable to specify details Past history of HI, violent or aggressive behavior: Denies   Past Psychiatric medications trials: Unable to recall names of medications given previously or currently History of ECT/TMS: Denies   Outpatient psychiatric Follow up: Follows up with [facility] in [place]/ACT team Prior Outpatient Therapy: As above  Past Medical History:  Past Medical History:  Diagnosis Date   Hypertension    Insomnia disorder    Schizophrenia (HCC)     Past Surgical History:  Procedure Laterality Date   KNEE SURGERY Right    Family History: History reviewed. No pertinent family history. Family Psychiatric  History: Psychiatric illness: Denies Suicide: Denies Substance Abuse: Denies  Social History:  Social History   Substance and Sexual Activity  Alcohol Use Yes   Comment: 1-2  beers every 2 days     Social History   Substance and Sexual Activity  Drug Use Yes   Types: Marijuana, Cocaine   Comment: occasional THC use, 12/15/20 not now    Social History   Socioeconomic History   Marital status: Single    Spouse name: Not on file   Number of children: 1   Years of education: Not on file   Highest education level: 9th grade  Occupational History   Not on file  Tobacco Use   Smoking status: Former    Types: Cigars, Cigarettes    Quit date: 11/07/2019    Years since quitting: 1.7   Smokeless tobacco: Current    Types: Chew  Vaping Use   Vaping Use: Never used  Substance and Sexual Activity   Alcohol use: Yes    Comment: 1-2 beers every 2 days   Drug use: Yes    Types: Marijuana, Cocaine    Comment: occasional THC use, 12/15/20 not now   Sexual activity: Yes    Birth control/protection: Condom  Other Topics Concern   Not on file  Social History Narrative   Completed 9th grade special ed   12/13/20 lives alone in apt   Caffeine- sodas 2 a day, occas energy drink    Social Determinants of Health   Financial Resource Strain: Not on file  Food Insecurity: Not on file  Transportation Needs: Not on file  Physical Activity: Not on file  Stress: Not on file  Social Connections: Not on file    Hospital Course:  During the patient's hospitalization, patient had extensive initial psychiatric evaluation, and follow-up psychiatric evaluations every day.   Psychiatric diagnoses provided upon  initial assessment: Schizoaffective disorder   Patient's psychiatric medications were adjusted on admission: At time of admission patient was restarted on Prozac 20 mg daily for depression, Remeron 15 mg daily at bedtime for depression and sleep, Minipress 5 mg at bedtime for nightmares, Seroquel 300 mg at bedtime for mood and psychosis, Ingrezza 80 mg at bedtime for history of TD, will continue same and monitor efficacy and safety and also obtain records from ACT team  to address further medication management.             Trazodone 50 mg at bedtime as needed for sleep   During the hospitalization, other adjustments were made to the patient's psychiatric medication regimen: Continue Prozac 20 mg daily for depression, Remeron 15 mg daily at bedtime for depression and sleep, Minipress 5 mg at bedtime for nightmares Titrate Seroquel XR to 800 mg at bedtime for mood and psychosis Continue Ingrezza 80 mg at bedtime for history of TD monitor efficacy and safety and also obtain records from ACT team to address further medication management.             Continue trazodone 150 mg at bedtime as needed for sleep   Patient's care was discussed during the interdisciplinary team meeting every day during the hospitalization.   The patient denies having side effects to prescribed psychiatric medication.   Gradually, patient started adjusting to milieu. The patient was evaluated each day by a clinical provider to ascertain response to treatment. Improvement was noted by the patient's report of decreasing symptoms, improved sleep and appetite, affect, medication tolerance, behavior, and participation in unit programming.  Patient was asked each day to complete a self inventory noting mood, mental status, pain, new symptoms, anxiety and concerns.     Symptoms were reported as significantly decreased or resolved completely by discharge.    On day of discharge, the patient reports that their mood is stable. The patient denied having suicidal thoughts for more than 48 hours prior to discharge.  Patient denies having homicidal thoughts.  Patient denies having auditory hallucinations.  Patient denies any visual hallucinations or other symptoms of psychosis. The patient was motivated to continue taking medication with a goal of continued improvement in mental health.    The patient reports their target psychiatric symptoms of responded well to the psychiatric medications, and the patient  reports overall benefit other psychiatric hospitalization. Supportive psychotherapy was provided to the patient. The patient also participated in regular group therapy while hospitalized. Coping skills, problem solving as well as relaxation therapies were also part of the unit programming.   Labs were reviewed with the patient, and abnormal results were discussed with the patient.   The patient is able to verbalize their individual safety plan to this provider.   Behavioral Events: none Restraints: none   Groups:attended and participated   Medications Changes: as above   D/C Medications: as above  Physical Findings: AIMS: Facial and Oral Movements Muscles of Facial Expression: None, normal Lips and Perioral Area: None, normal Jaw: None, normal Tongue: None, normal,Extremity Movements Upper (arms, wrists, hands, fingers): None, normal Lower (legs, knees, ankles, toes): None, normal, Trunk Movements Neck, shoulders, hips: None, normal, Overall Severity Severity of abnormal movements (highest score from questions above): None, normal Incapacitation due to abnormal movements: None, normal Patient's awareness of abnormal movements (rate only patient's report): No Awareness, Dental Status Current problems with teeth and/or dentures?: No Does patient usually wear dentures?: No  CIWA:    COWS:  Musculoskeletal: Strength & Muscle Tone: within normal limits Gait & Station: normal Patient leans: N/A   Psychiatric Specialty Exam:  General Appearance: Appears older than stated age, unkempt, dressed casually   Behavior: Cooperative, more interactive with interview with better reliability   Psychomotor Activity:                                       psychomotor retardation mild    Eye Contact: Much improved for the past 2 days, failure Speech: More fluent speech decreased amount, tone and volume   Mood: Mildly dysphoric, improving Affect: Smiling appropriately, much less  restricted   Thought Process: linear, goal directed yet concrete Descriptions of Associations: intact Thought Content: Hallucinations: Denies AVH this morning, reports related hallucinations improved as noted above, does not appear responding to stimuli Delusions: Denies paranoia or other delusions  suicidal Thoughts: Denies SI, intention, plan in the hospital able to contract for safety in the house homicidal Thoughts: Denies HI, intention, plan    Alertness: Alert and fully oriented     Insight: Improved yet limited Judgment: Improved yet limited   Memory: Improved   Executive Functions  Concentration: Improved Attention Span: Improved Recall: Improved Fund of Knowledge: Improved  Assets  Assets:Communication Skills; Desire for Improvement; Leisure Time; Physical Health; Resilience   Sleep  Sleep:No data recorded   Physical Exam:  Physical Exam Constitutional:      Appearance: Normal appearance.  HENT:     Head: Normocephalic and atraumatic.  Eyes:     Pupils: Pupils are equal, round, and reactive to light.  Pulmonary:     Effort: Pulmonary effort is normal.  Musculoskeletal:        General: Normal range of motion.     Cervical back: Normal range of motion.  Neurological:     General: No focal deficit present.     Mental Status: He is alert and oriented to person, place, and time.    Review of Systems  Constitutional: Negative.   Respiratory: Negative.    Cardiovascular: Negative.   Gastrointestinal: Negative.   Genitourinary: Negative.   Skin: Negative.   Neurological: Negative.    Blood pressure 110/78, pulse 94, temperature 98.2 F (36.8 C), resp. rate 18, height 6\' 2"  (1.88 m), weight 86.6 kg, SpO2 98 %. Body mass index is 24.52 kg/m.   Social History   Tobacco Use  Smoking Status Former   Types: Cigars, Cigarettes   Quit date: 11/07/2019   Years since quitting: 1.7  Smokeless Tobacco Current   Types: Chew   Tobacco Cessation:  N/A,  patient does not currently use tobacco products   Blood Alcohol level:  Lab Results  Component Value Date   ETH 100 (H) 10/01/2018   ETH <10 08/13/2018    Metabolic Disorder Labs:  Lab Results  Component Value Date   HGBA1C 5.8 (H) 08/01/2021   MPG 120 08/01/2021   MPG 108.28 08/13/2018   Lab Results  Component Value Date   PROLACTIN 0.7 (L) 08/01/2021   PROLACTIN 22.1 (H) 10/11/2017   Lab Results  Component Value Date   CHOL 238 (H) 08/01/2021   TRIG 498 (H) 08/01/2021   HDL 35 (L) 08/01/2021   CHOLHDL 6.8 08/01/2021   VLDL UNABLE TO CALCULATE IF TRIGLYCERIDE OVER 400 mg/dL 08/03/2021   LDLCALC UNABLE TO CALCULATE IF TRIGLYCERIDE OVER 400 mg/dL 17/00/1749   LDLCALC 44/96/7591 (H) 08/13/2018  See Psychiatric Specialty Exam and Suicide Risk Assessment completed by Attending Physician prior to discharge.  Discharge destination:  Home  Is patient on multiple antipsychotic therapies at discharge:  No   Has Patient had three or more failed trials of antipsychotic monotherapy by history:  No  Recommended Plan for Multiple Antipsychotic Therapies: NA  Discharge Instructions     Diet - low sodium heart healthy   Complete by: As directed    Increase activity slowly   Complete by: As directed       Allergies as of 08/08/2021   No Known Allergies      Medication List     TAKE these medications      Indication  FLUoxetine 20 MG capsule Commonly known as: PROZAC Take 1 capsule (20 mg total) by mouth daily. Start taking on: August 09, 2021 What changed: when to take this  Indication: Major Depressive Disorder   mirtazapine 15 MG tablet Commonly known as: REMERON Take 1 tablet (15 mg total) by mouth at bedtime.  Indication: Major Depressive Disorder   prazosin 5 MG capsule Commonly known as: MINIPRESS Take 1 capsule (5 mg total) by mouth at bedtime.  Indication: Frightening Dreams, Disturbed Sleep   QUEtiapine 400 MG 24 hr tablet Commonly known as: SEROQUEL  XR Take 2 tablets (800 mg total) by mouth at bedtime.  Indication: Major Depressive Disorder, Schizophrenia   traZODone 150 MG tablet Commonly known as: DESYREL Take 1 tablet (150 mg total) by mouth at bedtime as needed for sleep.  Indication: Trouble Sleeping   valbenazine 80 MG capsule Commonly known as: INGREZZA Take 1 capsule (80 mg total) by mouth at bedtime.  Indication: Tardive Dyskinesia        Follow-up Information     [facility]. Call.   Why: Please follow up with your ACTT for ongoing mental health services.  Please follow treatment recommendations. Contact information: (ph) [phone] (f) [phone] [address], [place], [place] [address]                Discharge recommendations:    # It is recommended to the patient to continue psychiatric medications as prescribed, after discharge from the hospital.     # It is recommended to the patient to follow up with your outpatient psychiatric provider and PCP.   # It was discussed with the patient, the impact of alcohol, drugs, tobacco have been there overall psychiatric and medical wellbeing, and total abstinence from substance use was recommended the patient.ed.   # Prescriptions provided or sent directly to preferred pharmacy at discharge. Patient agreeable to plan. Given opportunity to ask questions. Appears to feel comfortable with discharge.    # In the event of worsening symptoms, the patient is instructed to call the crisis hotline, 911 and or go to the nearest ED for appropriate evaluation and treatment of symptoms. To follow-up with primary care provider for other medical issues, concerns and or health care needs   # Patient was discharged home with a plan to follow up as noted above.   Patient agrees with D/C instructions and plan.   The patient received suicide prevention pamphlet:  Yes Belongings returned:  Clothing and Valuables  Total Time Spent in Direct Patient Care:  I  personally spent 45 minutes on the unit in direct patient care. The direct patient care time included face-to-face time with the patient, reviewing the patient's chart, communicating with other professionals, and coordinating care. Greater than 50% of this time  was spent in counseling or coordinating care with the patient regarding goals of hospitalization, psycho-education, and discharge planning needs.    SignedSarita Bottom: Maxwell Lemen, MD 08/08/2021, 12:53 PM

## 2022-04-18 ENCOUNTER — Ambulatory Visit (HOSPITAL_COMMUNITY)
Admission: EM | Admit: 2022-04-18 | Discharge: 2022-04-19 | Disposition: A | Discharge status: Home / Self Care | Payer: Medicaid Other | Attending: Registered Nurse | Admitting: Registered Nurse

## 2022-04-18 ENCOUNTER — Encounter (HOSPITAL_COMMUNITY): Payer: Self-pay | Admitting: Registered Nurse

## 2022-04-18 DIAGNOSIS — Z1152 Encounter for screening for COVID-19: Secondary | ICD-10-CM | POA: Insufficient documentation

## 2022-04-18 DIAGNOSIS — F251 Schizoaffective disorder, depressive type: Secondary | ICD-10-CM | POA: Insufficient documentation

## 2022-04-18 DIAGNOSIS — R45851 Suicidal ideations: Secondary | ICD-10-CM | POA: Insufficient documentation

## 2022-04-18 LAB — CBC WITH DIFFERENTIAL/PLATELET
Abs Immature Granulocytes: 0.02 10*3/uL (ref 0.00–0.07)
Basophils Absolute: 0.1 10*3/uL (ref 0.0–0.1)
Basophils Relative: 1 %
Eosinophils Absolute: 0.1 10*3/uL (ref 0.0–0.5)
Eosinophils Relative: 2 %
HCT: 48.4 % (ref 39.0–52.0)
Hemoglobin: 16.9 g/dL (ref 13.0–17.0)
Immature Granulocytes: 0 %
Lymphocytes Relative: 37 %
Lymphs Abs: 2.7 10*3/uL (ref 0.7–4.0)
MCH: 30.8 pg (ref 26.0–34.0)
MCHC: 34.9 g/dL (ref 30.0–36.0)
MCV: 88.3 fL (ref 80.0–100.0)
Monocytes Absolute: 0.9 10*3/uL (ref 0.1–1.0)
Monocytes Relative: 12 %
Neutro Abs: 3.5 10*3/uL (ref 1.7–7.7)
Neutrophils Relative %: 48 %
Platelets: 312 10*3/uL (ref 150–400)
RBC: 5.48 MIL/uL (ref 4.22–5.81)
RDW: 13.7 % (ref 11.5–15.5)
WBC: 7.3 10*3/uL (ref 4.0–10.5)
nRBC: 0 % (ref 0.0–0.2)

## 2022-04-18 LAB — COMPREHENSIVE METABOLIC PANEL
ALT: 20 U/L (ref 0–44)
AST: 21 U/L (ref 15–41)
Albumin: 4.2 g/dL (ref 3.5–5.0)
Alkaline Phosphatase: 111 U/L (ref 38–126)
Anion gap: 15 (ref 5–15)
BUN: 13 mg/dL (ref 8–23)
CO2: 26 mmol/L (ref 22–32)
Calcium: 9.9 mg/dL (ref 8.9–10.3)
Chloride: 96 mmol/L — ABNORMAL LOW (ref 98–111)
Creatinine, Ser: 0.83 mg/dL (ref 0.61–1.24)
GFR, Estimated: >60 mL/min (ref >60)
Glucose, Bld: 79 mg/dL (ref 70–99)
Potassium: 4.3 mmol/L (ref 3.5–5.1)
Sodium: 137 mmol/L (ref 135–145)
Total Bilirubin: 0.6 mg/dL (ref 0.3–1.2)
Total Protein: 8.2 g/dL — ABNORMAL HIGH (ref 6.5–8.1)

## 2022-04-18 LAB — POCT URINE DRUG SCREEN - MANUAL ENTRY (I-SCREEN)
POC Amphetamine UR: NOT DETECTED
POC Buprenorphine (BUP): NOT DETECTED
POC Cocaine UR: NOT DETECTED
POC Marijuana UR: NOT DETECTED
POC Methadone UR: NOT DETECTED
POC Methamphetamine UR: NOT DETECTED
POC Morphine: NOT DETECTED
POC Oxazepam (BZO): NOT DETECTED
POC Oxycodone UR: NOT DETECTED
POC Secobarbital (BAR): NOT DETECTED

## 2022-04-18 LAB — URINALYSIS, ROUTINE W REFLEX MICROSCOPIC
Bacteria, UA: NONE SEEN
Bilirubin Urine: NEGATIVE
Glucose, UA: NEGATIVE mg/dL
Ketones, ur: NEGATIVE mg/dL
Leukocytes,Ua: NEGATIVE
Nitrite: NEGATIVE
Protein, ur: NEGATIVE mg/dL
Specific Gravity, Urine: 1.01 (ref 1.005–1.030)
pH: 5 (ref 5.0–8.0)

## 2022-04-18 LAB — LIPID PANEL
Cholesterol: 217 mg/dL — ABNORMAL HIGH (ref 0–200)
HDL: 42 mg/dL (ref >40)
LDL Cholesterol: 131 mg/dL — ABNORMAL HIGH (ref 0–99)
Total CHOL/HDL Ratio: 5.2 RATIO
Triglycerides: 221 mg/dL — ABNORMAL HIGH (ref <150)
VLDL: 44 mg/dL — ABNORMAL HIGH (ref 0–40)

## 2022-04-18 LAB — MAGNESIUM: Magnesium: 2.3 mg/dL (ref 1.7–2.4)

## 2022-04-18 LAB — TSH: TSH: 1.315 u[IU]/mL (ref 0.350–4.500)

## 2022-04-18 LAB — RESP PANEL BY RT-PCR (RSV, FLU A&B, COVID)  RVPGX2
Influenza A by PCR: NEGATIVE
Influenza B by PCR: NEGATIVE
Resp Syncytial Virus by PCR: NEGATIVE
SARS Coronavirus 2 by RT PCR: NEGATIVE

## 2022-04-18 LAB — ETHANOL: Alcohol, Ethyl (B): 86 mg/dL — ABNORMAL HIGH (ref <10)

## 2022-04-18 MED ORDER — QUETIAPINE FUMARATE ER 300 MG PO TB24
800.0000 mg | ORAL_TABLET | Freq: Every day | ORAL | Status: DC
  Administered 2022-04-18: 800 mg via ORAL
  Filled 2022-04-18: qty 1

## 2022-04-18 MED ORDER — MIRTAZAPINE 15 MG PO TABS
15.0000 mg | ORAL_TABLET | Freq: Every day | ORAL | Status: DC
  Administered 2022-04-18: 15 mg via ORAL
  Filled 2022-04-18: qty 1

## 2022-04-18 MED ORDER — MAGNESIUM HYDROXIDE 400 MG/5ML PO SUSP: 30.0000 mL | Freq: Every day | ORAL | Status: DC | PRN

## 2022-04-18 MED ORDER — ACETAMINOPHEN 325 MG PO TABS: 650.0000 mg | ORAL_TABLET | Freq: Four times a day (QID) | ORAL | Status: DC | PRN

## 2022-04-18 MED ORDER — ALUM & MAG HYDROXIDE-SIMETH 200-200-20 MG/5ML PO SUSP: 30.0000 mL | ORAL | Status: DC | PRN

## 2022-04-18 MED ORDER — VALBENAZINE TOSYLATE 40 MG PO CAPS
80.0000 mg | ORAL_CAPSULE | Freq: Every day | ORAL | Status: DC
  Administered 2022-04-18: 80 mg via ORAL
  Filled 2022-04-18: qty 2

## 2022-04-18 MED ORDER — FLUOXETINE HCL 20 MG PO CAPS
20.0000 mg | ORAL_CAPSULE | Freq: Every day | ORAL | Status: DC
  Administered 2022-04-19: 20 mg via ORAL
  Filled 2022-04-18 (×2): qty 1

## 2022-04-18 MED ORDER — TRAZODONE HCL 150 MG PO TABS: 150.0000 mg | ORAL_TABLET | Freq: Every evening | ORAL | Status: DC | PRN

## 2022-04-18 MED ORDER — PRAZOSIN HCL 2 MG PO CAPS
5.0000 mg | ORAL_CAPSULE | Freq: Every day | ORAL | Status: DC
  Administered 2022-04-18: 5 mg via ORAL
  Filled 2022-04-18: qty 1

## 2022-04-18 NOTE — ED Provider Notes (Signed)
[facility] Continuous Assessment Admission H&P  Date: 04/18/22 Patient Name: Edwin Henry MRN: [id number] Chief Complaint: Insomnia, auditory hallucinations, suicidal ideation  Diagnoses:  Final diagnoses:  Schizoaffective disorder, depressive type [facility])    HPI: Edwin Henry 62 yr old male with history of schizoaffective disorder present to [facility] as walk in accompanied by his [place] ACTT team case worker (Mali) with complaints of suicidal ideation and plan to walk into traffic.    Patient seen face to face by this provider, consulted with Dr. Hampton Abbot; and chart reviewed on 04/18/22.  On evaluation Edwin Henry reports he hasn't been feeling well and that he feels that his medications are not working.  State he has been having suicidal thoughts with planned to walk into traffic. Reports he has been hearing voices telling him to "just do it." Patient also reports he has been seeing shadow out of the corner of his eye. Reports for the last couple of days he hasn't been feeling well with some nausea. Patient also states that he has had a decrease in appetite and has lost 30 lbs in the last month. Patient reports that he is feeling "just beat down and I don't feel like going on." Patient also states that he hasn't been able to sleep. Reports he is prescribed medications by [facility] to help with sleep but they are not working period reports he has even doubled his doses to see if it would help and is not helping. Patient reporting he has services with [facility] and visit his home once a week. Reports he has been compliant with his medications. Patient states he feels that if he is able to get some sleep he may feel a little better. During evaluation Latron Binda is sitting upright in chair with no noted distress.  He is alert/oriented x 4, calm, cooperative, and attentive.  His responses were appropriate to assessment questions.  His mood is anxious and depressed with  congruent affect.  He spoke in a clear tone at moderate volume, and normal pace, with good eye contact.   He denies homicidal ideation and paranoia.  But continues to endorse auditory/visual hallucinations that worsen at nighttime.  Patient also endorsing insomnia. Objectively:  there is no evidence of psychosis/mania or delusional thinking other than patient's endorsement that he is hearing voices and seeing shadows.  He conversed coherently, with goal directed thoughts, and no distractibility, or pre-occupation.  Patient is unable to contract for safety.  Reports he has no support system other than his ACTT case worker (Mali).  "All of my family lives in [place]."  Recommending inpatient psychiatric treatment.  Total Time spent with patient: 30 minutes  Musculoskeletal  Strength & Muscle Tone: within normal limits Gait & Station: normal Patient leans: N/A  Psychiatric Specialty Exam  Presentation General Appearance:  Appropriate for Environment  Eye Contact: Good  Speech: Clear and Coherent  Speech Volume: Normal  Handedness: Right   Mood and Affect  Mood: Anxious; Depressed  Affect: Congruent   Thought Process  Thought Processes: Coherent; Goal Directed  Descriptions of Associations:Intact  Orientation:Full (Time, Place and Person)  Thought Content:Logical  Diagnosis of Schizophrenia or Schizoaffective disorder in past: Yes  Duration of Psychotic Symptoms: Less than six months  Hallucinations:Hallucinations: Auditory Description of Auditory Hallucinations: Reports he is hearing voices telling "why don't you just go on and end it"  Ideas of Reference:None  Suicidal Thoughts:Suicidal Thoughts: Yes, Passive SI Passive Intent and/or Plan: Without Intent; With Plan  Homicidal Thoughts:Homicidal Thoughts: No   Sensorium  Memory: Immediate Good; Recent Good  Judgment: Fair  Insight: Fair; Present   Executive Functions   Concentration: Good  Attention Span: Good  Recall: Good  Fund of Knowledge: Good  Language: Good   Psychomotor Activity  Psychomotor Activity: Psychomotor Activity: Normal   Assets  Assets: Communication Skills; Desire for Improvement; Housing; Leisure Time; Resilience   Sleep  Sleep: Sleep: Poor   Nutritional Assessment (For OBS and FBC admissions only) Has the patient had a weight loss or gain of 10 pounds or more in the last 3 months?: Yes (States he has lost 3o pound in last month) Has the patient had a decrease in food intake/or appetite?: Yes Does the patient have dental problems?: No Does the patient have eating habits or behaviors that may be indicators of an eating disorder including binging or inducing vomiting?: No Has the patient recently lost weight without trying?: 3 Has the patient been eating poorly because of a decreased appetite?: 1 Malnutrition Screening Tool Score: 4 Nutritional Assessment Referrals: Refer to Primary Care Provider    Physical Exam Vitals and nursing note reviewed. Exam conducted with a chaperone present.  Constitutional:      General: He is not in acute distress.    Appearance: Normal appearance. He is not ill-appearing.  HENT:     Head: Normocephalic.  Eyes:     General:        Right eye: No discharge.     Pupils: Pupils are equal, round, and reactive to light.  Cardiovascular:     Rate and Rhythm: Normal rate.  Pulmonary:     Effort: Pulmonary effort is normal. No respiratory distress.  Musculoskeletal:        General: Normal range of motion.     Cervical back: Normal range of motion.  Skin:    General: Skin is warm and dry.  Neurological:     Mental Status: He is alert and oriented to person, place, and time.  Psychiatric:        Attention and Perception: He perceives auditory and visual (seeing shadows) hallucinations.        Mood and Affect: Mood is anxious and depressed.        Speech: Speech normal.         Behavior: Behavior normal. Behavior is cooperative.        Thought Content: Thought content is not paranoid or delusional. Thought content includes suicidal ideation. Thought content does not include homicidal ideation. Thought content includes suicidal plan.        Cognition and Memory: Cognition normal.        Judgment: Judgment normal.    Review of Systems  Constitutional:  Positive for malaise/fatigue.  HENT: Negative.    Eyes: Negative.   Cardiovascular:  Negative for chest pain and palpitations.  Gastrointestinal:  Positive for nausea. Negative for abdominal pain, constipation and diarrhea.  Genitourinary:  Negative for dysuria.  Musculoskeletal:  Positive for back pain and myalgias.  Psychiatric/Behavioral:  Positive for depression, hallucinations (States hearing voices and seeing shadows) and suicidal ideas. Substance abuse: Denies at this time..The patient is nervous/anxious and has insomnia.     Blood pressure 115/77, pulse (!) 106, temperature 98.3 F (36.8 C), temperature source Oral, resp. rate 20, SpO2 99 %. There is no height or weight on file to calculate BMI.  Past Psychiatric History:  Past Medical History:  Diagnosis Date   Hypertension    Insomnia disorder  Schizophrenia ([place])       Is the patient at risk to self? Yes  Has the patient been a risk to self in the past 6 months? No .    Has the patient been a risk to self within the distant past? No   Is the patient a risk to others? No   Has the patient been a risk to others in the past 6 months? No   Has the patient been a risk to others within the distant past? No   Past Medical History:  Past Medical History:  Diagnosis Date   Hypertension    Insomnia disorder    Schizophrenia ([place])     Family History: History reviewed. No pertinent family history.   None reported  Social History:  Social History   Socioeconomic History   Marital status: Single    Spouse name: Not on file   Number of  children: 1   Years of education: Not on file   Highest education level: 9th grade  Occupational History   Not on file  Tobacco Use   Smoking status: Former    Types: Cigars, Cigarettes    Quit date: 11/07/2019    Years since quitting: 2.4   Smokeless tobacco: Current    Types: Chew  Vaping Use   Vaping Use: Never used  Substance and Sexual Activity   Alcohol use: Yes    Comment: 1-2 beers every 2 days   Drug use: Yes    Types: Marijuana, Cocaine    Comment: occasional THC use, 12/15/20 not now   Sexual activity: Yes    Birth control/protection: Condom  Other Topics Concern   Not on file  Social History Narrative   Completed 9th grade special ed   12/13/20 lives alone in apt   Caffeine- sodas 2 a day, occas energy drink    Social Determinants of Health   Financial Resource Strain: Not on file  Food Insecurity: Not on file  Transportation Needs: Not on file  Physical Activity: Not on file  Stress: Not on file  Social Connections: Not on file  Intimate Partner Violence: Not on file     Last Labs:  No visits with results within 6 Month(s) from this visit.  Latest known visit with results is:  Admission on 08/01/2021, Discharged on 08/02/2021  Component Date Value Ref Range Status   SARS Coronavirus 2 by RT PCR 08/01/2021 NEGATIVE  NEGATIVE Final   Comment: (NOTE) SARS-CoV-2 target nucleic acids are NOT DETECTED.  The SARS-CoV-2 RNA is generally detectable in upper respiratory specimens during the acute phase of infection. The lowest concentration of SARS-CoV-2 viral copies this assay can detect is 138 copies/mL. A negative result does not preclude SARS-Cov-2 infection and should not be used as the sole basis for treatment or other patient management decisions. A negative result may occur with  improper specimen collection/handling, submission of specimen other than nasopharyngeal swab, presence of viral mutation(s) within the areas targeted by this assay, and  inadequate number of viral copies(<138 copies/mL). A negative result must be combined with clinical observations, patient history, and epidemiological information. The expected result is Negative.  Fact Sheet for Patients:  EntrepreneurPulse.com.au  Fact Sheet for Healthcare Providers:  IncredibleEmployment.be  This test is no                          t yet approved or cleared by the [place] and  has been authorized  for detection and/or diagnosis of SARS-CoV-2 by FDA under an Emergency Use Authorization (EUA). This EUA will remain  in effect (meaning this test can be used) for the duration of the COVID-19 declaration under Section 564(b)(1) of the Act, 21 U.S.C.section 360bbb-3(b)(1), unless the authorization is terminated  or revoked sooner.       Influenza A by PCR 08/01/2021 NEGATIVE  NEGATIVE Final   Influenza B by PCR 08/01/2021 NEGATIVE  NEGATIVE Final   Comment: (NOTE) The Xpert Xpress SARS-CoV-2/FLU/RSV plus assay is intended as an aid in the diagnosis of influenza from Nasopharyngeal swab specimens and should not be used as a sole basis for treatment. Nasal washings and aspirates are unacceptable for Xpert Xpress SARS-CoV-2/FLU/RSV testing.  Fact Sheet for Patients: EntrepreneurPulse.com.au  Fact Sheet for Healthcare Providers: IncredibleEmployment.be  This test is not yet approved or cleared by the [place] FDA and has been authorized for detection and/or diagnosis of SARS-CoV-2 by FDA under an Emergency Use Authorization (EUA). This EUA will remain in effect (meaning this test can be used) for the duration of the COVID-19 declaration under Section 564(b)(1) of the Act, 21 U.S.C. section 360bbb-3(b)(1), unless the authorization is terminated or revoked.  Performed at [facility], [place] [address], [place], [place] [address]    WBC 08/01/2021 8.8  4.0 - 10.5 K/uL Final    RBC 08/01/2021 5.89 (H)  4.22 - 5.81 MIL/uL Final   Hemoglobin 08/01/2021 18.0 (H)  13.0 - 17.0 g/dL Final   HCT 08/01/2021 54.1 (H)  39.0 - 52.0 % Final   MCV 08/01/2021 91.9  80.0 - 100.0 fL Final   MCH 08/01/2021 30.6  26.0 - 34.0 pg Final   MCHC 08/01/2021 33.3  30.0 - 36.0 g/dL Final   RDW 08/01/2021 13.2  11.5 - 15.5 % Final   Platelets 08/01/2021 276  150 - 400 K/uL Final   nRBC 08/01/2021 0.0  0.0 - 0.2 % Final   Neutrophils Relative % 08/01/2021 63  % Final   Neutro Abs 08/01/2021 5.5  1.7 - 7.7 K/uL Final   Lymphocytes Relative 08/01/2021 27  % Final   Lymphs Abs 08/01/2021 2.4  0.7 - 4.0 K/uL Final   Monocytes Relative 08/01/2021 8  % Final   Monocytes Absolute 08/01/2021 0.7  0.1 - 1.0 K/uL Final   Eosinophils Relative 08/01/2021 1  % Final   Eosinophils Absolute 08/01/2021 0.1  0.0 - 0.5 K/uL Final   Basophils Relative 08/01/2021 1  % Final   Basophils Absolute 08/01/2021 0.1  0.0 - 0.1 K/uL Final   Immature Granulocytes 08/01/2021 0  % Final   Abs Immature Granulocytes 08/01/2021 0.01  0.00 - 0.07 K/uL Final   Performed at [facility], [place] [address], [place], [place] [address]   Sodium 08/01/2021 137  135 - 145 mmol/L Final   Potassium 08/01/2021 3.6  3.5 - 5.1 mmol/L Final   Chloride 08/01/2021 101  98 - 111 mmol/L Final   CO2 08/01/2021 21 (L)  22 - 32 mmol/L Final   Glucose, Bld 08/01/2021 83  70 - 99 mg/dL Final   Glucose reference range applies only to samples taken after fasting for at least 8 hours.   BUN 08/01/2021 9  6 - 20 mg/dL Final   Creatinine, Ser 08/01/2021 0.77  0.61 - 1.24 mg/dL Final   Calcium 08/01/2021 9.3  8.9 - 10.3 mg/dL Final   Total Protein 08/01/2021 8.2 (H)  6.5 - 8.1 g/dL Final  Albumin 08/01/2021 4.0  3.5 - 5.0 g/dL Final   AST 08/01/2021 19  15 - 41 U/L Final   ALT 08/01/2021 28  0 - 44 U/L Final   Alkaline Phosphatase 08/01/2021 89  38 - 126 U/L Final   Total Bilirubin 08/01/2021 0.4  0.3 - 1.2 mg/dL Final   GFR, Estimated  08/01/2021 >60  >60 mL/min Final   Comment: (NOTE) Calculated using the CKD-EPI Creatinine Equation (2021)    Anion gap 08/01/2021 15  5 - 15 Final   Performed at [facility] [address] [place], [place] [address]   Hgb A1c MFr Bld 08/01/2021 5.8 (H)  4.8 - 5.6 % Final   Comment: (NOTE)         Prediabetes: 5.7 - 6.4         Diabetes: >6.4         Glycemic control for adults with diabetes: <7.0    Mean Plasma Glucose 08/01/2021 120  mg/dL Final   Comment: (NOTE) Performed At: [facility] [place], [place] [id number] Rush Farmer MD [id number]    Cholesterol 08/01/2021 238 (H)  0 - 200 mg/dL Final   Triglycerides 08/01/2021 498 (H)  <150 mg/dL Final   HDL 08/01/2021 35 (L)  >40 mg/dL Final   Total CHOL/HDL Ratio 08/01/2021 6.8  RATIO Final   VLDL 08/01/2021 UNABLE TO CALCULATE IF TRIGLYCERIDE OVER 400 mg/dL  0 - 40 mg/dL Final   LDL Cholesterol 08/01/2021 UNABLE TO CALCULATE IF TRIGLYCERIDE OVER 400 mg/dL  0 - 99 mg/dL Final   Comment:        Total Cholesterol/HDL:CHD Risk Coronary Heart Disease Risk Table                     Men   Women  1/2 Average Risk   3.4   3.3  Average Risk       5.0   4.4  2 X Average Risk   9.6   7.1  3 X Average Risk  23.4   11.0        Use the calculated Patient Ratio above and the CHD Risk Table to determine the patient's CHD Risk.        ATP III CLASSIFICATION (LDL):  <100     mg/dL   Optimal  100-129  mg/dL   Near or Above                    Optimal  130-159  mg/dL   Borderline  160-189  mg/dL   High  >190     mg/dL   Very High Performed at [facility] [address], [place], [place] [address]    TSH 08/01/2021 1.273  0.350 - 4.500 uIU/mL Final   Comment: Performed by a 3rd Generation assay with a functional sensitivity of <=0.01 uIU/mL. Performed at [facility], [place] [address], [place], [place] [address]    Prolactin 08/01/2021 0.7 (L)  4.0 - 15.2 ng/mL Final   Comment:  (NOTE) Performed At: [facility] [place], [place] [id number] Rush Farmer MD [id number]    POC Amphetamine UR 08/01/2021 None Detected  NONE DETECTED (Cut Off Level 1000 ng/mL) Final   POC Secobarbital (BAR) 08/01/2021 None Detected  NONE DETECTED (Cut Off Level 300 ng/mL) Final   POC Buprenorphine (BUP) 08/01/2021 None Detected  NONE DETECTED (Cut Off Level 10 ng/mL) Final   POC Oxazepam (BZO) 08/01/2021  None Detected  NONE DETECTED (Cut Off Level 300 ng/mL) Final   POC Cocaine UR 08/01/2021 Positive (A)  NONE DETECTED (Cut Off Level 300 ng/mL) Final   POC Methamphetamine UR 08/01/2021 None Detected  NONE DETECTED (Cut Off Level 1000 ng/mL) Final   POC Morphine 08/01/2021 None Detected  NONE DETECTED (Cut Off Level 300 ng/mL) Final   POC Methadone UR 08/01/2021 None Detected  NONE DETECTED (Cut Off Level 300 ng/mL) Final   POC Oxycodone UR 08/01/2021 None Detected  NONE DETECTED (Cut Off Level 100 ng/mL) Final   POC Marijuana UR 08/01/2021 Positive (A)  NONE DETECTED (Cut Off Level 50 ng/mL) Final   SARSCOV2ONAVIRUS 2 AG 08/01/2021 NEGATIVE  NEGATIVE Final   Comment: (NOTE) SARS-CoV-2 antigen NOT DETECTED.   Negative results are presumptive.  Negative results do not preclude SARS-CoV-2 infection and should not be used as the sole basis for treatment or other patient management decisions, including infection  control decisions, particularly in the presence of clinical signs and  symptoms consistent with COVID-19, or in those who have been in contact with the virus.  Negative results must be combined with clinical observations, patient history, and epidemiological information. The expected result is Negative.  Fact Sheet for Patients: HandmadeRecipes.com.cy  Fact Sheet for Healthcare Providers: FuneralLife.at  This test is not yet approved or cleared by the [place] FDA and  has been authorized for  detection and/or diagnosis of SARS-CoV-2 by FDA under an Emergency Use Authorization (EUA).  This EUA will remain in effect (meaning this test can be used) for the duration of  the COV                          ID-19 declaration under Section 564(b)(1) of the Act, 21 U.S.C. section 360bbb-3(b)(1), unless the authorization is terminated or revoked sooner.     Direct LDL 08/01/2021 161.3 (H)  0 - 99 mg/dL Final   Performed at [facility] [address], [place], [place] [address]   Color, Urine 08/02/2021 YELLOW  YELLOW Final   APPearance 08/02/2021 CLEAR  CLEAR Final   Specific Gravity, Urine 08/02/2021 1.014  1.005 - 1.030 Final   pH 08/02/2021 5.0  5.0 - 8.0 Final   Glucose, UA 08/02/2021 NEGATIVE  NEGATIVE mg/dL Final   Hgb urine dipstick 08/02/2021 MODERATE (A)  NEGATIVE Final   Bilirubin Urine 08/02/2021 NEGATIVE  NEGATIVE Final   Ketones, ur 08/02/2021 NEGATIVE  NEGATIVE mg/dL Final   Protein, ur 08/02/2021 NEGATIVE  NEGATIVE mg/dL Final   Nitrite 08/02/2021 NEGATIVE  NEGATIVE Final   Leukocytes,Ua 08/02/2021 NEGATIVE  NEGATIVE Final   RBC / HPF 08/02/2021 0-5  0 - 5 RBC/hpf Final   WBC, UA 08/02/2021 0-5  0 - 5 WBC/hpf Final   Bacteria, UA 08/02/2021 NONE SEEN  NONE SEEN Final   Squamous Epithelial / HPF 08/02/2021 0-5  0 - 5 Final   Performed at [facility], [place] [address], [place], [place] [address]    Allergies: Patient has no known allergies.  Medications:  Facility Ordered Medications  Medication   acetaminophen (TYLENOL) tablet 650 mg   alum & mag hydroxide-simeth (MAALOX/MYLANTA) 200-200-20 MG/5ML suspension 30 mL   magnesium hydroxide (MILK OF MAGNESIA) suspension 30 mL   FLUoxetine (PROZAC) capsule 20 mg   prazosin (MINIPRESS) capsule 5 mg   traZODone (DESYREL) tablet 150 mg   valbenazine (INGREZZA) capsule 80 mg   QUEtiapine (SEROQUEL XR) 24 hr  tablet 800 mg   mirtazapine (REMERON) tablet 15 mg   PTA Medications  Medication Sig   prazosin  (MINIPRESS) 5 MG capsule Take 1 capsule (5 mg total) by mouth at bedtime.   FLUoxetine (PROZAC) 20 MG capsule Take 1 capsule (20 mg total) by mouth daily.   mirtazapine (REMERON) 15 MG tablet Take 1 tablet (15 mg total) by mouth at bedtime.   QUEtiapine (SEROQUEL XR) 400 MG 24 hr tablet Take 2 tablets (800 mg total) by mouth at bedtime.   traZODone (DESYREL) 150 MG tablet Take 1 tablet (150 mg total) by mouth at bedtime as needed for sleep.   valbenazine (INGREZZA) 80 MG capsule Take 1 capsule (80 mg total) by mouth at bedtime.    Medical Decision Making  Josephanthony Jokela was admitted to [facility] continuous assessment unit for Schizoaffective disorder, depressive type [facility]), crisis management, and stabilization. Routine labs ordered, which include  Lab Orders         Resp panel by RT-PCR (RSV, Flu A&B, Covid) Anterior Nasal Swab         CBC with Differential/Platelet         Comprehensive metabolic panel         Hemoglobin A1c         Magnesium         Ethanol         Lipid panel         TSH         Prolactin         Urinalysis, Routine w reflex microscopic -Urine, Clean Catch         POCT Urine Drug Screen - (I-Screen)    Medication Management: Medications started  (Home medications restarted)   Will maintain continuous observation for safety. Social work will continue to find appropriate bed for inpatient psychiatric treatment.    Recommendations  Based on my evaluation the patient does not appear to have an emergency medical condition.  Lundynn Cohoon, NP 04/18/22  7:06 PM
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
## 2022-04-18 NOTE — BH Assessment (Signed)
Disposition: Per Suvon Rankin, NP pt was admitted to [facility] for Continuous Assessment.    Vertell Novak, [place], [facility], [facility] Triage Specialist [phone]

## 2022-04-19 ENCOUNTER — Encounter (HOSPITAL_COMMUNITY): Payer: Self-pay | Admitting: Registered Nurse

## 2022-04-19 LAB — HEMOGLOBIN A1C
Hgb A1c MFr Bld: 5.8 % — ABNORMAL HIGH (ref 4.8–5.6)
Mean Plasma Glucose: 120 mg/dL

## 2022-04-19 MED ORDER — PRAZOSIN HCL 5 MG PO CAPS: 5.0000 mg | ORAL_CAPSULE | Freq: Every day | ORAL | 0 refills | Status: DC

## 2022-04-19 MED ORDER — QUETIAPINE FUMARATE ER 400 MG PO TB24: 800.0000 mg | ORAL_TABLET | Freq: Every day | ORAL | Status: DC

## 2022-04-19 MED ORDER — PRAZOSIN HCL 5 MG PO CAPS: 5.0000 mg | ORAL_CAPSULE | Freq: Every day | ORAL | Status: DC

## 2022-04-19 MED ORDER — QUETIAPINE FUMARATE ER 400 MG PO TB24: 800.0000 mg | ORAL_TABLET | Freq: Every day | ORAL | 0 refills | Status: DC

## 2022-04-19 NOTE — ED Provider Notes (Signed)
FBC/OBS ASAP Discharge Summary  Date and Time: 04/19/2022 3:16 PM  Name: Edwin Henry  MRN:  [id number]   Discharge Diagnoses:  Final diagnoses:  Schizoaffective disorder, depressive type [facility])    Subjective: Edwin Henry 62 yr old male with history of schizoaffective disorder present to [facility] as walk in accompanied by his [place] ACTT team case worker ([place]) with complaints of suicidal ideation and plan to walk into traffic.     Patient seen face to face by this provider, consulted with Dr. Hampton Abbot; and chart reviewed on 04/19/22.  On evaluation Edwin Henry reports he is feeling better today since he was able to get some sleep last night.  He states he continues to hear voices but not as bad as yesterday.  He reports he is no longer seeing shadows.  He has no medical complaints at this time.  Patient informed that his EtOH level was 86 after informing that he doesn't drink alcohol daily.  Encouraged to be honest so the proper help could be given if he was having any withdrawal symptoms.  Patient states he only drinks 2-3 a week and that he is not using any illicit drugs.  He also denies any withdrawal symptoms.  Patient also informs that he wasn't compliant with his medications.  Stating that he would go days without taking.  Patient states that suicidal thoughts are no longer active related to getting some sleep last night.  Patient is resting in his bed at this time During evaluation Edwin Henry is lying in bed with no noted distress.  He is alert/oriented x 4, calm, cooperative, and attentive.  His responses were appropriate to assessment questions.  His mood is dysphoric with congruent affect.  He spoke in a clear tone at moderate volume, and normal pace, with good eye contact.   He denies active suicidal ideation at this time and is able to contract for safety.  He also denies homicidal ideation.  He continues to endorse auditory hallucinations but states they have improved.   Objectively:  there is no evidence of psychosis other than patients endorsement of auditory hallucination.  There is no evidence of  delusional thinking.  He conversed coherently, with goal directed thoughts, and no distractibility, or pre-occupation.  Patient has been restarted on medications.  Today he reports he doesn't feel that he needs inpatient psychiatric treatment that he just needed some sleep and now that he has had some sleep he is feeling better.  His decompensation most likely related to his non-compliance with medications and lack of sleep.       At this time Edwin Henry is educated and verbalizes understanding of mental health resources and other crisis services in the community. He is instructed to call 911 and present to the nearest emergency room should he experience any suicidal/homicidal ideation, auditory/visual/hallucinations, or detrimental worsening of his mental health condition.  Instructed to follow up with [facility] ACTT team and to take his medications as instructed.     Collateral Information:  Spoke with [facility] ACTT nurse [place], RN who came to pick patient up.  Reports he was aware that patient hadn't been sleeping and that patient informed that he had double up on sleep medications before that they had instructed him not to and to take as prescribed.  Informed RN that patient reported to staff that he had not been taking medications on a daily basis like he was suppose to.  He is also aware that patient refused inpatient psychiatric treatment  stating that he was better.  States that he will follow up with patient today and also help patient to schedule appointment with his primary care doctor at [facility].    Stay Summary: Edwin Henry was admitted for Schizoaffective disorder, depressive type [facility]), crisis management, safety, and stabilization.  Medical problems were identified and treated as needed.  Home medications were restarted, adjusted, or new medications added as  needed or appropriate.  Medications treated with during admission are as follows.  FLUoxetine  20 mg Oral Daily   mirtazapine  15 mg Oral QHS   prazosin  5 mg Oral QHS   QUEtiapine  800 mg Oral QHS   valbenazine  80 mg Oral QHS   Labs ordered for review:  Routine labs: CBC/Diff, CMP, HgB A1c, Lipid Profile, Magnesium, Prolactin, TSH Lab Orders         Resp panel by RT-PCR (RSV, Flu A&B, Covid) Anterior Nasal Swab         CBC with Differential/Platelet         Comprehensive metabolic panel         Hemoglobin A1c         Magnesium         Ethanol         Lipid panel         TSH         Prolactin         Urinalysis, Routine w reflex microscopic -Urine, Clean Catch         POCT Urine Drug Screen - (I-Screen)     Improvement was monitored by staff observation and clinical report along with Edwin Henry 's verbal report of emotional status and symptom reduction.  Upon completion of this admission Edwin Henry was both mentally and medically stable for discharge denying suicidal/homicidal ideation, auditory/visual/tactile hallucinations, delusional thoughts, and paranoia.    Edwin Henry was evaluated by the treatment team for stability and plans for continued recovery upon discharge. Edwin Henry 's motivation was an integral factor for scheduling further treatment. Employment, transportation, bed availability, health status, family support, and any pending legal issues were also considered during stay. He was offered further treatment options upon discharge including but not limited to Residential, Intensive Outpatient, and Outpatient treatment.   Edwin Henry will follow up with  Follow-up Information     Call  [facility].   Why: Follow up with ACTT team for medication management and counseling Contact information: [address]  [place] [address] [phone]         Call  [facility].   Why: If you don't have a primary care  doctor call to schedule an intake appointment for primary care provider to address gastrointestinal issues (appetite). Contact information: [address] [place] [address] [place] [phone]                 Discharge Instructions      Patient being transferred to [facility] for inpatient psychiatric treatment     Total Time spent with patient: 20 minutes  Past Psychiatric History:  Patient Active Problem List   Diagnosis Date Noted   Cocaine use disorder, mild, abuse ([place]) 08/03/2021   Cannabis use disorder, mild, abuse 08/03/2021   Drug-induced parkinsonism ([place]) 12/15/2020   Schizoaffective disorder, depressive type ([place]) 12/15/2020    Past Medical History:  Past Medical History:  Diagnosis Date   Hypertension    Insomnia disorder  Schizophrenia ([place])     Family History: History reviewed. No pertinent family history.   None reported Family Psychiatric History: None reported Social History:  Social History   Socioeconomic History   Marital status: Single    Spouse name: Not on file   Number of children: 1   Years of education: Not on file   Highest education level: 9th grade  Occupational History   Not on file  Tobacco Use   Smoking status: Former    Types: Cigars, Cigarettes    Quit date: 11/07/2019    Years since quitting: 2.4   Smokeless tobacco: Current    Types: Chew  Vaping Use   Vaping Use: Never used  Substance and Sexual Activity   Alcohol use: Yes    Comment: 1-2 beers every 2 days   Drug use: Yes    Types: Marijuana, Cocaine    Comment: occasional THC use, 12/15/20 not now   Sexual activity: Yes    Birth control/protection: Condom  Other Topics Concern   Not on file  Social History Narrative   Completed 9th grade special ed   12/13/20 lives alone in apt   Caffeine- sodas 2 a day, occas energy drink    Social Determinants of Health   Financial Resource Strain: Not on file  Food  Insecurity: Not on file  Transportation Needs: Not on file  Physical Activity: Not on file  Stress: Not on file  Social Connections: Not on file  Intimate Partner Violence: Not on file    Tobacco Cessation:  A prescription for an FDA-approved tobacco cessation medication was offered at discharge and the patient refused  Current Medications:  Current Facility-Administered Medications  Medication Dose Route Frequency Provider Last Rate Last Admin   acetaminophen (TYLENOL) tablet 650 mg  650 mg Oral Q6H PRN Laquinn Shippy B, NP       alum & mag hydroxide-simeth (MAALOX/MYLANTA) 200-200-20 MG/5ML suspension 30 mL  30 mL Oral Q4H PRN Kashif Pooler B, NP       FLUoxetine (PROZAC) capsule 20 mg  20 mg Oral Daily Chakira Jachim B, NP   20 mg at 04/19/22 1133   magnesium hydroxide (MILK OF MAGNESIA) suspension 30 mL  30 mL Oral Daily PRN Ivyrose Hashman B, NP       mirtazapine (REMERON) tablet 15 mg  15 mg Oral QHS Khaalid Lefkowitz B, NP   15 mg at 04/18/22 2111   prazosin (MINIPRESS) capsule 5 mg  5 mg Oral QHS Abdulrahim Siddiqi B, NP   5 mg at 04/18/22 2110   QUEtiapine (SEROQUEL XR) 24 hr tablet 800 mg  800 mg Oral QHS Larri Yehle B, NP   800 mg at 04/18/22 2111   traZODone (DESYREL) tablet 150 mg  150 mg Oral QHS PRN Frans Valente B, NP       valbenazine (INGREZZA) capsule 80 mg  80 mg Oral QHS Nillie Bartolotta B, NP   80 mg at 04/18/22 2111   Current Outpatient Medications  Medication Sig Dispense Refill   FLUoxetine (PROZAC) 20 MG capsule Take 1 capsule (20 mg total) by mouth daily. (Patient not taking: Reported on 04/19/2022) 30 capsule 0   mirtazapine (REMERON) 15 MG tablet Take 1 tablet (15 mg total) by mouth at bedtime. (Patient not taking: Reported on 04/19/2022) 30 tablet 0   prazosin (MINIPRESS) 5 MG capsule Take 1 capsule (5 mg total) by mouth at bedtime. 30 capsule 0   QUEtiapine (SEROQUEL XR) 400 MG 24 hr tablet  Take 2 tablets (800 mg total) by mouth at bedtime. 30 tablet 0   traZODone  (DESYREL) 150 MG tablet Take 1 tablet (150 mg total) by mouth at bedtime as needed for sleep. (Patient not taking: Reported on 04/19/2022) 30 tablet 0   valbenazine (INGREZZA) 80 MG capsule Take 1 capsule (80 mg total) by mouth at bedtime. (Patient not taking: Reported on 04/19/2022) 30 capsule 0    PTA Medications:  Facility Ordered Medications  Medication   acetaminophen (TYLENOL) tablet 650 mg   alum & mag hydroxide-simeth (MAALOX/MYLANTA) 200-200-20 MG/5ML suspension 30 mL   magnesium hydroxide (MILK OF MAGNESIA) suspension 30 mL   FLUoxetine (PROZAC) capsule 20 mg   prazosin (MINIPRESS) capsule 5 mg   traZODone (DESYREL) tablet 150 mg   valbenazine (INGREZZA) capsule 80 mg   QUEtiapine (SEROQUEL XR) 24 hr tablet 800 mg   mirtazapine (REMERON) tablet 15 mg   PTA Medications  Medication Sig   FLUoxetine (PROZAC) 20 MG capsule Take 1 capsule (20 mg total) by mouth daily. (Patient not taking: Reported on 04/19/2022)   mirtazapine (REMERON) 15 MG tablet Take 1 tablet (15 mg total) by mouth at bedtime. (Patient not taking: Reported on 04/19/2022)   traZODone (DESYREL) 150 MG tablet Take 1 tablet (150 mg total) by mouth at bedtime as needed for sleep. (Patient not taking: Reported on 04/19/2022)   valbenazine (INGREZZA) 80 MG capsule Take 1 capsule (80 mg total) by mouth at bedtime. (Patient not taking: Reported on 04/19/2022)   prazosin (MINIPRESS) 5 MG capsule Take 1 capsule (5 mg total) by mouth at bedtime.   QUEtiapine (SEROQUEL XR) 400 MG 24 hr tablet Take 2 tablets (800 mg total) by mouth at bedtime.        No data to display          [facility] from 04/18/2022 in [facility] Admission (Discharged) from 08/02/2021 in [facility] ED from 08/01/2021 in [facility] No Risk Low Risk Low Risk       Musculoskeletal  Strength & Muscle Tone: within normal limits Gait &  Station: normal Patient leans: N/A  Psychiatric Specialty Exam  Presentation  General Appearance:  Appropriate for Environment  Eye Contact: Good  Speech: Clear and Coherent; Normal Rate  Speech Volume: Normal  Handedness: Right   Mood and Affect  Mood: Anxious; Depressed  Affect: Congruent   Thought Process  Thought Processes: Coherent; Goal Directed  Descriptions of Associations:Intact  Orientation:Full (Time, Place and Person)  Thought Content:Logical  Diagnosis of Schizophrenia or Schizoaffective disorder in past: Yes  Duration of Psychotic Symptoms: Less than six months   Hallucinations:Hallucinations: Auditory (Denies visual hallucinations at this time) Description of Auditory Hallucinations: Reports improvement in auditory hallucinations.  Ideas of Reference:None  Suicidal Thoughts:Suicidal Thoughts: Yes, Passive SI Passive Intent and/or Plan: Without Intent (States that he no longer wants to kill himself. States he is feeling better after he slept)  Homicidal Thoughts:Homicidal Thoughts: No   Sensorium  Memory: Immediate Good; Recent Good; Remote Good  Judgment: Fair  Insight: Fair; Present   Executive Functions  Concentration: Good  Attention Span: Good  Recall: Good  Fund of Knowledge: Good  Language: Good   Psychomotor Activity  Psychomotor Activity: Psychomotor Activity: Normal   Assets  Assets: Communication Skills; Desire for Improvement; Housing; Leisure Time; Social Support   Sleep  Sleep: Sleep: Poor (States that he did sleep last night)  Nutritional Assessment (For OBS and FBC admissions only) Has the patient had a weight loss or gain of 10 pounds or more in the last 3 months?: Yes Has the patient had a decrease in food intake/or appetite?: Yes Does the patient have dental problems?: No Does the patient have eating habits or behaviors that may be indicators of an eating disorder including binging or  inducing vomiting?: Yes Has the patient recently lost weight without trying?: 3 Has the patient been eating poorly because of a decreased appetite?: 1 (States he did eat well last night) Malnutrition Screening Tool Score: 4 Nutritional Assessment Referrals: Refer to Primary Care Provider    Physical Exam  Physical Exam Vitals and nursing note reviewed. Exam conducted with a chaperone present.  Constitutional:      General: He is not in acute distress.    Appearance: Normal appearance. He is not ill-appearing.  HENT:     Head: Normocephalic.  Eyes:     General:        Right eye: No discharge.     Conjunctiva/sclera: Conjunctivae normal.  Cardiovascular:     Rate and Rhythm: Tachycardia present.  Pulmonary:     Effort: Pulmonary effort is normal. No respiratory distress.  Musculoskeletal:        General: Normal range of motion.     Cervical back: Normal range of motion.  Skin:    General: Skin is warm and dry.  Neurological:     Mental Status: He is alert and oriented to person, place, and time.  Psychiatric:        Attention and Perception: He perceives auditory hallucinations. He does not perceive visual (States he is not seeing anything at this time) hallucinations.        Mood and Affect: Mood is anxious and depressed.        Speech: Speech normal.        Behavior: Behavior normal. Behavior is cooperative.        Thought Content: Thought content is not paranoid or delusional. Thought content includes suicidal ideation. Thought content does not include homicidal ideation. Thought content includes suicidal plan.        Cognition and Memory: Cognition normal.        Judgment: Judgment normal.    Review of Systems  Constitutional:  Positive for malaise/fatigue.  HENT: Negative.    Eyes: Negative.   Respiratory:  Negative for cough, shortness of breath and wheezing.   Cardiovascular:  Negative for chest pain.  Gastrointestinal:  Negative for abdominal pain, constipation,  diarrhea and nausea.  Genitourinary:  Negative for dysuria.  Musculoskeletal:  Positive for back pain and myalgias.  Neurological:  Negative for tremors, seizures and headaches.  Psychiatric/Behavioral:  Positive for depression (Stable), hallucinations (Reports auditory hallucinations have improved and he is no longer seeing shadows) and suicidal ideas (Denies active suicidal thoughts at this time). Substance abuse: States that he is not drinking daily.  "I only drink about 1 or 2 beers a week"  Denies withdrawal symptoms.The patient is nervous/anxious (Stable) and has insomnia (States he was able to sleep last night and has been resting and sleeping on/off most of the day).    Blood pressure 98/65, pulse (!) 117, temperature 97.7 F (36.5 C), resp. rate 17, SpO2 100 %. There is no height or weight on file to calculate BMI.  Demographic Factors:  Male  Loss Factors: NA  Historical Factors: History of personal drug and alcohol use  Risk Reduction Factors:  Religious beliefs about death, Positive social support, and Positive therapeutic relationship  Continued Clinical Symptoms:  Previous Psychiatric Diagnoses and Treatments  Cognitive Features That Contribute To Risk:  None    Suicide Risk:  Minimal: No identifiable suicidal ideation.  Patients presenting with no risk factors but with morbid ruminations; may be classified as minimal risk based on the severity of the depressive symptoms  Plan Of Care/Follow-up recommendations:  Other:  Follow up with [facility] for psychiatric medication management.  Follow up with primary care doctor for any appetite or gastrointestinal concerns.    Disposition: No evidence of imminent risk to self or others at present.   Patient does not meet criteria for psychiatric inpatient admission. Supportive therapy provided about ongoing stressors. Discussed crisis plan, support from social network, calling 911, coming to the Emergency Department, and  calling Suicide Hotline.  Elford Evilsizer, NP 04/19/2022, 3:16 PM

## 2022-04-19 NOTE — Progress Notes (Addendum)
Report was called to nurse Anderson Malta at [facility]. Shortly thereafter Safe transport was notified.

## 2022-04-19 NOTE — Discharge Instructions (Addendum)
Patient being transferred to [facility] for inpatient psychiatric treatment

## 2022-04-19 NOTE — ED Provider Notes (Signed)
Behavioral Health Progress Note  Date and Time: 04/19/2022 1:54 PM Name: Edwin Henry MRN:  [id number]  Patient seen face to face by this provider, consulted with Dr. Hampton Abbot; and chart reviewed on 04/19/22.  On evaluation Edwin Henry reports he is feeling better today since he was able to get some sleep last night.  He states he continues to hear voices but not as bad as yesterday.  He reports he is no longer seeing shadows.  He has no medical complaints at this time.  Patient informed that his EtOH level was 86 after informing that he doesn't drink alcohol daily.  Encouraged to be honest so the proper help could be given if he was having any withdrawal symptoms.  Patient states he only drinks 2-3 a week and that he is not using any illicit drugs.  He also denies any withdrawal symptoms.  Patient also informs that he wasn't compliant with his medications.  Stating that he would go days without taking.  Patient states that suicidal thoughts are no longer active related to getting some sleep last night.  Patient is resting in his bed at this time During evaluation Edwin Henry is lying in bed with no noted distress.  He is alert/oriented x 4, calm, cooperative, and attentive.  His responses were appropriate to assessment questions.  His mood is dysphoric with congruent affect.  He spoke in a clear tone at moderate volume, and normal pace, with good eye contact.   He denies active suicidal ideation at this time and is able to contract for safety.  He also denies homicidal ideation.  He continues to endorse auditory hallucinations but states they have improved.  Objectively:  there is no evidence of psychosis other than patients endorsement of auditory hallucination.  There is no evidence of  delusional thinking.  He conversed coherently, with goal directed thoughts, and no distractibility, or pre-occupation.  Patient has been restarted on medications.  Today he reports he doesn't feel that he needs  inpatient psychiatric treatment that he just needed some sleep and now that he has had some sleep he is feeling better.  His decompensation most likely related to his non-compliance with medications and lack of sleep.      At this time Edwin Henry is educated and verbalizes understanding of mental health resources and other crisis services in the community. He is instructed to call 911 and present to the nearest emergency room should he experience any suicidal/homicidal ideation, auditory/visual/hallucinations, or detrimental worsening of his mental health condition.  Instructed to follow up with [facility] ACTT team and to take his medications as instructed.    Diagnosis:  Final diagnoses:  Schizoaffective disorder, depressive type ([place])   Total Time spent with patient: 15 minutes  Past Psychiatric History:  Patient Active Problem List   Diagnosis Date Noted   Cocaine use disorder, mild, abuse ([place]) 08/03/2021   Cannabis use disorder, mild, abuse 08/03/2021   Drug-induced parkinsonism ([place]) 12/15/2020   Schizoaffective disorder, depressive type ([place]) 12/15/2020    Past Medical History:  Past Medical History:  Diagnosis Date   Hypertension    Insomnia disorder    Schizophrenia ([place])     Family History: History reviewed. No pertinent family history.   None reported Family Psychiatric  History: .None reported Social History:  Social History   Socioeconomic History   Marital status: Single    Spouse name: Not on file   Number of children: 1   Years of education: Not on file  Highest education level: 9th grade  Occupational History   Not on file  Tobacco Use   Smoking status: Former    Types: Cigars, Cigarettes    Quit date: 11/07/2019    Years since quitting: 2.4   Smokeless tobacco: Current    Types: Chew  Vaping Use   Vaping Use: Never used  Substance and Sexual Activity   Alcohol use: Yes    Comment: 1-2 beers every 2 days   Drug use: Yes    Types: Marijuana,  Cocaine    Comment: occasional THC use, 12/15/20 not now   Sexual activity: Yes    Birth control/protection: Condom  Other Topics Concern   Not on file  Social History Narrative   Completed 9th grade special ed   12/13/20 lives alone in apt   Caffeine- sodas 2 a day, occas energy drink    Social Determinants of Health   Financial Resource Strain: Not on file  Food Insecurity: Not on file  Transportation Needs: Not on file  Physical Activity: Not on file  Stress: Not on file  Social Connections: Not on file  Intimate Partner Violence: Not on file     Additional Social History:    Pain Medications: See MARs Prescriptions: See MARs Over the Counter: See MARs History of alcohol / drug use?: Yes Longest period of sobriety (when/how long): none reported Negative Consequences of Use: Personal relationships, Work / Youth worker, Museum/gallery curator Withdrawal Symptoms: None (Denies) Name of Substance 1: Alcohol 1 - Age of First Use: 18 1 - Amount (size/oz): one can of beer 1 - Frequency: once a week 1 - Duration: NA 1 - Last Use / Amount: 2 days ago 1 - Method of Aquiring: NA 1- Route of Use: NA   Sleep: Good  Reports he slept well last night without any difficulty  Appetite:  Good States he has been eating without any difficulty.  Denies decrease in appetite or any GI concerns  Current Medications:  Current Facility-Administered Medications  Medication Dose Route Frequency Provider Last Rate Last Admin   acetaminophen (TYLENOL) tablet 650 mg  650 mg Oral Q6H PRN Randi Poullard B, NP       alum & mag hydroxide-simeth (MAALOX/MYLANTA) 200-200-20 MG/5ML suspension 30 mL  30 mL Oral Q4H PRN Sukhdeep Wieting B, NP       FLUoxetine (PROZAC) capsule 20 mg  20 mg Oral Daily Iasha Mccalister B, NP   20 mg at 04/19/22 1133   magnesium hydroxide (MILK OF MAGNESIA) suspension 30 mL  30 mL Oral Daily PRN Brindle Leyba B, NP       mirtazapine (REMERON) tablet 15 mg  15 mg Oral QHS Milen Lengacher B, NP   15  mg at 04/18/22 2111   prazosin (MINIPRESS) capsule 5 mg  5 mg Oral QHS Breena Bevacqua B, NP   5 mg at 04/18/22 2110   QUEtiapine (SEROQUEL XR) 24 hr tablet 800 mg  800 mg Oral QHS Flor Houdeshell B, NP   800 mg at 04/18/22 2111   traZODone (DESYREL) tablet 150 mg  150 mg Oral QHS PRN Shaniece Bussa B, NP       valbenazine (INGREZZA) capsule 80 mg  80 mg Oral QHS Navie Lamoreaux B, NP   80 mg at 04/18/22 2111   Current Outpatient Medications  Medication Sig Dispense Refill   FLUoxetine (PROZAC) 20 MG capsule Take 1 capsule (20 mg total) by mouth daily. (Patient not taking: Reported on 04/19/2022) 30 capsule 0  mirtazapine (REMERON) 15 MG tablet Take 1 tablet (15 mg total) by mouth at bedtime. (Patient not taking: Reported on 04/19/2022) 30 tablet 0   prazosin (MINIPRESS) 5 MG capsule Take 1 capsule (5 mg total) by mouth at bedtime.     QUEtiapine (SEROQUEL XR) 400 MG 24 hr tablet Take 2 tablets (800 mg total) by mouth at bedtime.     traZODone (DESYREL) 150 MG tablet Take 1 tablet (150 mg total) by mouth at bedtime as needed for sleep. (Patient not taking: Reported on 04/19/2022) 30 tablet 0   valbenazine (INGREZZA) 80 MG capsule Take 1 capsule (80 mg total) by mouth at bedtime. (Patient not taking: Reported on 04/19/2022) 30 capsule 0    Labs  Lab Results:  Admission on 04/18/2022  Component Date Value Ref Range Status   SARS Coronavirus 2 by RT PCR 04/18/2022 NEGATIVE  NEGATIVE Final   Influenza A by PCR 04/18/2022 NEGATIVE  NEGATIVE Final   Influenza B by PCR 04/18/2022 NEGATIVE  NEGATIVE Final   Comment: (NOTE) The Xpert Xpress SARS-CoV-2/FLU/RSV plus assay is intended as an aid in the diagnosis of influenza from Nasopharyngeal swab specimens and should not be used as a sole basis for treatment. Nasal washings and aspirates are unacceptable for Xpert Xpress SARS-CoV-2/FLU/RSV testing.  Fact Sheet for Patients: EntrepreneurPulse.com.au  Fact Sheet for Healthcare  Providers: IncredibleEmployment.be  This test is not yet approved or cleared by the [place] FDA and has been authorized for detection and/or diagnosis of SARS-CoV-2 by FDA under an Emergency Use Authorization (EUA). This EUA will remain in effect (meaning this test can be used) for the duration of the COVID-19 declaration under Section 564(b)(1) of the Act, 21 U.S.C. section 360bbb-3(b)(1), unless the authorization is terminated or revoked.     Resp Syncytial Virus by PCR 04/18/2022 NEGATIVE  NEGATIVE Final   Comment: (NOTE) Fact Sheet for Patients: EntrepreneurPulse.com.au  Fact Sheet for Healthcare Providers: IncredibleEmployment.be  This test is not yet approved or cleared by the [place] FDA and has been authorized for detection and/or diagnosis of SARS-CoV-2 by FDA under an Emergency Use Authorization (EUA). This EUA will remain in effect (meaning this test can be used) for the duration of the COVID-19 declaration under Section 564(b)(1) of the Act, 21 U.S.C. section 360bbb-3(b)(1), unless the authorization is terminated or revoked.  Performed at [facility], [place] [address] [place], [place] [address]    WBC 04/18/2022 7.3  4.0 - 10.5 K/uL Final   RBC 04/18/2022 5.48  4.22 - 5.81 MIL/uL Final   Hemoglobin 04/18/2022 16.9  13.0 - 17.0 g/dL Final   HCT 04/18/2022 48.4  39.0 - 52.0 % Final   MCV 04/18/2022 88.3  80.0 - 100.0 fL Final   MCH 04/18/2022 30.8  26.0 - 34.0 pg Final   MCHC 04/18/2022 34.9  30.0 - 36.0 g/dL Final   RDW 04/18/2022 13.7  11.5 - 15.5 % Final   Platelets 04/18/2022 312  150 - 400 K/uL Final   nRBC 04/18/2022 0.0  0.0 - 0.2 % Final   Neutrophils Relative % 04/18/2022 48  % Final   Neutro Abs 04/18/2022 3.5  1.7 - 7.7 K/uL Final   Lymphocytes Relative 04/18/2022 37  % Final   Lymphs Abs 04/18/2022 2.7  0.7 - 4.0 K/uL Final   Monocytes Relative 04/18/2022 12  % Final   Monocytes  Absolute 04/18/2022 0.9  0.1 - 1.0 K/uL Final   Eosinophils Relative 04/18/2022 2  % Final  Eosinophils Absolute 04/18/2022 0.1  0.0 - 0.5 K/uL Final   Basophils Relative 04/18/2022 1  % Final   Basophils Absolute 04/18/2022 0.1  0.0 - 0.1 K/uL Final   Immature Granulocytes 04/18/2022 0  % Final   Abs Immature Granulocytes 04/18/2022 0.02  0.00 - 0.07 K/uL Final   Performed at [facility], [place] [address] [place], [place] [address]   Sodium 04/18/2022 137  135 - 145 mmol/L Final   Potassium 04/18/2022 4.3  3.5 - 5.1 mmol/L Final   Chloride 04/18/2022 96 (L)  98 - 111 mmol/L Final   CO2 04/18/2022 26  22 - 32 mmol/L Final   Glucose, Bld 04/18/2022 79  70 - 99 mg/dL Final   Glucose reference range applies only to samples taken after fasting for at least 8 hours.   BUN 04/18/2022 13  8 - 23 mg/dL Final   Creatinine, Ser 04/18/2022 0.83  0.61 - 1.24 mg/dL Final   Calcium 04/18/2022 9.9  8.9 - 10.3 mg/dL Final   Total Protein 04/18/2022 8.2 (H)  6.5 - 8.1 g/dL Final   Albumin 04/18/2022 4.2  3.5 - 5.0 g/dL Final   AST 04/18/2022 21  15 - 41 U/L Final   ALT 04/18/2022 20  0 - 44 U/L Final   Alkaline Phosphatase 04/18/2022 111  38 - 126 U/L Final   Total Bilirubin 04/18/2022 0.6  0.3 - 1.2 mg/dL Final   GFR, Estimated 04/18/2022 >60  >60 mL/min Final   Comment: (NOTE) Calculated using the CKD-EPI Creatinine Equation (2021)    Anion gap 04/18/2022 15  5 - 15 Final   Performed at [facility] [address], [place], [place] [address]   Magnesium 04/18/2022 2.3  1.7 - 2.4 mg/dL Final   Performed at [facility] [address], [place], [place] [address]   Alcohol, Ethyl (B) 04/18/2022 86 (H)  <10 mg/dL Final   Comment: (NOTE) Lowest detectable limit for serum alcohol is 10 mg/dL.  For medical purposes only. Performed at [facility], [place] [address], [place], [place] [address]    Cholesterol 04/18/2022 217 (H)  0 - 200 mg/dL Final   Triglycerides 04/18/2022  221 (H)  <150 mg/dL Final   HDL 04/18/2022 42  >40 mg/dL Final   Total CHOL/HDL Ratio 04/18/2022 5.2  RATIO Final   VLDL 04/18/2022 44 (H)  0 - 40 mg/dL Final   LDL Cholesterol 04/18/2022 131 (H)  0 - 99 mg/dL Final   Comment:        Total Cholesterol/HDL:CHD Risk Coronary Heart Disease Risk Table                     Men   Women  1/2 Average Risk   3.4   3.3  Average Risk       5.0   4.4  2 X Average Risk   9.6   7.1  3 X Average Risk  23.4   11.0        Use the calculated Patient Ratio above and the CHD Risk Table to determine the patient's CHD Risk.        ATP III CLASSIFICATION (LDL):  <100     mg/dL   Optimal  100-129  mg/dL   Near or Above                    Optimal  130-159  mg/dL   Borderline  160-189  mg/dL   High  >190     mg/dL   Very High Performed at [facility] [address] [place], [place] [address]    TSH 04/18/2022 1.315  0.350 - 4.500 uIU/mL Final   Comment: Performed by a 3rd Generation assay with a functional sensitivity of <=0.01 uIU/mL. Performed at [facility], [place] [address] [place], [place] [address]    Color, Urine 04/18/2022 YELLOW  YELLOW Final   APPearance 04/18/2022 CLEAR  CLEAR Final   Specific Gravity, Urine 04/18/2022 1.010  1.005 - 1.030 Final   pH 04/18/2022 5.0  5.0 - 8.0 Final   Glucose, UA 04/18/2022 NEGATIVE  NEGATIVE mg/dL Final   Hgb urine dipstick 04/18/2022 MODERATE (A)  NEGATIVE Final   Bilirubin Urine 04/18/2022 NEGATIVE  NEGATIVE Final   Ketones, ur 04/18/2022 NEGATIVE  NEGATIVE mg/dL Final   Protein, ur 04/18/2022 NEGATIVE  NEGATIVE mg/dL Final   Nitrite 04/18/2022 NEGATIVE  NEGATIVE Final   Leukocytes,Ua 04/18/2022 NEGATIVE  NEGATIVE Final   RBC / HPF 04/18/2022 0-5  0 - 5 RBC/hpf Final   WBC, UA 04/18/2022 0-5  0 - 5 WBC/hpf Final   Bacteria, UA 04/18/2022 NONE SEEN  NONE SEEN Final   Squamous Epithelial / HPF 04/18/2022 0-5  0 - 5 /HPF Final   Mucus 04/18/2022 PRESENT   Final   Performed at [facility], [place] [address], [place], [place] [address]   POC Amphetamine UR 04/18/2022 None Detected  NONE DETECTED (Cut Off Level 1000 ng/mL) Final   POC Secobarbital (BAR) 04/18/2022 None Detected  NONE DETECTED (Cut Off Level 300 ng/mL) Final   POC Buprenorphine (BUP) 04/18/2022 None Detected  NONE DETECTED (Cut Off Level 10 ng/mL) Final   POC Oxazepam (BZO) 04/18/2022 None Detected  NONE DETECTED (Cut Off Level 300 ng/mL) Final   POC Cocaine UR 04/18/2022 None Detected  NONE DETECTED (Cut Off Level 300 ng/mL) Final   POC Methamphetamine UR 04/18/2022 None Detected  NONE DETECTED (Cut Off Level 1000 ng/mL) Final   POC Morphine 04/18/2022 None Detected  NONE DETECTED (Cut Off Level 300 ng/mL) Final   POC Methadone UR 04/18/2022 None Detected  NONE DETECTED (Cut Off Level 300 ng/mL) Final   POC Oxycodone UR 04/18/2022 None Detected  NONE DETECTED (Cut Off Level 100 ng/mL) Final   POC Marijuana UR 04/18/2022 None Detected  NONE DETECTED (Cut Off Level 50 ng/mL) Final    Blood Alcohol level:  Lab Results  Component Value Date   ETH 86 (H) 04/18/2022   ETH 100 (H) XX123456    Metabolic Disorder Labs: Lab Results  Component Value Date   HGBA1C 5.8 (H) 08/01/2021   MPG 120 08/01/2021   MPG 108.28 08/13/2018   Lab Results  Component Value Date   PROLACTIN 0.7 (L) 08/01/2021   PROLACTIN 22.1 (H) 10/11/2017   Lab Results  Component Value Date   CHOL 217 (H) 04/18/2022   TRIG 221 (H) 04/18/2022   HDL 42 04/18/2022   CHOLHDL 5.2 04/18/2022   VLDL 44 (H) 04/18/2022   LDLCALC 131 (H) 04/18/2022   LDLCALC UNABLE TO CALCULATE IF TRIGLYCERIDE OVER 400 mg/dL 08/01/2021    Therapeutic Lab Levels: No results found for: "LITHIUM" No results found for: "VALPROATE" No results found for: "CBMZ"  Physical Findings   AIMS    Flowsheet Row Admission (Discharged) from 08/02/2021 in [facility] Admission (Discharged) from 10/02/2018 in [facility]  Admission (Discharged) from OP Visit from 08/12/2018 in [facility] Admission (Discharged) from 05/22/2018 in [facility] Admission (Discharged) from 10/10/2017 in [facility]  AIMS Total Score 0 0 0 0 0      AUDIT    Flowsheet Row Admission (Discharged) from 08/02/2021 in [facility] Admission (Discharged) from 10/02/2018 in [facility] Admission (Discharged) from OP Visit from 08/12/2018 in [facility] Admission (Discharged) from 05/22/2018 in [facility] Admission (Discharged) from 10/10/2017 in [facility]  Alcohol Use Disorder Identification Test Final Score (AUDIT) '4 4 4 2 4      '$ Flowsheet Row ED from 04/18/2022 in [facility] Admission (Discharged) from 08/02/2021 in [facility] ED from 08/01/2021 in [facility] No Risk Low Risk Low Risk        Musculoskeletal  Strength & Muscle Tone: within normal limits Gait & Station: normal Patient leans: N/A  Psychiatric Specialty Exam  Presentation  General Appearance:  Appropriate for Environment  Eye Contact: Good  Speech: Clear and Coherent; Normal Rate  Speech Volume: Normal  Handedness: Right   Mood and Affect  Mood: Anxious; Depressed  Affect: Congruent   Thought Process  Thought Processes: Coherent; Goal Directed  Descriptions of Associations:Intact  Orientation:Full (Time, Place and Person)  Thought Content:Logical  Diagnosis of Schizophrenia or Schizoaffective disorder in past: Yes  Duration of Psychotic Symptoms: Less than six months   Hallucinations:Hallucinations: Auditory (Denies visual hallucinations at this time) Description of  Auditory Hallucinations: Reports improvement in auditory hallucinations.  Ideas of Reference:None  Suicidal Thoughts:Suicidal Thoughts: Yes, Passive SI Passive Intent and/or Plan: Without Intent (States that he no longer wants to kill himself. States he is feeling better after he slept)  Homicidal Thoughts:Homicidal Thoughts: No   Sensorium  Memory: Immediate Good; Recent Good; Remote Good  Judgment: Fair  Insight: Fair; Present   Executive Functions  Concentration: Good  Attention Span: Good  Recall: Good  Fund of Knowledge: Good  Language: Good   Psychomotor Activity  Psychomotor Activity: Psychomotor Activity: Normal   Assets  Assets: Communication Skills; Desire for Improvement; Housing; Leisure Time; Social Support   Sleep  Sleep: Sleep: Poor (States that he did sleep last night)   Nutritional Assessment (For OBS and FBC admissions only) Has the patient had a weight loss or gain of 10 pounds or more in the last 3 months?: Yes Has the patient had a decrease in food intake/or appetite?: Yes Does the patient have dental problems?: No Does the patient have eating habits or behaviors that may be indicators of an eating disorder including binging or inducing vomiting?: Yes Has the patient recently lost weight without trying?: 3 Has the patient been eating poorly because of a decreased appetite?: 1 (States he did eat well last night) Malnutrition Screening Tool Score: 4 Nutritional Assessment Referrals: Refer to Primary Care Provider    Physical Exam  Physical Exam Vitals and nursing note reviewed. Exam conducted with a chaperone present.  Constitutional:      General: He is not in acute distress.    Appearance: Normal appearance. He is not ill-appearing.  HENT:     Head: Normocephalic.  Eyes:     General:        Right eye: No discharge.  Conjunctiva/sclera: Conjunctivae normal.  Cardiovascular:     Rate and Rhythm: Tachycardia present.   Pulmonary:     Effort: Pulmonary effort is normal. No respiratory distress.  Musculoskeletal:        General: Normal range of motion.     Cervical back: Normal range of motion.  Skin:    General: Skin is warm and dry.  Neurological:     Mental Status: He is alert and oriented to person, place, and time.  Psychiatric:        Attention and Perception: He perceives auditory hallucinations. He does not perceive visual (States he is not seeing anything at this time) hallucinations.        Mood and Affect: Mood is anxious and depressed.        Speech: Speech normal.        Behavior: Behavior normal. Behavior is cooperative.        Thought Content: Thought content is not paranoid or delusional. Thought content includes suicidal ideation. Thought content does not include homicidal ideation. Thought content includes suicidal plan.        Cognition and Memory: Cognition normal.        Judgment: Judgment normal.    Review of Systems  Constitutional:  Positive for malaise/fatigue.  HENT: Negative.    Eyes: Negative.   Respiratory:  Negative for cough, shortness of breath and wheezing.   Cardiovascular:  Negative for chest pain.  Gastrointestinal:  Negative for abdominal pain, constipation, diarrhea and nausea.  Genitourinary:  Negative for dysuria.  Musculoskeletal:  Positive for back pain and myalgias.  Neurological:  Negative for tremors, seizures and headaches.  Psychiatric/Behavioral:  Positive for depression (Stable), hallucinations (Reports auditory hallucinations have improved and he is no longer seeing shadows) and suicidal ideas (Denies active suicidal thoughts at this time). Substance abuse: States that he is not drinking daily.  "I only drink about 1 or 2 beers a week"  Denies withdrawal symptoms.The patient is nervous/anxious (Stable) and has insomnia (States he was able to sleep last night and has been resting and sleeping on/off most of the day).    Blood pressure 98/65, pulse (!)  117, temperature 97.7 F (36.5 C), resp. rate 17, SpO2 100 %. There is no height or weight on file to calculate BMI.   Assessment:  Patient was informed that he had been accepted to [facility] for inpatient psychiatric treatment and feels that he doesn't need inpatient treatment.  States he is feeling better.  Decrease in auditory hallucination, no visual hallucination, no longer feeling suicidal.  States that the suicidal thoughts were related to not getting any sleep and since he has slept he is feeling better.  He also denies any GI symptoms at this time.  Patient was restarted on his medications and encouraged to take as prescribed.  Patient states he can follow up with his [facility] ACTT team.  Treatment Plan Summary:     Medication management and Plan Outpatient psychiatric services:  Follow up with [facility] ACTT team for medication management and counseling  Shanette Tamargo, NP 04/19/2022 1:54 PM

## 2022-04-19 NOTE — Progress Notes (Addendum)
Pt was accepted to [facility] 04/18/2022.  Pt meets inpatient criteria per Earleen Newport, NP  Attending Physician will be Patrick Jupiter, MD  Report can be called to: [phone]  Bed is ready now  Care Team Notified: Earleen Newport, NP, Lynnda Shields, RN, Roxy Manns, RN, and Hampton Abbot, MD  Denna Haggard, [place]  04/19/2022 11:36 AM

## 2022-04-19 NOTE — Progress Notes (Signed)
Edwin Henry refused to go to [facility] stating  the location is to far away from home. Later he received his discharge order and prescriptions. He was given his AVS and his questions answered. He retrieved his personal belongings and was escorted to the lobby where a representative was waiting from the act team.

## 2022-04-19 NOTE — ED Notes (Signed)
Patient is sleeping. Respirations equal and unlabored, skin warm and dry, NAD. No change in assessment or acuity. Routine safety checks conducted according to facility protocol. Will continue to monitor for safety.   

## 2022-04-19 NOTE — Progress Notes (Signed)
Edwin Henry woke up, ate and was comnpliant with his medication. He endorsed feeling anxious and on the verge of feeling suicidal. He returned to his chair bed and drifted back off to sleep.

## 2022-04-19 NOTE — ED Notes (Signed)
Pt is A & O x 4. Pt is oriented to the unit and provided with meal. Medication has been administered to him and pt is lying on his bed. Pt denies SI/HI/AVH. Will continue to monitor for safety.

## 2022-04-19 NOTE — Progress Notes (Signed)
LCSW Progress Note  YE:[id number]   Geremiah Smalls  04/19/2022  10:25 AM  Description:   Inpatient Psychiatric Referral  Patient was recommended inpatient per Earleen Newport, NP. There are no available beds at [facility]. Patient was referred to the following facilities:   Destination  Service Provider Address Phone Fax  [address] [place] [address] [phone]  [facility]  [address] [place] [address] [phone]  [place] [address], [place] [id number] [phone]  [facility]  [place], [place] [address] [phone] [place]  [address], [place] [address] [phone]  [facility]  [place], [place] [address] [phone]  [facility]  [address] [place] [address] [phone] [place]  [place], [place] [address] [phone] [facility]  [id number] [place] [address] [phone] [facility]  [address] [place], [place] [address] [phone] [address] [place] [address] [place]  [facility]  [address] [place] [address] [phone]  [facility]  [address] [place] [address] [phone]  [place] [address] [place] [address] [phone]  [facility]  [place] [address] [phone]  [facility]  [address], [place] [address] [phone] [facility]  [place] [address] [phone] [facility]  [address], [place] [address] [place]  [address], [place] [address] [phone]  [facility]  [address] [place] [address] [place]  [facility]  [address], [place] [address] [id number]  [facility]  [address] [place] [address] [phone] [place]  [address] [place] [address] [phone]  [address] [place] [address] [place]  [facility]  [address], [place] [address] [id number]  [facility]  [address], [place] [address] [phone]  [facility]  [place], [place] [address] [phone] [facility]  [address], [place] [address] [phone] [facility]  [place], [place] [address] [id number]    Situation ongoing, CSW to continue following and update chart as more information becomes available.      Denna Haggard, Latanya Presser  04/19/2022 10:25 AM

## 2022-04-19 NOTE — Progress Notes (Signed)
Received Edwin Henry this AM sleeping in his chair bed without distress. He was awaken to talk with the provider and immediately drifted back off to sleep.

## 2022-04-21 LAB — PROLACTIN: Prolactin: 1 ng/mL — ABNORMAL LOW (ref 3.6–25.2)

## 2022-05-15 ENCOUNTER — Ambulatory Visit (HOSPITAL_COMMUNITY)
Admission: EM | Admit: 2022-05-15 | Discharge: 2022-05-16 | Disposition: A | Discharge status: Home / Self Care | Payer: Medicaid Other | Attending: Registered Nurse | Admitting: Registered Nurse

## 2022-05-15 ENCOUNTER — Encounter (HOSPITAL_COMMUNITY): Payer: Self-pay | Admitting: Registered Nurse

## 2022-05-15 DIAGNOSIS — F17220 Nicotine dependence, chewing tobacco, uncomplicated: Secondary | ICD-10-CM | POA: Insufficient documentation

## 2022-05-15 DIAGNOSIS — Z1152 Encounter for screening for COVID-19: Secondary | ICD-10-CM | POA: Insufficient documentation

## 2022-05-15 DIAGNOSIS — Z91148 Patient's other noncompliance with medication regimen for other reason: Secondary | ICD-10-CM | POA: Insufficient documentation

## 2022-05-15 DIAGNOSIS — F251 Schizoaffective disorder, depressive type: Secondary | ICD-10-CM | POA: Diagnosis present

## 2022-05-15 DIAGNOSIS — R45851 Suicidal ideations: Secondary | ICD-10-CM | POA: Insufficient documentation

## 2022-05-15 DIAGNOSIS — G4700 Insomnia, unspecified: Secondary | ICD-10-CM | POA: Diagnosis not present

## 2022-05-15 DIAGNOSIS — Z79899 Other long term (current) drug therapy: Secondary | ICD-10-CM | POA: Insufficient documentation

## 2022-05-15 LAB — URINALYSIS, ROUTINE W REFLEX MICROSCOPIC
Bilirubin Urine: NEGATIVE
Glucose, UA: NEGATIVE mg/dL
Hgb urine dipstick: NEGATIVE
Ketones, ur: NEGATIVE mg/dL
Nitrite: NEGATIVE
Protein, ur: NEGATIVE mg/dL
Specific Gravity, Urine: 1.014 (ref 1.005–1.030)
pH: 5 (ref 5.0–8.0)

## 2022-05-15 LAB — POCT URINE DRUG SCREEN - MANUAL ENTRY (I-SCREEN)
POC Amphetamine UR: NOT DETECTED
POC Buprenorphine (BUP): NOT DETECTED
POC Cocaine UR: POSITIVE — AB
POC Marijuana UR: NOT DETECTED
POC Methadone UR: NOT DETECTED
POC Methamphetamine UR: NOT DETECTED
POC Morphine: NOT DETECTED
POC Oxazepam (BZO): NOT DETECTED
POC Oxycodone UR: NOT DETECTED
POC Secobarbital (BAR): NOT DETECTED

## 2022-05-15 LAB — COMPREHENSIVE METABOLIC PANEL
ALT: 18 U/L (ref 0–44)
AST: 17 U/L (ref 15–41)
Albumin: 4.1 g/dL (ref 3.5–5.0)
Alkaline Phosphatase: 96 U/L (ref 38–126)
Anion gap: 13 (ref 5–15)
BUN: 7 mg/dL — ABNORMAL LOW (ref 8–23)
CO2: 28 mmol/L (ref 22–32)
Calcium: 9.8 mg/dL (ref 8.9–10.3)
Chloride: 97 mmol/L — ABNORMAL LOW (ref 98–111)
Creatinine, Ser: 0.67 mg/dL (ref 0.61–1.24)
GFR, Estimated: >60 mL/min (ref >60)
Glucose, Bld: 80 mg/dL (ref 70–99)
Potassium: 4.3 mmol/L (ref 3.5–5.1)
Sodium: 138 mmol/L (ref 135–145)
Total Bilirubin: 0.8 mg/dL (ref 0.3–1.2)
Total Protein: 7.8 g/dL (ref 6.5–8.1)

## 2022-05-15 LAB — SARS CORONAVIRUS 2 BY RT PCR: SARS Coronavirus 2 by RT PCR: NEGATIVE

## 2022-05-15 LAB — CBC WITH DIFFERENTIAL/PLATELET
Abs Immature Granulocytes: 0.03 10*3/uL (ref 0.00–0.07)
Basophils Absolute: 0.1 10*3/uL (ref 0.0–0.1)
Basophils Relative: 1 %
Eosinophils Absolute: 0.1 10*3/uL (ref 0.0–0.5)
Eosinophils Relative: 1 %
HCT: 49.7 % (ref 39.0–52.0)
Hemoglobin: 16.6 g/dL (ref 13.0–17.0)
Immature Granulocytes: 0 %
Lymphocytes Relative: 25 %
Lymphs Abs: 2.7 10*3/uL (ref 0.7–4.0)
MCH: 30.3 pg (ref 26.0–34.0)
MCHC: 33.4 g/dL (ref 30.0–36.0)
MCV: 90.7 fL (ref 80.0–100.0)
Monocytes Absolute: 0.7 10*3/uL (ref 0.1–1.0)
Monocytes Relative: 7 %
Neutro Abs: 7.3 10*3/uL (ref 1.7–7.7)
Neutrophils Relative %: 66 %
Platelets: 324 10*3/uL (ref 150–400)
RBC: 5.48 MIL/uL (ref 4.22–5.81)
RDW: 13.9 % (ref 11.5–15.5)
WBC: 10.9 10*3/uL — ABNORMAL HIGH (ref 4.0–10.5)
nRBC: 0 % (ref 0.0–0.2)

## 2022-05-15 LAB — POC SARS CORONAVIRUS 2 AG: SARSCOV2ONAVIRUS 2 AG: NEGATIVE

## 2022-05-15 LAB — ETHANOL: Alcohol, Ethyl (B): <10 mg/dL (ref <10)

## 2022-05-15 MED ORDER — MIRTAZAPINE 30 MG PO TABS
30.0000 mg | ORAL_TABLET | Freq: Every day | ORAL | Status: DC
  Administered 2022-05-15: 30 mg via ORAL
  Filled 2022-05-15: qty 1

## 2022-05-15 MED ORDER — GABAPENTIN 100 MG PO CAPS
100.0000 mg | ORAL_CAPSULE | Freq: Two times a day (BID) | ORAL | Status: DC
  Administered 2022-05-15 – 2022-05-16 (×2): 100 mg via ORAL
  Filled 2022-05-15 (×2): qty 1

## 2022-05-15 MED ORDER — BENZTROPINE MESYLATE 1 MG PO TABS
1.0000 mg | ORAL_TABLET | Freq: Every day | ORAL | Status: DC
  Administered 2022-05-15: 1 mg via ORAL
  Filled 2022-05-15: qty 1

## 2022-05-15 MED ORDER — ENSURE ENLIVE PO LIQD
237.0000 mL | Freq: Three times a day (TID) | ORAL | Status: DC
  Administered 2022-05-15 – 2022-05-16 (×3): 237 mL via ORAL
  Filled 2022-05-15 (×6): qty 237

## 2022-05-15 MED ORDER — MAGNESIUM HYDROXIDE 400 MG/5ML PO SUSP: 30.0000 mL | Freq: Every day | ORAL | Status: DC | PRN

## 2022-05-15 MED ORDER — OLANZAPINE 7.5 MG PO TABS
7.5000 mg | ORAL_TABLET | Freq: Every day | ORAL | Status: DC
  Administered 2022-05-15: 7.5 mg via ORAL
  Filled 2022-05-15: qty 1

## 2022-05-15 MED ORDER — QUETIAPINE FUMARATE 400 MG PO TABS
800.0000 mg | ORAL_TABLET | Freq: Every day | ORAL | Status: DC
  Administered 2022-05-15: 800 mg via ORAL
  Filled 2022-05-15: qty 2

## 2022-05-15 MED ORDER — ALUM & MAG HYDROXIDE-SIMETH 200-200-20 MG/5ML PO SUSP: 30.0000 mL | ORAL | Status: DC | PRN

## 2022-05-15 MED ORDER — ACETAMINOPHEN 325 MG PO TABS: 650.0000 mg | ORAL_TABLET | Freq: Four times a day (QID) | ORAL | Status: DC | PRN

## 2022-05-15 MED ORDER — HYDROXYZINE HCL 25 MG PO TABS
25.0000 mg | ORAL_TABLET | Freq: Three times a day (TID) | ORAL | Status: DC | PRN
  Administered 2022-05-15: 25 mg via ORAL
  Filled 2022-05-15: qty 1

## 2022-05-15 MED ORDER — TRAZODONE HCL 50 MG PO TABS: 50.0000 mg | ORAL_TABLET | Freq: Every evening | ORAL | Status: DC | PRN

## 2022-05-15 NOTE — ED Provider Notes (Signed)
[facility] H&P  Date: 05/15/22 Patient Name: Edwin Henry MRN: [id number] Chief Complaint: Depression, insomnia, running out of medications early  Diagnoses:  Final diagnoses:  Schizoaffective disorder, depressive type  Insomnia, unspecified type    HPI: Edwin Henry 62 y.o., male [facility] after presenting as walk in accompanied by his [place] [facility] therapist Claiborne BillingsJolenta Mckings  [phone] with  complaints of depression, auditory/visual hallucinations, insomnia, and running out of his medications early.  Patient seen face to face by this provider, consulted with Dr. Nelly RoutArchana Kumar; and chart reviewed on 05/15/22.  On evaluation Edwin Henry reports he has been doubling up on his medications related to not being able to sleep.  Patient states that he is unable to sleep unless he double up on his medications.  States when he takes double that his hallucinations are also controlled.  States that his psychiatric is aware that he has double up on medications and has ran out 10 days early.  Patient states last heard voices this morning telling him to kill himself.  Last visual hallucination was a couple days ago when he saw snakes in room.  "I knew they weren't really there though."  Patient wanting to be restarted on medications.    During evaluation Edwin Henry is sitting in chair with no noted distress.  He is alert/oriented x 4, calm, cooperative, and attentive.  His responses were appropriate to assessment questions.  His mood is depressed and anxious with congruent affect.  He spoke in a clear tone at moderate volume, and normal pace, with good eye contact.   He currently denies homicidal ideation, psychosis, and paranoia.  He is endorsing passive suicidal ideation with no intent or plan.  Objectively:  there is no evidence of psychosis/mania or delusional thinking other than patients' endorsement.  He conversed  coherently, with goal directed thoughts, and no distractibility, or pre-occupation.   Patient educated on the dangers of overmedicating on his prescribed medications.  Encouraged to speak with his psychiatrist so that changes could be made.    Total Time spent with patient: 45 minutes  Musculoskeletal  Strength & Muscle Tone: within normal limits Gait & Station: normal Patient leans: N/A  Psychiatric Specialty Exam  Presentation General Appearance:  Appropriate for Environment  Eye Contact: Good  Speech: Clear and Coherent; Normal Rate  Speech Volume: Normal  Handedness: Right   Mood and Affect  Mood: Dysphoric; Anxious  Affect: Appropriate; Congruent   Thought Process  Thought Processes: Coherent; Goal Directed  Descriptions of Associations:Intact  Orientation:Full (Time, Place and Person)  Thought Content:WDL; Logical  Diagnosis of Schizophrenia or Schizoaffective disorder in past: Yes  Duration of Psychotic Symptoms: Greater than six months  Hallucinations:Hallucinations: Auditory; Visual Description of Auditory Hallucinations: voices telll "kill self" Description of Visual Hallucinations: seeing "shadows"   ,  saw snakes a couple days ago  Ideas of Reference:None  Suicidal Thoughts:Suicidal Thoughts: Yes, Passive (suicdal thoughts "a couple days ago") SI Passive Intent and/or Plan: Without Intent; Without Plan  Homicidal Thoughts:Homicidal Thoughts: No   Sensorium  Memory: Immediate Good; Recent Good; Remote Good  Judgment: Fair (patient dounbling up prescribed medication)  Insight: Fair; Present   Executive Functions  Concentration: Good  Attention Span: Good  Recall: Good  Fund of Knowledge: Good  Language: Good   Psychomotor Activity  Psychomotor Activity: Psychomotor Activity: Normal   Assets  Assets: Communication Skills; Desire for Improvement; Financial Resources/Insurance; Housing; Physical Health; Resilience;  Transportation;  Social Support   Sleep  Sleep: Sleep: Poor Number of Hours of Sleep: 3   Nutritional Assessment (For OBS and FBC admissions only) Has the patient had a weight loss or gain of 10 pounds or more in the last 3 months?: Yes Has the patient had a decrease in food intake/or appetite?: Yes Does the patient have dental problems?: No Does the patient have eating habits or behaviors that may be indicators of an eating disorder including binging or inducing vomiting?: No Has the patient recently lost weight without trying?: 3 Has the patient been eating poorly because of a decreased appetite?: 1 Malnutrition Screening Tool Score: 4 Nutritional Assessment Referrals: Refer to Primary Care Provider (psychotrophicmedicatiion change  , nutritional supplement started)    Physical Exam Vitals and nursing note reviewed.  Constitutional:      General: He is not in acute distress.    Appearance: Normal appearance. He is not ill-appearing.  HENT:     Head: Normocephalic.  Eyes:     Conjunctiva/sclera: Conjunctivae normal.  Cardiovascular:     Rate and Rhythm: Normal rate.  Pulmonary:     Effort: Pulmonary effort is normal.  Musculoskeletal:        General: Normal range of motion.     Cervical back: Normal range of motion.  Skin:    General: Skin is warm and dry.  Neurological:     Mental Status: He is alert and oriented to person, place, and time.  Psychiatric:        Attention and Perception: Attention and perception normal. Auditory hallucinations: Reporting auditory hallucinations this morning but not currently.  ng. Visual hallucinations: Reported visual hallucination a week ago.        Mood and Affect: Mood is anxious and depressed.        Speech: Speech normal.        Behavior: Behavior normal. Behavior is cooperative.        Thought Content: Suicidal: Passive but no intent or plan. Thought content does not include suicidal plan.        Cognition and Memory: Cognition  normal.        Judgment: Judgment is impulsive.    Review of Systems  Constitutional:  Positive for weight loss.  Gastrointestinal:        Decreased appetite   Psychiatric/Behavioral:  Positive for depression and suicidal ideas (Passive with no intent or plan). Hallucinations: Denies at this time but states heard voices earlier this morning telling him to kill himself.  and visual hallucinations of snakes couple days ago..The patient is nervous/anxious and has insomnia (States he is only getting 2-3 hours of sleep at night and that is only when he doubles up on his medications).        Patient stating when he is doubling up on medications he has no hallucinations and he is able to sleep through the night.  States he ran out of medication Friday which is 10 earlier than he should have.   All other systems reviewed and are negative.   Blood pressure (!) 137/96, pulse 99, temperature 98.4 F (36.9 C), temperature source Oral, resp. rate 18, SpO2 97 %. There is no height or weight on file to calculate BMI.  Past Psychiatric History: Cocaine use disorder, mild, abuse , Cannabis use disorder, induced, Schizoaffective disorder, depressive type    Is the patient at risk to self? No  Has the patient been a risk to self in the past 6 months? No .  Has the patient been a risk to self within the distant past? No   Is the patient a risk to others? No   Has the patient been a risk to others in the past 6 months? No   Has the patient been a risk to others within the distant past? No   Past Medical History:  Past Medical History:  Diagnosis Date   Hypertension    Insomnia disorder    Schizophrenia      Family History: None reported  Social History:  Social History   Tobacco Use   Smoking status: Former    Types: Cigars, Cigarettes    Quit date: 11/07/2019    Years since quitting: 2.5   Smokeless tobacco: Current    Types: Chew  Vaping Use   Vaping Use: Never used  Substance Use  Topics   Alcohol use: Yes    Comment: 1-2 beers every 2 days   Drug use: Yes    Types: Marijuana, Cocaine    Comment: occasional THC use, 12/15/20 not now     Last Labs:  Admission on 05/15/2022  Component Date Value Ref Range Status   SARSCOV2ONAVIRUS 2 AG 05/15/2022 NEGATIVE  NEGATIVE Final   Comment: (NOTE) SARS-CoV-2 antigen NOT DETECTED.   Negative results are presumptive.  Negative results do not preclude SARS-CoV-2 infection and should not be used as the sole basis for treatment or other patient management decisions, including infection  control decisions, particularly in the presence of clinical signs and  symptoms consistent with COVID-19, or in those who have been in contact with the virus.  Negative results must be combined with clinical observations, patient history, and epidemiological information. The expected result is Negative.  Fact Sheet for Patients: https://www.jennings-kim.com/  Fact Sheet for Healthcare Providers: https://alexander-rogers.biz/  This test is not yet approved or cleared by the [place] FDA and  has been authorized for detection and/or diagnosis of SARS-CoV-2 by FDA under an Emergency Use Authorization (EUA).  This EUA will remain in effect (meaning this test can be used) for the duration of  the COV                          ID-19 declaration under Section 564(b)(1) of the Act, 21 U.S.C. section 360bbb-3(b)(1), unless the authorization is terminated or revoked sooner.    Admission on 04/18/2022, Discharged on 04/19/2022  Component Date Value Ref Range Status   SARS Coronavirus 2 by RT PCR 04/18/2022 NEGATIVE  NEGATIVE Final   Influenza A by PCR 04/18/2022 NEGATIVE  NEGATIVE Final   Influenza B by PCR 04/18/2022 NEGATIVE  NEGATIVE Final   Comment: (NOTE) The Xpert Xpress SARS-CoV-2/FLU/RSV plus assay is intended as an aid in the diagnosis of influenza from Nasopharyngeal swab specimens and should not be used  as a sole basis for treatment. Nasal washings and aspirates are unacceptable for Xpert Xpress SARS-CoV-2/FLU/RSV testing.  Fact Sheet for Patients: BloggerCourse.com  Fact Sheet for Healthcare Providers: SeriousBroker.it  This test is not yet approved or cleared by the [place] FDA and has been authorized for detection and/or diagnosis of SARS-CoV-2 by FDA under an Emergency Use Authorization (EUA). This EUA will remain in effect (meaning this test can be used) for the duration of the COVID-19 declaration under Section 564(b)(1) of the Act, 21 U.S.C. section 360bbb-3(b)(1), unless the authorization is terminated or revoked.     Resp Syncytial Virus by PCR 04/18/2022 NEGATIVE  NEGATIVE Final   Comment: (  NOTE) Fact Sheet for Patients: BloggerCourse.com  Fact Sheet for Healthcare Providers: SeriousBroker.it  This test is not yet approved or cleared by the [place] FDA and has been authorized for detection and/or diagnosis of SARS-CoV-2 by FDA under an Emergency Use Authorization (EUA). This EUA will remain in effect (meaning this test can be used) for the duration of the COVID-19 declaration under Section 564(b)(1) of the Act, 21 U.S.C. section 360bbb-3(b)(1), unless the authorization is terminated or revoked.  Performed at [facility], [address] [place], [place] [address]    WBC 04/18/2022 7.3  4.0 - 10.5 K/uL Final   RBC 04/18/2022 5.48  4.22 - 5.81 MIL/uL Final   Hemoglobin 04/18/2022 16.9  13.0 - 17.0 g/dL Final   HCT 57/84/6962 48.4  39.0 - 52.0 % Final   MCV 04/18/2022 88.3  80.0 - 100.0 fL Final   MCH 04/18/2022 30.8  26.0 - 34.0 pg Final   MCHC 04/18/2022 34.9  30.0 - 36.0 g/dL Final   RDW 95/28/4132 13.7  11.5 - 15.5 % Final   Platelets 04/18/2022 312  150 - 400 K/uL Final   nRBC 04/18/2022 0.0  0.0 - 0.2 % Final   Neutrophils Relative %  04/18/2022 48  % Final   Neutro Abs 04/18/2022 3.5  1.7 - 7.7 K/uL Final   Lymphocytes Relative 04/18/2022 37  % Final   Lymphs Abs 04/18/2022 2.7  0.7 - 4.0 K/uL Final   Monocytes Relative 04/18/2022 12  % Final   Monocytes Absolute 04/18/2022 0.9  0.1 - 1.0 K/uL Final   Eosinophils Relative 04/18/2022 2  % Final   Eosinophils Absolute 04/18/2022 0.1  0.0 - 0.5 K/uL Final   Basophils Relative 04/18/2022 1  % Final   Basophils Absolute 04/18/2022 0.1  0.0 - 0.1 K/uL Final   Immature Granulocytes 04/18/2022 0  % Final   Abs Immature Granulocytes 04/18/2022 0.02  0.00 - 0.07 K/uL Final   Performed at [facility], [address] [place], [place] [address]   Sodium 04/18/2022 137  135 - 145 mmol/L Final   Potassium 04/18/2022 4.3  3.5 - 5.1 mmol/L Final   Chloride 04/18/2022 96 (L)  98 - 111 mmol/L Final   CO2 04/18/2022 26  22 - 32 mmol/L Final   Glucose, Bld 04/18/2022 79  70 - 99 mg/dL Final   Glucose reference range applies only to samples taken after fasting for at least 8 hours.   BUN 04/18/2022 13  8 - 23 mg/dL Final   Creatinine, Ser 04/18/2022 0.83  0.61 - 1.24 mg/dL Final   Calcium 27/25/3664 9.9  8.9 - 10.3 mg/dL Final   Total Protein 40/34/7425 8.2 (H)  6.5 - 8.1 g/dL Final   Albumin 95/63/8756 4.2  3.5 - 5.0 g/dL Final   AST 43/32/9518 21  15 - 41 U/L Final   ALT 04/18/2022 20  0 - 44 U/L Final   Alkaline Phosphatase 04/18/2022 111  38 - 126 U/L Final   Total Bilirubin 04/18/2022 0.6  0.3 - 1.2 mg/dL Final   GFR, Estimated 04/18/2022 >60  >60 mL/min Final   Comment: (NOTE) Calculated using the CKD-EPI Creatinine Equation (2021)    Anion gap 04/18/2022 15  5 - 15 Final   Performed at [facility], [address], [place], [place] [address]   Hgb A1c MFr Bld 04/18/2022 5.8 (H)  4.8 - 5.6 % Final   Comment: (NOTE)  Prediabetes: 5.7 - 6.4         Diabetes: >6.4         Glycemic control for adults with diabetes: <7.0    Mean Plasma Glucose 04/18/2022  120  mg/dL Final   Comment: (NOTE) Performed At: [facility] [address] [place], [place] [id number] Jolene Schimke MD VP:[id number]    Magnesium 04/18/2022 2.3  1.7 - 2.4 mg/dL Final   Performed at [facility], [address], [place], [place] [address]   Alcohol, Ethyl (B) 04/18/2022 86 (H)  <10 mg/dL Final   Comment: (NOTE) Lowest detectable limit for serum alcohol is 10 mg/dL.  For medical purposes only. Performed at [facility], [address], [place], [place] [address]    Cholesterol 04/18/2022 217 (H)  0 - 200 mg/dL Final   Triglycerides 38/18/2993 221 (H)  <150 mg/dL Final   HDL 71/69/6789 42  >40 mg/dL Final   Total CHOL/HDL Ratio 04/18/2022 5.2  RATIO Final   VLDL 04/18/2022 44 (H)  0 - 40 mg/dL Final   LDL Cholesterol 04/18/2022 131 (H)  0 - 99 mg/dL Final   Comment:        Total Cholesterol/HDL:CHD Risk Coronary Heart Disease Risk Table                     Men   Women  1/2 Average Risk   3.4   3.3  Average Risk       5.0   4.4  2 X Average Risk   9.6   7.1  3 X Average Risk  23.4   11.0        Use the calculated Patient Ratio above and the CHD Risk Table to determine the patient's CHD Risk.        ATP III CLASSIFICATION (LDL):  <100     mg/dL   Optimal  381-017  mg/dL   Near or Above                    Optimal  130-159  mg/dL   Borderline  510-258  mg/dL   High  >527     mg/dL   Very High Performed at [facility], [address] [place], [place] [address]    TSH 04/18/2022 1.315  0.350 - 4.500 uIU/mL Final   Comment: Performed by a 3rd Generation assay with a functional sensitivity of <=0.01 uIU/mL. Performed at [facility], [address] [place], [place] [address]    Prolactin 04/18/2022 1.0 (L)  3.6 - 25.2 ng/mL Final   Comment: (NOTE) Performed At: [facility] [address] [place], [place] [id number] Jolene Schimke MD QP:[id number]    Color, Urine 04/18/2022 YELLOW  YELLOW Final   APPearance  04/18/2022 CLEAR  CLEAR Final   Specific Gravity, Urine 04/18/2022 1.010  1.005 - 1.030 Final   pH 04/18/2022 5.0  5.0 - 8.0 Final   Glucose, UA 04/18/2022 NEGATIVE  NEGATIVE mg/dL Final   Hgb urine dipstick 04/18/2022 MODERATE (A)  NEGATIVE Final   Bilirubin Urine 04/18/2022 NEGATIVE  NEGATIVE Final   Ketones, ur 04/18/2022 NEGATIVE  NEGATIVE mg/dL Final   Protein, ur 12/45/8099 NEGATIVE  NEGATIVE mg/dL Final   Nitrite 83/38/2505 NEGATIVE  NEGATIVE Final   Leukocytes,Ua 04/18/2022 NEGATIVE  NEGATIVE Final   RBC / HPF 04/18/2022 0-5  0 - 5 RBC/hpf Final   WBC, UA 04/18/2022 0-5  0 - 5 WBC/hpf Final  Bacteria, UA 04/18/2022 NONE SEEN  NONE SEEN Final   Squamous Epithelial / HPF 04/18/2022 0-5  0 - 5 /HPF Final   Mucus 04/18/2022 PRESENT   Final   Performed at [facility], [address] [place], [place] [address]   POC Amphetamine UR 04/18/2022 None Detected  NONE DETECTED (Cut Off Level 1000 ng/mL) Final   POC Secobarbital (BAR) 04/18/2022 None Detected  NONE DETECTED (Cut Off Level 300 ng/mL) Final   POC Buprenorphine (BUP) 04/18/2022 None Detected  NONE DETECTED (Cut Off Level 10 ng/mL) Final   POC Oxazepam (BZO) 04/18/2022 None Detected  NONE DETECTED (Cut Off Level 300 ng/mL) Final   POC Cocaine UR 04/18/2022 None Detected  NONE DETECTED (Cut Off Level 300 ng/mL) Final   POC Methamphetamine UR 04/18/2022 None Detected  NONE DETECTED (Cut Off Level 1000 ng/mL) Final   POC Morphine 04/18/2022 None Detected  NONE DETECTED (Cut Off Level 300 ng/mL) Final   POC Methadone UR 04/18/2022 None Detected  NONE DETECTED (Cut Off Level 300 ng/mL) Final   POC Oxycodone UR 04/18/2022 None Detected  NONE DETECTED (Cut Off Level 100 ng/mL) Final   POC Marijuana UR 04/18/2022 None Detected  NONE DETECTED (Cut Off Level 50 ng/mL) Final    Allergies: Patient has no known allergies.  Medications:  Facility Ordered Medications  Medication   acetaminophen (TYLENOL) tablet 650 mg   alum & mag  hydroxide-simeth (MAALOX/MYLANTA) 200-200-20 MG/5ML suspension 30 mL   magnesium hydroxide (MILK OF MAGNESIA) suspension 30 mL   hydrOXYzine (ATARAX) tablet 25 mg   traZODone (DESYREL) tablet 50 mg   feeding supplement (ENSURE ENLIVE / ENSURE PLUS) liquid 237 mL   gabapentin (NEURONTIN) capsule 100 mg   mirtazapine (REMERON) tablet 30 mg   benztropine (COGENTIN) tablet 1 mg   OLANZapine (ZYPREXA) tablet 7.5 mg   QUEtiapine (SEROQUEL) tablet 800 mg   PTA Medications  Medication Sig   gabapentin (NEURONTIN) 100 MG capsule Take 100 mg by mouth 2 (two) times daily.    Medical Decision Making  Armarion Greek was admitted to [facility]  continuous assessment unit for Schizoaffective disorder, depressive type, crisis management, and stabilization. Routine labs ordered, which include Lab Orders         SARS Coronavirus 2 by RT PCR (hospital order, performed in [facility] hospital lab) *cepheid single result test*         CBC with Differential/Platelet         Comprehensive metabolic panel         Ethanol         Urinalysis, Routine w reflex microscopic -Urine, Clean Catch         POCT Urine Drug Screen - (I-Screen)         POC SARS Coronavirus 2 Ag    Medication Management: Medications started Meds ordered this encounter  Medications   acetaminophen (TYLENOL) tablet 650 mg   alum & mag hydroxide-simeth (MAALOX/MYLANTA) 200-200-20 MG/5ML suspension 30 mL   magnesium hydroxide (MILK OF MAGNESIA) suspension 30 mL   hydrOXYzine (ATARAX) tablet 25 mg   traZODone (DESYREL) tablet 50 mg   feeding supplement (ENSURE ENLIVE / ENSURE PLUS) liquid 237 mL   gabapentin (NEURONTIN) capsule 100 mg   mirtazapine (REMERON) tablet 30 mg   benztropine (COGENTIN) tablet 1 mg   OLANZapine (ZYPREXA) tablet 7.5 mg   QUEtiapine (SEROQUEL) tablet 800 mg    Will maintain observation checks every 15 minutes for safety. Psychosocial education  regarding relapse prevention and  self-care; social and communication  Social work will consult with family for collateral information and discuss discharge and follow up plan.    Recommendations  Based on my evaluation the patient does not appear to have an emergency medical condition.  Cyara Devoto, NP 05/15/22  3:11 PM

## 2022-05-15 NOTE — Progress Notes (Signed)
   05/15/22 1020  [facility] Triage Screening (Walk-ins at [facility] only)  How Did You Hear About [place]? Other (Comment) (ACTT staff, Ranee Gosselin, with [employer].)  What Is the Reason for Your Visit/Call Today? Patient presents with his [facility] ACTT therapist for evaluation, as he continues to deal with worsening AVH, with voices now screaming/yelling and telling him to "walk in front of a car.  Cut your wrists."  Patient is calm, cooperative and pleasant.  He has only been sleeping 4 hours per night and has lost 30 lbs in 1.5 months.  With the sleep issues and worsening psychosis, patient admits he has doubled up on his Rx meds, which he is now out of 2 wks early.   He is reporting SI, however he denies having a plan.  He states he feels "just hopeless."  He gave consent for clinician to speak with his ACTT RN, [place], by speaker phone who confirms they have tried to adjust medications 3 times in the last 60 days and they are seeing minimal improvement.  [place] is also concerned that patient has been doubling med doses.  He states they plan to change how they administer with ACTT staff. Patient denies HI or SA hx.  How Long Has This Been Causing You Problems? 1-6 months  Have You Recently Had Any Thoughts About Hurting Yourself? Yes  How long ago did you have thoughts about hurting yourself? SI with AH, voices telling him to walk in front of a car or cut his wrists.  Are You Planning to Commit Suicide/Harm Yourself At This time? No  Have you Recently Had Thoughts About Hurting Someone [name]? No  Are You Planning To Harm Someone At This Time? No  Are you currently experiencing any auditory, visual or other hallucinations? Yes  Please explain the hallucinations you are currently experiencing: AH - worsening AH, with voices now screaming/yelling - male/male voices, telling him to kill himself by walking in front of a car or cutting his wrists.  Have You Used Any Alcohol or Drugs in the Past 24 Hours? No  How long ago did you  use Drugs or Alcohol? N/A  What Did You Use and How Much? N/A  Do you have any current medical co-morbidities that require immediate attention? No  Clinician description of patient physical appearance/behavior: Patient is calm, cooperative, with flat affect, AAOx5  What Do You Feel Would Help You the Most Today? Treatment for Depression or other mood problem  If access to [facility] was not available, would you have sought care in the Emergency Department? Yes  Determination of Need Emergent (2 hours)  Options For Referral Inpatient Hospitalization

## 2022-05-15 NOTE — ED Notes (Signed)
Pt sleeping at present, no distress noted.  Monitoring for safety. 

## 2022-05-15 NOTE — ED Notes (Signed)
Pt laying in bed calm and cooperative. No c/o pain or distress.will continue to monitor for safety 
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
## 2022-05-16 MED ORDER — METOPROLOL TARTRATE 25 MG PO TABS
12.5000 mg | ORAL_TABLET | Freq: Once | ORAL | Status: AC
  Administered 2022-05-16: 12.5 mg via ORAL
  Filled 2022-05-16: qty 1

## 2022-05-16 MED ORDER — ENSURE ENLIVE PO LIQD: 237.0000 mL | Freq: Three times a day (TID) | ORAL | 12 refills | Status: AC

## 2022-05-16 MED ORDER — QUETIAPINE FUMARATE 400 MG PO TABS: 800.0000 mg | ORAL_TABLET | Freq: Every day | ORAL | 0 refills | Status: AC

## 2022-05-16 MED ORDER — OLANZAPINE 7.5 MG PO TABS: 7.5000 mg | ORAL_TABLET | Freq: Every day | ORAL | 0 refills | Status: DC

## 2022-05-16 NOTE — ED Provider Notes (Signed)
FBC/OBS ASAP Discharge Summary  Date and Time: 05/16/2022 8:58 AM  Name: Edwin Henry  MRN:  [id number]   Discharge Diagnoses:  Final diagnoses:  Schizoaffective disorder, depressive type  Insomnia, unspecified type    Subjective: Patient states "I am ready to go home now."  Patient is reassessed by this nurse practitioner, face-to-face.  He is reclined in observation area, appears asleep.  He is alert and oriented, pleasant and cooperative during assessment.  He presents with euthymic mood, congruent affect.  Patient endorses chronic visual hallucinations.  He states "I see shadows but I have for years."  Edwin Henry will discuss updated medications with his ACT team.  Meets with ACT team several times per week.  Edwin Henry denies suicidal and homicidal ideation.  He easily contracts verbally for safety with this Clinical research associatewriter. He denies auditory hallucinations.  Denies command hallucinations.  There is no evidence of delusional thought content and no indication that patient is responding to internal stimuli.  Patient [place] in [place], denies access to weapons. He receives disability income. He endorses rare alcohol, typically consumes less than one 12 ounce beer each week.  Patient offered support and encouragement.  He gives verbal consent to speak with ACT team members through [facility].  Spoke with [facility] ACT team RN, [place].  ACT team met with patient on yesterday.  Patient continually uses more medications than prescribed.  ACT team RN will assist patient in speaking with attending psychiatrist, Dr. Edwena Feltyrounce Peirce regarding increased medications.  [facility] later this date.  Stay Summary 05/15/2022-1258pm: HPI: Edwin Henry 62 y.o., male [facility] after presenting as walk in accompanied by his [facility] therapist Claiborne BillingsJolenta Henry  [phone] with  complaints of depression, auditory/visual  hallucinations, insomnia, and running out of his medications early.  Patient seen face to face by this provider, consulted with Dr. Nelly RoutArchana Kumar; and chart reviewed on 05/15/22.  On evaluation Edwin HackerBilly Henry reports he has been doubling up on his medications related to not being able to sleep.  Patient states that he is unable to sleep unless he double up on his medications.  States when he takes double that his hallucinations are also controlled.  States that his psychiatric is aware that he has double up on medications and has ran out 10 days early.  Patient states last heard voices this morning telling him to kill himself.  Last visual hallucination was a couple days ago when he saw snakes in room.  "I knew they weren't really there though."  Patient wanting to be restarted on medications.     During evaluation Edwin Henry is sitting in chair with no noted distress.  He is alert/oriented x 4, calm, cooperative, and attentive.  His responses were appropriate to assessment questions.  His mood is depressed and anxious with congruent affect.  He spoke in a clear tone at moderate volume, and normal pace, with good eye contact.   He currently denies homicidal ideation, psychosis, and paranoia.  He is endorsing passive suicidal ideation with no intent or plan.  Objectively:  there is no evidence of psychosis/mania or delusional thinking other than patients' endorsement.  He conversed coherently, with goal directed thoughts, and no distractibility, or pre-occupation.   Patient educated on the dangers of overmedicating on his prescribed medications.  Encouraged to speak with his psychiatrist so that changes could be made.    Total Time spent with patient: 30 minutes  Past Psychiatric  History: see above Past Medical History: see H&P Family History: none reported Family Psychiatric History: none reported Social History: rare alcohol use, resides alone Tobacco Cessation:  A prescription for an FDA-approved  tobacco cessation medication was offered at discharge and the patient refused  Current Medications:  Current Facility-Administered Medications  Medication Dose Route Frequency Provider Last Rate Last Admin   acetaminophen (TYLENOL) tablet 650 mg  650 mg Oral Q6H PRN Rankin, Shuvon B, NP       alum & mag hydroxide-simeth (MAALOX/MYLANTA) 200-200-20 MG/5ML suspension 30 mL  30 mL Oral Q4H PRN Rankin, Shuvon B, NP       benztropine (COGENTIN) tablet 1 mg  1 mg Oral QHS Rankin, Shuvon B, NP   1 mg at 05/15/22 2121   feeding supplement (ENSURE ENLIVE / ENSURE PLUS) liquid 237 mL  237 mL Oral TID BM Rankin, Shuvon B, NP   237 mL at 05/15/22 1951   gabapentin (NEURONTIN) capsule 100 mg  100 mg Oral BID Rankin, Shuvon B, NP   100 mg at 05/15/22 2121   hydrOXYzine (ATARAX) tablet 25 mg  25 mg Oral TID PRN Rankin, Shuvon B, NP   25 mg at 05/15/22 2121   magnesium hydroxide (MILK OF MAGNESIA) suspension 30 mL  30 mL Oral Daily PRN Rankin, Shuvon B, NP       mirtazapine (REMERON) tablet 30 mg  30 mg Oral QHS Rankin, Shuvon B, NP   30 mg at 05/15/22 2121   OLANZapine (ZYPREXA) tablet 7.5 mg  7.5 mg Oral QHS Rankin, Shuvon B, NP   7.5 mg at 05/15/22 2121   QUEtiapine (SEROQUEL) tablet 800 mg  800 mg Oral QHS Rankin, Shuvon B, NP   800 mg at 05/15/22 2121   traZODone (DESYREL) tablet 50 mg  50 mg Oral QHS PRN Rankin, Shuvon B, NP       Current Outpatient Medications  Medication Sig Dispense Refill   benztropine (COGENTIN) 1 MG tablet Take 1 mg by mouth at bedtime.     gabapentin (NEURONTIN) 100 MG capsule Take 100 mg by mouth 2 (two) times daily.     Melatonin 10 MG TABS Take 10 mg by mouth at bedtime.     mirtazapine (REMERON) 30 MG tablet Take 30 mg by mouth at bedtime.     OLANZapine (ZYPREXA) 5 MG tablet Take 5 mg by mouth at bedtime.     QUEtiapine (SEROQUEL) 400 MG tablet Take 400 mg by mouth at bedtime.      PTA Medications:  Facility Ordered Medications  Medication   acetaminophen (TYLENOL)  tablet 650 mg   alum & mag hydroxide-simeth (MAALOX/MYLANTA) 200-200-20 MG/5ML suspension 30 mL   magnesium hydroxide (MILK OF MAGNESIA) suspension 30 mL   hydrOXYzine (ATARAX) tablet 25 mg   traZODone (DESYREL) tablet 50 mg   feeding supplement (ENSURE ENLIVE / ENSURE PLUS) liquid 237 mL   gabapentin (NEURONTIN) capsule 100 mg   mirtazapine (REMERON) tablet 30 mg   benztropine (COGENTIN) tablet 1 mg   OLANZapine (ZYPREXA) tablet 7.5 mg   QUEtiapine (SEROQUEL) tablet 800 mg   [COMPLETED] metoprolol tartrate (LOPRESSOR) tablet 12.5 mg        No data to display          Flowsheet Row ED from 05/15/[facility] from 04/18/2022 in [facility] (Discharged[facility]  C-SSRS RISK CATEGORY High Risk No Risk Low Risk  Musculoskeletal  Strength & Muscle Tone: within normal limits Gait & Station: normal Patient leans: N/A  Psychiatric Specialty Exam  Presentation  General Appearance:  Appropriate for Environment; Casual  Eye Contact: Good  Speech: Clear and Coherent; Normal Rate  Speech Volume: Normal  Handedness: Right   Mood and Affect  Mood: Euthymic  Affect: Appropriate; Congruent   Thought Process  Thought Processes: Coherent; Goal Directed; Linear  Descriptions of Associations:Intact  Orientation:Full (Time, Place and Person)  Thought Content:Logical; WDL  Diagnosis of Schizophrenia or Schizoaffective disorder in past: Yes  Duration of Psychotic Symptoms: Greater than six months   Hallucinations:Hallucinations: Visual Description of Auditory Hallucinations: voices telll "kill self" Description of Visual Hallucinations: "I always see shadows"  Ideas of Reference:None  Suicidal Thoughts:Suicidal Thoughts: No SI Passive Intent and/or Plan: Without Intent; Without Plan  Homicidal Thoughts:Homicidal Thoughts:  No   Sensorium  Memory: Immediate Good; Recent Fair  Judgment: Fair  Insight: Present   Executive Functions  Concentration: Good  Attention Span: Good  Recall: Good  Fund of Knowledge: Fair  Language: Fair   Psychomotor Activity  Psychomotor Activity: Psychomotor Activity: Normal   Assets  Assets: Communication Skills; Desire for Improvement; Housing; Social Support; Resilience; Physical Health   Sleep  Sleep: Sleep: Fair Number of Hours of Sleep: 3   Nutritional Assessment (For OBS and FBC admissions only) Has the patient had a weight loss or gain of 10 pounds or more in the last 3 months?: Yes Has the patient had a decrease in food intake/or appetite?: Yes Does the patient have dental problems?: No Does the patient have eating habits or behaviors that may be indicators of an eating disorder including binging or inducing vomiting?: No Has the patient recently lost weight without trying?: 3 Has the patient been eating poorly because of a decreased appetite?: 1 Malnutrition Screening Tool Score: 4 Nutritional Assessment Referrals: Refer to Primary Care Provider (psychotrophicmedicatiion change  , nutritional supplement started)    Physical Exam  Physical Exam Vitals and nursing note reviewed.  Constitutional:      Appearance: Normal appearance. He is well-developed and normal weight.  HENT:     Head: Normocephalic and atraumatic.     Nose: Nose normal.  Cardiovascular:     Rate and Rhythm: Normal rate.  Pulmonary:     Effort: Pulmonary effort is normal.  Musculoskeletal:        General: Normal range of motion.     Cervical back: Normal range of motion.  Skin:    General: Skin is warm.  Neurological:     Mental Status: He is alert and oriented to person, place, and time.  Psychiatric:        Attention and Perception: Attention and perception normal.        Mood and Affect: Mood and affect normal.        Speech: Speech normal.         Behavior: Behavior normal. Behavior is cooperative.        Thought Content: Thought content normal.        Cognition and Memory: Cognition and memory normal.    Review of Systems  Constitutional: Negative.   HENT: Negative.    Eyes: Negative.   Respiratory: Negative.    Cardiovascular: Negative.   Gastrointestinal: Negative.   Genitourinary: Negative.   Musculoskeletal: Negative.   Skin: Negative.   Neurological: Negative.   Psychiatric/Behavioral: Negative.     Blood pressure 96/74, pulse 76, temperature 98.3 F (36.8 C), temperature  source Oral, resp. rate 18, SpO2 100 %. There is no height or weight on file to calculate BMI.  Demographic Factors:  Male and Unemployed  Loss Factors: NA  Historical Factors: NA  Risk Reduction Factors:   Positive social support, Positive therapeutic relationship, and Positive coping skills or problem solving skills  Continued Clinical Symptoms:  Previous Psychiatric Diagnoses and Treatments  Cognitive Features That Contribute To Risk:  None    Suicide Risk:  Minimal: No identifiable suicidal ideation.  Patients presenting with no risk factors but with morbid ruminations; may be classified as minimal risk based on the severity of the depressive symptoms  Plan Of Care/Follow-up recommendations:  Follow-up with established outpatient psychiatry, [facility] ACT team. Medications: -Benztropine 1 mg nightly -Feeding supplement 237 mL 3 times daily between meals -Gabapentin 100 mg twice daily -Melatonin 10 mg nightly -Mirtazapine 30 mg nightly -Olanzapine 7.5 mg nightly -Quetiapine 800 mg nightly  Disposition: Discharge  Lenard Lance, FNP 05/16/2022, 8:58 AM

## 2022-05-16 NOTE — ED Notes (Signed)
Pt awake & resting at present, no distress noted.  Monitoring for safety. 

## 2022-05-16 NOTE — ED Provider Notes (Signed)
Writer notify by nursing staff that patient was having increase heart rate and none life threatening reaction to his medications.  Face to face assessment was performed,  patient state whenever he take his cogentin he get this feeling.  Pt denies any chest pain or SOB.  Vital sign show heart rate of BP WLN,  however heart rate was tachy at 121. New order for metoprolol  12.5 mg,  discuss with patient to stay in bed to decrease any falls.  Nursing staff to monitor vital in 30-45 minute.

## 2022-05-16 NOTE — Discharge Instructions (Addendum)

## 2022-05-16 NOTE — ED Notes (Signed)
Pt with dizziness and weakness when attempting use the restroom.  NP Sindy [place] notified, at bedside to eval pt.  Vital signs obtained.  Med given.  Pt resting at present, no LOC Noted.  Skin color good, respirations even & unlabored.  No distress noted.

## 2022-05-16 NOTE — ED Notes (Signed)
Pt reports he is starting to feel better.

## 2022-05-16 NOTE — ED Notes (Signed)
Pt is awake and alert he is sitting up in bed.  Pt has been given breakfast and juice.   Waiting on provider for eval

## 2022-06-19 ENCOUNTER — Other Ambulatory Visit: Payer: Self-pay

## 2022-06-19 ENCOUNTER — Emergency Department (HOSPITAL_COMMUNITY)
Admission: EM | Admit: 2022-06-19 | Discharge: 2022-06-20 | Disposition: A | Discharge status: Home / Self Care | Payer: Medicaid Other | Attending: Emergency Medicine | Admitting: Emergency Medicine

## 2022-06-19 DIAGNOSIS — R443 Hallucinations, unspecified: Secondary | ICD-10-CM

## 2022-06-19 DIAGNOSIS — F17220 Nicotine dependence, chewing tobacco, uncomplicated: Secondary | ICD-10-CM | POA: Insufficient documentation

## 2022-06-19 DIAGNOSIS — F1410 Cocaine abuse, uncomplicated: Secondary | ICD-10-CM | POA: Diagnosis present

## 2022-06-19 DIAGNOSIS — I10 Essential (primary) hypertension: Secondary | ICD-10-CM | POA: Insufficient documentation

## 2022-06-19 DIAGNOSIS — F251 Schizoaffective disorder, depressive type: Secondary | ICD-10-CM | POA: Diagnosis present

## 2022-06-19 LAB — BASIC METABOLIC PANEL
Anion gap: 12 (ref 5–15)
BUN: 10 mg/dL (ref 8–23)
CO2: 21 mmol/L — ABNORMAL LOW (ref 22–32)
Calcium: 8.9 mg/dL (ref 8.9–10.3)
Chloride: 102 mmol/L (ref 98–111)
Creatinine, Ser: 0.71 mg/dL (ref 0.61–1.24)
GFR, Estimated: >60 mL/min (ref >60)
Glucose, Bld: 92 mg/dL (ref 70–99)
Potassium: 3.7 mmol/L (ref 3.5–5.1)
Sodium: 135 mmol/L (ref 135–145)

## 2022-06-19 LAB — ETHANOL: Alcohol, Ethyl (B): 131 mg/dL — ABNORMAL HIGH (ref <10)

## 2022-06-19 LAB — CBC WITH DIFFERENTIAL/PLATELET
Abs Immature Granulocytes: 0.04 10*3/uL (ref 0.00–0.07)
Basophils Absolute: 0.1 10*3/uL (ref 0.0–0.1)
Basophils Relative: 1 %
Eosinophils Absolute: 0.2 10*3/uL (ref 0.0–0.5)
Eosinophils Relative: 2 %
HCT: 47.8 % (ref 39.0–52.0)
Hemoglobin: 15.8 g/dL (ref 13.0–17.0)
Immature Granulocytes: 0 %
Lymphocytes Relative: 28 %
Lymphs Abs: 2.5 10*3/uL (ref 0.7–4.0)
MCH: 29.9 pg (ref 26.0–34.0)
MCHC: 33.1 g/dL (ref 30.0–36.0)
MCV: 90.4 fL (ref 80.0–100.0)
Monocytes Absolute: 0.6 10*3/uL (ref 0.1–1.0)
Monocytes Relative: 6 %
Neutro Abs: 5.8 10*3/uL (ref 1.7–7.7)
Neutrophils Relative %: 63 %
Platelets: 340 10*3/uL (ref 150–400)
RBC: 5.29 MIL/uL (ref 4.22–5.81)
RDW: 14.6 % (ref 11.5–15.5)
WBC: 9.2 10*3/uL (ref 4.0–10.5)
nRBC: 0 % (ref 0.0–0.2)

## 2022-06-19 LAB — RAPID URINE DRUG SCREEN, HOSP PERFORMED
Amphetamines: NOT DETECTED
Barbiturates: NOT DETECTED
Benzodiazepines: NOT DETECTED
Cocaine: POSITIVE — AB
Opiates: NOT DETECTED
Tetrahydrocannabinol: NOT DETECTED

## 2022-06-19 MED ORDER — QUETIAPINE FUMARATE 300 MG PO TABS
800.0000 mg | ORAL_TABLET | Freq: Every day | ORAL | Status: DC
  Administered 2022-06-19: 800 mg via ORAL
  Filled 2022-06-19: qty 2

## 2022-06-19 MED ORDER — MIRTAZAPINE 7.5 MG PO TABS
15.0000 mg | ORAL_TABLET | Freq: Every day | ORAL | Status: DC
  Administered 2022-06-19: 15 mg via ORAL
  Filled 2022-06-19: qty 2

## 2022-06-19 MED ORDER — BENZTROPINE MESYLATE 1 MG PO TABS
1.0000 mg | ORAL_TABLET | Freq: Every day | ORAL | Status: DC
  Administered 2022-06-19: 1 mg via ORAL
  Filled 2022-06-19: qty 1

## 2022-06-19 NOTE — ED Provider Notes (Signed)
[place] EMERGENCY DEPARTMENT AT [facility] Provider Note   CSN: [id number] Arrival date & time: 06/19/22  1719     History {Add pertinent medical, surgical, social history, OB history to HPI:1} Chief Complaint  Patient presents with   Hallucinations    Edwin Henry is a 62 y.o. male.  She has a history of schizophrenia.  He has been out of his medicine for a few days.  Patient states he is having visual and auditory hallucinations.  Patient does not want to hurt himself or anybody else  The history is provided by the patient and medical records. No language interpreter was used.  Altered Mental Status Presenting symptoms: behavior changes   Severity:  Moderate Most recent episode:  Today Episode history:  Multiple Timing:  Constant Progression:  Waxing and waning Context: not alcohol use   Associated symptoms: hallucinations   Associated symptoms: no abdominal pain, no headaches, no rash and no seizures        Home Medications Prior to Admission medications   Medication Sig Start Date End Date Taking? Authorizing Provider  benztropine (COGENTIN) 1 MG tablet Take 1 mg by mouth at bedtime.   Yes [provider]  feeding supplement (ENSURE ENLIVE / ENSURE PLUS) LIQD Take 237 mLs by mouth 3 (three) times daily between meals. 05/16/22  Yes Lenard Lance, FNP  gabapentin (NEURONTIN) 100 MG capsule Take 100 mg by mouth 2 (two) times daily.   Yes [provider]  Melatonin 10 MG CAPS Take 10 mg by mouth at bedtime.   Yes [provider]  mirtazapine (REMERON) 30 MG tablet Take 30 mg by mouth at bedtime.   Yes [provider]  OLANZapine (ZYPREXA) 5 MG tablet Take 5 mg by mouth at bedtime.   Yes [provider]  QUEtiapine (SEROQUEL) 400 MG tablet Take 2 tablets (800 mg total) by mouth at bedtime. Patient taking differently: Take 400 mg by mouth at bedtime. 05/16/22  Yes Lenard Lance, FNP  OLANZapine (ZYPREXA) 7.5 MG  tablet Take 1 tablet (7.5 mg total) by mouth at bedtime. Patient not taking: Reported on 06/19/2022 05/16/22   Lenard Lance, FNP      Allergies    Patient has no known allergies.    Review of Systems   Review of Systems  Constitutional:  Negative for appetite change and fatigue.  HENT:  Negative for congestion, ear discharge and sinus pressure.   Eyes:  Negative for discharge.  Respiratory:  Negative for cough.   Cardiovascular:  Negative for chest pain.  Gastrointestinal:  Negative for abdominal pain and diarrhea.  Genitourinary:  Negative for frequency and hematuria.  Musculoskeletal:  Negative for back pain.  Skin:  Negative for rash.  Neurological:  Negative for seizures and headaches.  Psychiatric/Behavioral:  Positive for behavioral problems and hallucinations.     Physical Exam Updated Vital Signs BP 102/82 (BP Location: Right Arm)   Pulse (!) 103   Temp 97.7 F (36.5 C) (Oral)   Resp 20   Ht 6\' 2"  (1.88 m)   Wt 79.4 kg   SpO2 98%   BMI 22.47 kg/m  Physical Exam Vitals and nursing note reviewed.  Constitutional:      Appearance: He is well-developed.  HENT:     Head: Normocephalic.     Nose: Nose normal.  Eyes:     General: No scleral icterus.    Conjunctiva/sclera: Conjunctivae normal.  Neck:     Thyroid: No thyromegaly.  Cardiovascular:     Rate and Rhythm: Normal rate and regular rhythm.     Heart sounds: No murmur heard.    No friction rub. No gallop.  Pulmonary:     Breath sounds: No stridor. No wheezing or rales.  Chest:     Chest wall: No tenderness.  Abdominal:     General: There is no distension.     Tenderness: There is no abdominal tenderness. There is no rebound.  Musculoskeletal:        General: Normal range of motion.     Cervical back: Neck supple.  Lymphadenopathy:     Cervical: No cervical adenopathy.  Skin:    Findings: No erythema or rash.  Neurological:     Mental Status: He is alert and oriented to person, place, and time.      Motor: No abnormal muscle tone.     Coordination: Coordination normal.  Psychiatric:     Comments: Visual and auditory hallucinations but not suicidal or homicidal     ED Results / Procedures / Treatments   Labs (all labs ordered are listed, but only abnormal results are displayed) Labs Reviewed  BASIC METABOLIC PANEL - Abnormal; Notable for the following components:      Result Value   CO2 21 (*)    All other components within normal limits  ETHANOL - Abnormal; Notable for the following components:   Alcohol, Ethyl (B) 131 (*)    All other components within normal limits  RAPID URINE DRUG SCREEN, HOSP PERFORMED - Abnormal; Notable for the following components:   Cocaine POSITIVE (*)    All other components within normal limits  CBC WITH DIFFERENTIAL/PLATELET    EKG None  Radiology No results found.  Procedures Procedures  {Document cardiac monitor, telemetry assessment procedure when appropriate:1}  Medications Ordered in ED Medications  QUEtiapine (SEROQUEL) tablet 800 mg (800 mg Oral Given 06/19/22 2104)  mirtazapine (REMERON) tablet 15 mg (15 mg Oral Given 06/19/22 2105)  benztropine (COGENTIN) tablet 1 mg (1 mg Oral Given 06/19/22 2104)    ED Course/ Medical Decision Making/ A&P  Patient was seen by behavioral health.  Their recommendation is to reevaluate the patient in the morning.  Patient is medically cleared {   Click here for ABCD2, HEART and other calculatorsREFRESH Note before signing :1}                          Medical Decision Making Amount and/or Complexity of Data Reviewed Labs: ordered.   Schizophrenia with auditory and visual hallucinations.  Disposition will be determined by behavioral health  {Document critical care time when appropriate:1} {Document review of labs and clinical decision tools ie heart score, Chads2Vasc2 etc:1}  {Document your independent review of radiology images, and any outside records:1} {Document your discussion with  family members, caretakers, and with consultants:1} {Document social determinants of health affecting pt's care:1} {Document your decision making why or why not admission, treatments were needed:1} Final Clinical Impression(s) / ED Diagnoses Final diagnoses:  None    Rx / DC Orders ED Discharge Orders     None

## 2022-06-19 NOTE — Consult Note (Signed)
BH ED ASSESSMENT   Reason for Consult:  Psychiatry Evaluation Referring Physician:  ER Physician Patient Identification: Edwin Henry MRN:  [id number] ED Chief Complaint: Schizoaffective disorder, depressive type (HCC)  Diagnosis:  Principal Problem:   Schizoaffective disorder, depressive type [facility])   ED Assessment Time Calculation: Start Time: 1915 Stop Time: 1936 Total Time in Minutes (Assessment Completion): 21   Subjective:   Edwin Henry is a 62 y.o. male patient admitted with previous hx of Schizoaffective disorder/Schizophrenia, Cannabis use disorder, mild abuse and insomnia brought in by staff from [place] for .AVH for few days.  Patient also states he has not taken medications in three days.  HPI:  AA Male, 62 years old well groomed, calm and cooperative seen for AVH hallucination and insomnia.  Patient reports poor or no sleep day and night.  He denies day time nap and at night he sleeps only three hours.  He denies drinking coffee or any caffeine containing drinks.  Patient reports seeing demons and seeing his mother in his Apartment when she lives in [place].  Patient reports that the voices tells him to walk into traffic all the time.  He reports his mother visited and his neighbors told his mother that they got patient off the road last week.  Patient receives care at [facility].  He states that AVH happens all the time and that taking his medications minimizes it but does not stop it.  Patient sates he is out of his medications and does not know when his next appointment to see his Psychiatrist is.  Patient reports poor appetite as well.  He admits to drinking Liquor once a week and denies drug use.  Patient does not know the names of his medications or dosages.  We will resume home Medications on record and reevaluate in am. Alcohol level is 131, UDS result is pending.  Patient is a client of [employer] and we will call them in am. Call to staff who brought patient from  [facility] to the ER was not answered. Patient constantly asked for medication to help him sleep stating"  if I can get some sleep I will be fine"  Past Psychiatric History: previous hx of Schizoaffective disorder/Schizophrenia, Cannabis use disorder.  Past [facility] inpatient hospitalization noted.  Patient receives care at [facility] as well but has [facility]  Risk to Self or Others: Is the patient at risk to self? No Has the patient been a risk to self in the past 6 months? No Has the patient been a risk to self within the distant past? No Is the patient a risk to others? No Has the patient been a risk to others in the past 6 months? No Has the patient been a risk to others within the distant past? No  [place] Scale:  Flowsheet Row ED from 06/19/2022 in [facility] Emergency Department at [facility] ED from 05/15/2022 in [facility] ED from 04/18/2022 in [facility]  C-SSRS RISK CATEGORY No Risk High Risk No Risk       AIMS:  , , ,  ,   ASAM:    Substance Abuse:     Past Medical History:  Past Medical History:  Diagnosis Date   Hypertension    Insomnia disorder    Schizophrenia (HCC)     Past Surgical History:  Procedure Laterality Date   KNEE SURGERY Right    Family History: No family history on file. Family Psychiatric  History: Denies Social History:  Social History   Substance and Sexual Activity  Alcohol Use Yes   Comment: 1-2 beers every 2 days     Social History   Substance and Sexual Activity  Drug Use Yes   Types: Marijuana, Cocaine   Comment: occasional THC use, 12/15/20 not now    Social History   Socioeconomic History   Marital status: Single    Spouse name: Not on file   Number of children: 1   Years of education: Not on file   Highest education level: 9th grade  Occupational History   Not on file  Tobacco Use   Smoking status: Former    Types: Cigars, Cigarettes    Quit date:  11/07/2019    Years since quitting: 2.6   Smokeless tobacco: Current    Types: Chew  Vaping Use   Vaping Use: Never used  Substance and Sexual Activity   Alcohol use: Yes    Comment: 1-2 beers every 2 days   Drug use: Yes    Types: Marijuana, Cocaine    Comment: occasional THC use, 12/15/20 not now   Sexual activity: Yes    Birth control/protection: Condom  Other Topics Concern   Not on file  Social History Narrative   Completed 9th grade special ed   12/13/20 lives alone in apt   Caffeine- sodas 2 a day, occas energy drink    Social Determinants of Health   Financial Resource Strain: Not on file  Food Insecurity: Not on file  Transportation Needs: Not on file  Physical Activity: Not on file  Stress: Not on file  Social Connections: Not on file   Additional Social History:    Allergies:  No Known Allergies  Labs:  Results for orders placed or performed during the hospital encounter of 06/19/22 (from the past 48 hour(s))  CBC with Differential     Status: None   Collection Time: 06/19/22  7:02 PM  Result Value Ref Range   WBC 9.2 4.0 - 10.5 K/uL   RBC 5.29 4.22 - 5.81 MIL/uL   Hemoglobin 15.8 13.0 - 17.0 g/dL   HCT 09.6 04.5 - 40.9 %   MCV 90.4 80.0 - 100.0 fL   MCH 29.9 26.0 - 34.0 pg   MCHC 33.1 30.0 - 36.0 g/dL   RDW 81.1 91.4 - 78.2 %   Platelets 340 150 - 400 K/uL   nRBC 0.0 0.0 - 0.2 %   Neutrophils Relative % 63 %   Neutro Abs 5.8 1.7 - 7.7 K/uL   Lymphocytes Relative 28 %   Lymphs Abs 2.5 0.7 - 4.0 K/uL   Monocytes Relative 6 %   Monocytes Absolute 0.6 0.1 - 1.0 K/uL   Eosinophils Relative 2 %   Eosinophils Absolute 0.2 0.0 - 0.5 K/uL   Basophils Relative 1 %   Basophils Absolute 0.1 0.0 - 0.1 K/uL   Immature Granulocytes 0 %   Abs Immature Granulocytes 0.04 0.00 - 0.07 K/uL    Comment: Performed at [facility], [address] [place], [place] [address]  Basic metabolic panel     Status: Abnormal   Collection Time: 06/19/22   7:02 PM  Result Value Ref Range   Sodium 135 135 - 145 mmol/L   Potassium 3.7 3.5 - 5.1 mmol/L   Chloride 102 98 - 111 mmol/L   CO2 21 (L) 22 - 32 mmol/L   Glucose, Bld 92 70 - 99 mg/dL    Comment:  Glucose reference range applies only to samples taken after fasting for at least 8 hours.   BUN 10 8 - 23 mg/dL   Creatinine, Ser 1.61 0.61 - 1.24 mg/dL   Calcium 8.9 8.9 - 09.6 mg/dL   GFR, Estimated >04 >54 mL/min    Comment: (NOTE) Calculated using the CKD-EPI Creatinine Equation (2021)    Anion gap 12 5 - 15    Comment: Performed at [facility], [address] [place], [place] [address]  Ethanol     Status: Abnormal   Collection Time: 06/19/22  7:03 PM  Result Value Ref Range   Alcohol, Ethyl (B) 131 (H) <10 mg/dL    Comment: (NOTE) Lowest detectable limit for serum alcohol is 10 mg/dL.  For medical purposes only. Performed at [facility], [address] [place], [place] [address]     Current Facility-Administered Medications  Medication Dose Route Frequency Provider Last Rate Last Admin   benztropine (COGENTIN) tablet 1 mg  1 mg Oral QHS Sparrow Sanzo C, NP       mirtazapine (REMERON) tablet 15 mg  15 mg Oral QHS Yahira Timberman C, NP       QUEtiapine (SEROQUEL) tablet 800 mg  800 mg Oral QHS Dahlia Byes C, NP       Current Outpatient Medications  Medication Sig Dispense Refill   benztropine (COGENTIN) 1 MG tablet Take 1 mg by mouth at bedtime.     feeding supplement (ENSURE ENLIVE / ENSURE PLUS) LIQD Take 237 mLs by mouth 3 (three) times daily between meals. 237 mL 12   gabapentin (NEURONTIN) 100 MG capsule Take 100 mg by mouth 2 (two) times daily.     Melatonin 10 MG TABS Take 10 mg by mouth at bedtime.     mirtazapine (REMERON) 30 MG tablet Take 30 mg by mouth at bedtime.     OLANZapine (ZYPREXA) 7.5 MG tablet Take 1 tablet (7.5 mg total) by mouth at bedtime. 30 tablet 0   QUEtiapine (SEROQUEL) 400 MG tablet Take 2  tablets (800 mg total) by mouth at bedtime. 60 tablet 0    Musculoskeletal: Strength & Muscle Tone: within normal limits Gait & Station: normal Patient leans: Front   Psychiatric Specialty Exam: Presentation  General Appearance:  Casual; Neat  Eye Contact: Good  Speech: Clear and Coherent; Normal Rate  Speech Volume: Normal  Handedness: Right   Mood and Affect  Mood: Depressed  Affect: Congruent; Depressed   Thought Process  Thought Processes: Coherent; Goal Directed; Linear  Descriptions of Associations:Intact  Orientation:Full (Time, Place and Person)  Thought Content:Logical  History of Schizophrenia/Schizoaffective disorder:Yes  Duration of Psychotic Symptoms:Greater than six months  Hallucinations:Hallucinations: Auditory; Visual Description of Auditory Hallucinations: Hears voices telling him to walk out in front of traffic Description of Visual Hallucinations: Sees Demon all the time.  Sees his mom in his apartment but she lives in [place]  Ideas of Reference:None  Suicidal Thoughts:Suicidal Thoughts: No  Homicidal Thoughts:Homicidal Thoughts: No   Sensorium  Memory: Immediate Good; Recent Fair; Remote Fair  Judgment: Intact  Insight: Good   Executive Functions  Concentration: Good  Attention Span: Good  Recall: Fair  Fund of Knowledge: Fair  Language: Good   Psychomotor Activity  Psychomotor Activity: Psychomotor Activity: Normal   Assets  Assets: Communication Skills; Desire for Improvement; Physical Health; Housing; Social Support    Sleep  Sleep: Sleep: Poor Number of Hours of Sleep: 3   Physical Exam: Physical Exam Vitals  and nursing note reviewed.  Constitutional:      Appearance: Normal appearance.  HENT:     Head: Normocephalic and atraumatic.     Nose: Nose normal.  Cardiovascular:     Rate and Rhythm: Normal rate and regular rhythm.  Pulmonary:     Effort: Pulmonary effort is normal.   Musculoskeletal:        General: Normal range of motion.     Cervical back: Normal range of motion.  Skin:    General: Skin is warm and dry.  Neurological:     General: No focal deficit present.     Mental Status: He is alert and oriented to person, place, and time.  Psychiatric:        Attention and Perception: Attention normal. He perceives auditory and visual hallucinations.        Mood and Affect: Mood is depressed.        Speech: Speech normal.        Behavior: Behavior normal. Behavior is cooperative.        Thought Content: Thought content normal.        Cognition and Memory: Cognition and memory normal.        Judgment: Judgment normal.    Review of Systems  Constitutional: Negative.   HENT: Negative.    Eyes: Negative.   Respiratory: Negative.    Cardiovascular: Negative.   Gastrointestinal: Negative.   Genitourinary: Negative.   Musculoskeletal: Negative.   Skin: Negative.   Psychiatric/Behavioral:  Positive for depression and hallucinations. The patient has insomnia.    Blood pressure (!) 154/69, pulse 67, temperature 98.1 F (36.7 C), temperature source Oral, resp. rate 16, height 6\' 2"  (1.88 m), weight 79.4 kg, SpO2 100 %. Body mass index is 22.47 kg/m.  Medical Decision Making: Patient denies SI/HI and no mention of paranoia.  Patient admits to hearing voices telling him to walk into traffic and seeing Demons.  We will reevaluate in am and determine appropriate disposition.  We will resume home Medications.  Problem 1: Schizoaffective disorder, Depressed type  Disposition:  Monitor overnight and reevaluate in am.  Earney Navy, NP--PMHNP-BC 06/19/2022 7:51 PM

## 2022-06-19 NOTE — ED Triage Notes (Addendum)
Pt states he has been "hearing voices and seeing things that ain't there" x few days. Hx Schizophrenia pt has been out of medication x 4 days

## 2022-06-19 NOTE — ED Notes (Signed)
12 lead EKG given to Dr Estell Harpin

## 2022-06-20 DIAGNOSIS — F251 Schizoaffective disorder, depressive type: Secondary | ICD-10-CM

## 2022-06-20 MED ORDER — MELATONIN 3 MG PO TABS
3.0000 mg | ORAL_TABLET | Freq: Every day | ORAL | Status: DC
  Administered 2022-06-20: 3 mg via ORAL
  Filled 2022-06-20: qty 1

## 2022-06-20 MED ORDER — DOXYLAMINE SUCCINATE (SLEEP) 25 MG PO TABS
25.0000 mg | ORAL_TABLET | Freq: Every evening | ORAL | Status: DC | PRN
  Filled 2022-06-20: qty 1

## 2022-06-20 MED ORDER — DOXYLAMINE SUCCINATE (SLEEP) 25 MG PO TABS: 25.0000 mg | ORAL_TABLET | Freq: Every evening | ORAL | 0 refills | Status: DC | PRN

## 2022-06-20 NOTE — ED Notes (Signed)
Patient denied hallucinations at this time.

## 2022-06-20 NOTE — ED Provider Notes (Signed)
Patient seen and cleared by psychiatry.  Patient is feeling well, denies any current hallucinations and states he feels a lot better after being put back on his meds.  Denies any SI.  Will discharge home with return precautions.   Pricilla Loveless, MD 06/20/22 1414

## 2022-06-20 NOTE — Discharge Instructions (Signed)
Make sure you take your medicines as per.  Follow-up with [facility] and/or the behavioral health urgent care as needed.  Stay away from drugs such as cocaine.

## 2022-06-20 NOTE — ED Provider Notes (Signed)
Emergency Medicine Observation Re-evaluation Note  Edwin Henry is a 62 y.o. male, seen on rounds today.  Pt initially presented to the ED for complaints of Hallucinations Currently, the patient is asleep.  Physical Exam  BP 99/62 (BP Location: Right Arm)   Pulse 90   Temp 97.7 F (36.5 C) (Oral)   Resp 20   Ht 6\' 2"  (1.88 m)   Wt 79.4 kg   SpO2 98%   BMI 22.47 kg/m  Physical Exam General: asleep Cardiac: asleep Lungs: asleep Psych: asleep  ED Course / MDM  EKG:EKG Interpretation  Date/Time:  Monday Jun 19 2022 20:14:03 EDT Ventricular Rate:  95 PR Interval:  136 QRS Duration: 82 QT Interval:  346 QTC Calculation: 434 R Axis:   81 Text Interpretation: Normal sinus rhythm Septal infarct , age undetermined  similar to April 2024 Confirmed by Pricilla Loveless [phone]) on 06/20/2022 7:59:48 AM  I have reviewed the labs performed to date as well as medications administered while in observation.  Recent changes in the last 24 hours include home meds.  Plan  Current plan is for psychiatric re-evaluation this morning.    Pricilla Loveless, MD 06/20/22 0800

## 2022-06-20 NOTE — ED Notes (Signed)
Patient discharged off unit to home per provider..  Patient alert,  calm, cooperative, no s/s of distress at this time.  Discharge information given to patient with acknowledged understanding. Belongings given to patient.  Patient ambulatory off unit, escorted by RN. Patient given bus pass for transportation.

## 2022-06-20 NOTE — ED Notes (Signed)
Edwin Henry requested something for sleep melatonin 3mg  order obtained and given.

## 2022-06-20 NOTE — ED Notes (Signed)
Patient alert and oriented this shift. Patient calm and cooperative.  Patient quiet. No suicidal ideation noted. No homicidal ideation noted. Patient ambulatory.  Patient can do all ADLs.

## 2022-06-20 NOTE — Discharge Summary (Signed)
[facility] ED Discharge  06/20/2022 1:54 PM Edwin Henry  MRN:  [id number]  Principal Problem: Schizoaffective disorder, depressive type [facility]) Discharge Diagnoses: Principal Problem:   Schizoaffective disorder, depressive type (HCC) Active Problems:   Cocaine use disorder, mild, abuse (HCC)  Clinical Impression:  Final diagnoses:  None   Subjective: Edwin Henry is a 62 y.o. male patient admitted with previous hx of Schizoaffective disorder/Schizophrenia, Cannabis use disorder, mild abuse and insomnia brought in by staff from [place] for .AVH for few days.  Patient also states he has not taken medications in three days.  Patient was seen this morning alert and oriented x4.  Patient is calm and cooperative and is ready to go home.  UDS is positive for Cocaine which he has been using for a while.  Provider addressed this in reference to his symptoms-AVH.  Patient agrees that he experiences these symptoms when he uses Cocaine.  Patient pleaded with provider not to give this information to his ACT team.  Patient promises not to use Cocaine anymore.   MS Samuel Bouche, head team member at [facility] returned my call and reported that patient is misusing his medications.  Medications for 30 days last only two weeks because he is taking more than prescribed.  Once he is out earlier than expected his Psychotic symptoms comes back.  She says they now only gives him seven days supply of medications at a time.  She added that patient refuses LAI antipsychotic medications. Staff will not be able to pick patient up but she states that patient moves about with Bus.    They will see patient tomorrow she said. Today patient denies SI/HI/AVH and no mention of paranoia.  He is calm and denies feeling anxious.  He asked to be discharged stating his symptoms are gone and promises he will not use Cocaine again.  We reviewed safety plans-call 911 or [phone] or go to the nearest ER of [facility] for mental health crisis.  We  discussed the need to abstain from using Cocaine and seek outpatient Substance abuse treatment.  DR Lucianne Muss, Psychiatrist is in agreement that patient can be discharged and advised to see his ACT team providers.  Patient is Psychiatrically cleared./  ED Assessment Time Calculation: Start Time: 1328 Stop Time: 1354 Total Time in Minutes (Assessment Completion): 26   Past Psychiatric History: see initial Psychiatry evaluation note  Past Medical History:  Past Medical History:  Diagnosis Date   Hypertension    Insomnia disorder    Schizophrenia (HCC)     Past Surgical History:  Procedure Laterality Date   KNEE SURGERY Right    Family History: No family history on file. Family Psychiatric  History: see initial Psychiatry evaluation note Social History:  Social History   Substance and Sexual Activity  Alcohol Use Yes   Comment: 1-2 beers every 2 days     Social History   Substance and Sexual Activity  Drug Use Yes   Types: Marijuana, Cocaine   Comment: occasional THC use, 12/15/20 not now    Social History   Socioeconomic History   Marital status: Single    Spouse name: Not on file   Number of children: 1   Years of education: Not on file   Highest education level: 9th grade  Occupational History   Not on file  Tobacco Use   Smoking status: Former    Types: Cigars, Cigarettes    Quit date: 11/07/2019    Years since quitting: 2.6  Smokeless tobacco: Current    Types: Chew  Vaping Use   Vaping Use: Never used  Substance and Sexual Activity   Alcohol use: Yes    Comment: 1-2 beers every 2 days   Drug use: Yes    Types: Marijuana, Cocaine    Comment: occasional THC use, 12/15/20 not now   Sexual activity: Yes    Birth control/protection: Condom  Other Topics Concern   Not on file  Social History Narrative   Completed 9th grade special ed   12/13/20 lives alone in apt   Caffeine- sodas 2 a day, occas energy drink    Social Determinants of Health   Financial  Resource Strain: Not on file  Food Insecurity: Not on file  Transportation Needs: Not on file  Physical Activity: Not on file  Stress: Not on file  Social Connections: Not on file    Tobacco Cessation:  A prescription for an FDA-approved tobacco cessation medication was offered at discharge and the patient refused  Current Medications: Current Facility-Administered Medications  Medication Dose Route Frequency Provider Last Rate Last Admin   benztropine (COGENTIN) tablet 1 mg  1 mg Oral QHS Meagan Spease C, NP   1 mg at 06/19/22 2104   doxylamine (Sleep) (UNISOM) tablet 25 mg  25 mg Oral QHS PRN Mardene Sayer, MD       melatonin tablet 3 mg  3 mg Oral QHS Vivien Rossetti C, MD   3 mg at 06/20/22 0104   mirtazapine (REMERON) tablet 15 mg  15 mg Oral QHS Marissia Blackham C, NP   15 mg at 06/19/22 2105   QUEtiapine (SEROQUEL) tablet 800 mg  800 mg Oral QHS Marquese Burkland C, NP   800 mg at 06/19/22 2104   Current Outpatient Medications  Medication Sig Dispense Refill   benztropine (COGENTIN) 1 MG tablet Take 1 mg by mouth at bedtime.     feeding supplement (ENSURE ENLIVE / ENSURE PLUS) LIQD Take 237 mLs by mouth 3 (three) times daily between meals. 237 mL 12   gabapentin (NEURONTIN) 100 MG capsule Take 100 mg by mouth 2 (two) times daily.     Melatonin 10 MG CAPS Take 10 mg by mouth at bedtime.     mirtazapine (REMERON) 30 MG tablet Take 30 mg by mouth at bedtime.     OLANZapine (ZYPREXA) 5 MG tablet Take 5 mg by mouth at bedtime.     QUEtiapine (SEROQUEL) 400 MG tablet Take 2 tablets (800 mg total) by mouth at bedtime. (Patient taking differently: Take 400 mg by mouth at bedtime.) 60 tablet 0   OLANZapine (ZYPREXA) 7.5 MG tablet Take 1 tablet (7.5 mg total) by mouth at bedtime. (Patient not taking: Reported on 06/19/2022) 30 tablet 0   PTA Medications: (Not in a hospital admission)   [place] Scale:  Flowsheet Row ED from [facility]19/2022 in [facility] at  [facility] ED from 05/15/2022 in [facility] ED from [facility]/18/2022 in [facility]  C-SSRS RISK CATEGORY No Risk High Risk No Risk       Musculoskeletal: Strength & Muscle Tone: within normal limits Gait & Station: normal Patient leans: Front  Psychiatric Specialty Exam: Presentation  General Appearance:  Casual; Neat  Eye Contact: Good  Speech: Clear and Coherent; Normal Rate  Speech Volume: Normal  Handedness: Right   Mood and Affect  Mood: Anxious  Affect: Congruent   Thought Process  Thought Processes: Coherent; Goal Directed;  Linear  Descriptions of Associations:Intact  Orientation:Full (Time, Place and Person)  Thought Content:Logical  History of Schizophrenia/Schizoaffective disorder:Yes  Duration of Psychotic Symptoms:Greater than six months  Hallucinations:Hallucinations: None Description of Auditory Hallucinations: Hears voices telling him to walk out in front of traffic Description of Visual Hallucinations: Sees Demon all the time.  Sees his mom in his apartment but she lives in [place]  Ideas of Reference:None  Suicidal Thoughts:Suicidal Thoughts: No  Homicidal Thoughts:Homicidal Thoughts: No   Sensorium  Memory: Immediate Good; Recent Good; Remote Good  Judgment: Good  Insight: Good   Executive Functions  Concentration: Good  Attention Span: Good  Recall: Good  Fund of Knowledge: Good  Language: Good   Psychomotor Activity  Psychomotor Activity: Psychomotor Activity: Normal   Assets  Assets: Communication Skills; Desire for Improvement; Housing; Social Support   Sleep  Sleep: Sleep: Fair Number of Hours of Sleep: 3    Physical Exam: Physical Exam Vitals and nursing note reviewed.  Constitutional:      Appearance: Normal appearance.  HENT:     Head: Normocephalic and atraumatic.     Nose: Nose normal.  Cardiovascular:     Rate  and Rhythm: Normal rate and regular rhythm.  Pulmonary:     Effort: Pulmonary effort is normal.  Musculoskeletal:        General: Normal range of motion.     Cervical back: Normal range of motion.  Skin:    General: Skin is warm and dry.  Neurological:     General: No focal deficit present.     Mental Status: He is alert and oriented to person, place, and time.  Psychiatric:        Attention and Perception: Attention and perception normal.        Mood and Affect: Mood is anxious.        Speech: Speech normal.        Behavior: Behavior normal. Behavior is cooperative.        Thought Content: Thought content normal.        Cognition and Memory: Cognition and memory normal.        Judgment: Judgment normal.    Review of Systems  Constitutional: Negative.   HENT: Negative.    Eyes: Negative.   Respiratory: Negative.    Cardiovascular: Negative.   Gastrointestinal: Negative.   Musculoskeletal: Negative.   Skin: Negative.   Neurological: Negative.   Endo/Heme/Allergies: Negative.   Psychiatric/Behavioral:  The patient is nervous/anxious.    Blood pressure 129/70, pulse 79, temperature 98 F (36.7 C), temperature source Oral, resp. rate 16, height 6\' 2"  (1.88 m), weight 79.4 kg, SpO2 98 %. Body mass index is 22.47 kg/m.   Demographic Factors:  Male, Adolescent or young adult, Low socioeconomic status, Living alone, Unemployed, and receives Statistician, Has ACT TEAM  Loss Factors: NA  Historical Factors: NA  Risk Reduction Factors:   Positive social support  Continued Clinical Symptoms:  Alcohol/Substance Abuse/Dependencies Schizophrenia:   Depressive state More than one psychiatric diagnosis Previous Psychiatric Diagnoses and Treatments  Cognitive Features That Contribute To Risk:  None    Suicide Risk:  Minimal: No identifiable suicidal ideation.  Patients presenting with no risk factors but with morbid ruminations; may be classified as minimal risk  based on the severity of the depressive symptoms    Plan Of Care/Follow-up recommendations:  Activity:  As tolerated Diet:  Regular  Medical Decision Making: Patient denies SI/HI/AVH and agrees that using Cocaine aggravates his symptoms.  Patient will be seen by his ACT team tomorrow they said.  Patient does not meet criteria for inpatient mental health Hospitalization.  Patient and provider addressed the need to abstain from all illicit drugs.  Patient is Psychiatrically cleared. Problem 1:Schizoaffective disorder, depressed type  Disposition: Psychiatrically cleared. Earney Navy, NP-PMHNP-BC 06/20/2022, 1:54 PM

## 2022-08-23 ENCOUNTER — Ambulatory Visit (INDEPENDENT_AMBULATORY_CARE_PROVIDER_SITE_OTHER): Payer: MEDICAID | Admitting: Nurse Practitioner

## 2022-08-23 ENCOUNTER — Encounter: Payer: Self-pay | Admitting: Nurse Practitioner

## 2022-08-23 VITALS — BP 124/78 | HR 68 | Ht 74.0 in | Wt 169.2 lb

## 2022-08-23 DIAGNOSIS — F251 Schizoaffective disorder, depressive type: Secondary | ICD-10-CM

## 2022-08-23 DIAGNOSIS — G4719 Other hypersomnia: Secondary | ICD-10-CM | POA: Diagnosis not present

## 2022-08-23 DIAGNOSIS — F513 Sleepwalking [somnambulism]: Secondary | ICD-10-CM

## 2022-08-23 DIAGNOSIS — R0683 Snoring: Secondary | ICD-10-CM

## 2022-08-23 DIAGNOSIS — F5104 Psychophysiologic insomnia: Secondary | ICD-10-CM

## 2022-08-23 DIAGNOSIS — R443 Hallucinations, unspecified: Secondary | ICD-10-CM

## 2022-08-23 DIAGNOSIS — F5101 Primary insomnia: Secondary | ICD-10-CM

## 2022-08-23 NOTE — Patient Instructions (Addendum)
Given your symptoms and history with disrupted nighttime sleep, I am concerned that you may have sleep disordered breathing with sleep apnea. You will need a sleep study for further evaluation. Someone will contact you to schedule this.   Your sleep walking is likely medication induced given timing of medication adjustment and onset of the sleep walking. Continue to work with your psychiatrist regarding this. Use extreme caution. Put aware sharp or dangerous items at night. Lock your doors and windows.  You can use child locks as well. Remove tripping hazards   We discussed how untreated sleep apnea puts an individual at risk for cardiac arrhthymias, pulm HTN, DM, stroke and increases their risk for daytime accidents. We also briefly reviewed treatment options including weight loss, side sleeping position, oral appliance, CPAP therapy or referral to ENT for possible surgical options  Use caution when driving and pull over if you become sleepy.  Follow up in 6 weeks with Dr. Wynona Neat to go over sleep study results, or sooner, if needed

## 2022-08-23 NOTE — Progress Notes (Signed)
@Patient  ID: [name], male    DOB: 1960-06-23, 62 y.o.   MRN: [id number]  Chief Complaint  Patient presents with   Consult    No sleep study. Patient has been sleep walking, hallucinating, and overall not sleeping well- getting 5hrs at the most     Referring provider: No ref. provider found  HPI: 62 year old male, former smoker referred for sleep consult. Past medical history significant for schizoaffective disorder, drug induced parkinsonism, substance abuse, insomnia.  TEST/EVENTS:   08/23/2022: Today - sleep consult Patient presents today for sleep consult with nurse from [facility]. He has a history of schizoaffective disorder and chronic insomnia. They have been working to manage his mood and insomnia. They recently adjusted his seroquel down but then he wasn't able to sleep at night. They then increased it and since then, he has been having trouble with sleep walking and hallucinations. He's also on melatonin, Zyprexa and Remeron at bedtime. He tells me that he has had trouble with his sleep for many, many years. He's always been off and on medications. He has trouble with both sleep latency and maintenance. He never had sleep walking prior to this. He has been told that he snores. He denies any sleep paralysis, morning headaches, witnessed apneas, excessive daytime fatigue.  He goes to bed at various times. Sleep length depends on his medications. He does tend to wake up. He has never had a sleep study. He is not on oxygen. No weight gain. He does not have a history of cardiac disease, stroke, or diabetes. No SI/HI. He is a former smoker. No current drug use. He does occasionally drink alcohol. Lives by himself in an apartment. He has started deadbolting his door at night because the police had found him in the street one night.   [place] 1  No Known Allergies   There is no immunization history on file for this patient.  Past Medical History:  Diagnosis  Date   Hypertension    Insomnia disorder    Schizophrenia (HCC)     Tobacco History: Social History   Tobacco Use  Smoking Status Former   Current packs/day: 0.00   Types: Cigars, Cigarettes   Quit date: 11/07/2019   Years since quitting: 2.8  Smokeless Tobacco Current   Types: Chew   Ready to quit: Not Answered Counseling given: Not Answered   Outpatient Medications Prior to Visit  Medication Sig Dispense Refill   benztropine (COGENTIN) 1 MG tablet Take 1 mg by mouth at bedtime.     feeding supplement (ENSURE ENLIVE / ENSURE PLUS) LIQD Take 237 mLs by mouth 3 (three) times daily between meals. 237 mL 12   gabapentin (NEURONTIN) 100 MG capsule Take 100 mg by mouth 2 (two) times daily.     Melatonin 10 MG CAPS Take 10 mg by mouth at bedtime.     mirtazapine (REMERON) 30 MG tablet Take 30 mg by mouth at bedtime.     OLANZapine (ZYPREXA) 5 MG tablet Take 5 mg by mouth at bedtime.     OLANZapine (ZYPREXA) 7.5 MG tablet Take 1 tablet (7.5 mg total) by mouth at bedtime. 30 tablet 0   QUEtiapine (SEROQUEL) 400 MG tablet Take 2 tablets (800 mg total) by mouth at bedtime. (Patient taking differently: Take 400 mg by mouth at bedtime.) 60 tablet 0   No facility-administered medications prior to visit.     Review of Systems:   Constitutional: No weight loss or gain, night  sweats, fevers, chills, or lassitude. +occasional fatigue  HEENT: No headaches, difficulty swallowing, tooth/dental problems, or sore throat. No sneezing, itching, ear ache, nasal congestion, or post nasal drip CV:  No chest pain, orthopnea, PND, swelling in lower extremities, anasarca, dizziness, palpitations, syncope Resp: +snoring. No shortness of breath with exertion or at rest. No excess mucus or change in color of mucus. No productive or non-productive. No hemoptysis. No wheezing.  No chest wall deformity GI:  No heartburn, indigestion Skin: No rash, lesions, ulcerations MSK:  No joint pain or swelling.   Neuro: No dizziness or lightheadedness.  Psych: +depression, anxiety (stable). No SI/HI. Mood stable. +sleep parasomnias    Physical Exam:  BP 124/78 (BP Location: Left Arm, Patient Position: Sitting, Cuff Size: Normal)   Pulse 68   Ht 6\' 2"  (1.88 m)   Wt 169 lb 3.2 oz (76.7 kg)   SpO2 96%   BMI 21.72 kg/m   GEN: Pleasant, interactive, well-appearing; in no acute distress HEENT:  Normocephalic and atraumatic. PERRLA. Sclera white. Nasal turbinates pink, moist and patent bilaterally. No rhinorrhea present. Oropharynx pink and moist, without exudate or edema. No lesions, ulcerations, or postnasal drip. Mallampati II NECK:  Supple w/ fair ROM. No JVD present. Normal carotid impulses w/o bruits. Thyroid symmetrical with no goiter or nodules palpated. No lymphadenopathy.   CV: RRR, no m/r/g, no peripheral edema. Pulses intact, +2 bilaterally. No cyanosis, pallor or clubbing. PULMONARY:  Unlabored, regular breathing. Clear bilaterally A&P w/o wheezes/rales/rhonchi. No accessory muscle use.  GI: BS present and normoactive. Soft, non-tender to palpation. No organomegaly or masses detected.  MSK: No erythema, warmth or tenderness. Cap refil <2 sec all extrem. No deformities or joint swelling noted.  Neuro: A/Ox3. No focal deficits noted.   Skin: Warm, no lesions or rashe Psych: Normal affect and behavior. Judgement and thought content appropriate.     Lab Results:  CBC    Component Value Date/Time   WBC 9.2 06/19/2022 1902   RBC 5.29 06/19/2022 1902   HGB 15.8 06/19/2022 1902   HCT 47.8 06/19/2022 1902   PLT 340 06/19/2022 1902   MCV 90.4 06/19/2022 1902   MCH 29.9 06/19/2022 1902   MCHC 33.1 06/19/2022 1902   RDW 14.6 06/19/2022 1902   LYMPHSABS 2.5 06/19/2022 1902   MONOABS 0.6 06/19/2022 1902   EOSABS 0.2 06/19/2022 1902   BASOSABS 0.1 06/19/2022 1902    BMET    Component Value Date/Time   NA 135 06/19/2022 1902   K 3.7 06/19/2022 1902   CL 102 06/19/2022 1902   CO2  21 (L) 06/19/2022 1902   GLUCOSE 92 06/19/2022 1902   BUN 10 06/19/2022 1902   CREATININE 0.71 06/19/2022 1902   CALCIUM 8.9 06/19/2022 1902   GFRNONAA >60 06/19/2022 1902   GFRAA >60 10/01/2018 2342    BNP No results found for: "BNP"   Imaging:  No results found.  Administration History     None           No data to display          No results found for: "NITRICOXIDE"      Assessment & Plan:   Loud snoring He has snoring, occasional daytime fatigue, restless sleep for many years, unrelated to medications per his report. He now has sleep walking, which I suspect is medication induced given timing. Given the length of symptoms and difficulties managing his insomnia, I think it is appropriate to have him complete in lab study to rule  out sleep disordered breathing or REM sleep disorder. I do think the majority of this is related to his psychiatric conditions.    - discussed how weight can impact sleep and risk for sleep disordered breathing - discussed options to assist with weight loss: combination of diet modification, cardiovascular and strength training exercises   - had an extensive discussion regarding the adverse health consequences related to untreated sleep disordered breathing - specifically discussed the risks for hypertension, coronary artery disease, cardiac dysrhythmias, cerebrovascular disease, and diabetes - lifestyle modification discussed   - discussed how sleep disruption can increase risk of accidents, particularly when driving - safe driving practices were discussed   Insomnia disorder See above   Schizoaffective disorder, depressive type (HCC) See above. Follow up with psychiatry as scheduled    I spent 35 minutes of dedicated to the care of this patient on the date of this encounter to include pre-visit review of records, face-to-face time with the patient discussing conditions above, post visit ordering of testing, clinical  documentation with the electronic health record, making appropriate referrals as documented, and communicating necessary findings to members of the patients care team.  Noemi Chapel, NP 08/25/2022  Pt aware and understands NP's role.

## 2022-08-25 ENCOUNTER — Encounter: Payer: Self-pay | Admitting: Nurse Practitioner

## 2022-08-25 DIAGNOSIS — R0683 Snoring: Secondary | ICD-10-CM | POA: Insufficient documentation

## 2022-08-25 NOTE — Assessment & Plan Note (Signed)
See above

## 2022-08-25 NOTE — Assessment & Plan Note (Addendum)
See above. Follow up with psychiatry as scheduled

## 2022-08-25 NOTE — Assessment & Plan Note (Signed)
He has snoring, occasional daytime fatigue, restless sleep for many years, unrelated to medications per his report. He now has sleep walking, which I suspect is medication induced given timing. Given the length of symptoms and difficulties managing his insomnia, I think it is appropriate to have him complete in lab study to rule out sleep disordered breathing or REM sleep disorder. I do think the majority of this is related to his psychiatric conditions.    - discussed how weight can impact sleep and risk for sleep disordered breathing - discussed options to assist with weight loss: combination of diet modification, cardiovascular and strength training exercises   - had an extensive discussion regarding the adverse health consequences related to untreated sleep disordered breathing - specifically discussed the risks for hypertension, coronary artery disease, cardiac dysrhythmias, cerebrovascular disease, and diabetes - lifestyle modification discussed   - discussed how sleep disruption can increase risk of accidents, particularly when driving - safe driving practices were discussed

## 2022-09-18 ENCOUNTER — Emergency Department (HOSPITAL_COMMUNITY)
Admission: EM | Admit: 2022-09-18 | Discharge: 2022-09-19 | Disposition: A | Discharge status: Home / Self Care | Payer: MEDICAID | Attending: Emergency Medicine | Admitting: Emergency Medicine

## 2022-09-18 ENCOUNTER — Other Ambulatory Visit: Payer: Self-pay

## 2022-09-18 ENCOUNTER — Encounter (HOSPITAL_COMMUNITY): Payer: Self-pay

## 2022-09-18 DIAGNOSIS — R45851 Suicidal ideations: Secondary | ICD-10-CM | POA: Diagnosis not present

## 2022-09-18 DIAGNOSIS — F251 Schizoaffective disorder, depressive type: Secondary | ICD-10-CM | POA: Diagnosis not present

## 2022-09-18 DIAGNOSIS — R440 Auditory hallucinations: Secondary | ICD-10-CM | POA: Diagnosis present

## 2022-09-18 DIAGNOSIS — R443 Hallucinations, unspecified: Secondary | ICD-10-CM

## 2022-09-18 LAB — CBC WITH DIFFERENTIAL/PLATELET
Abs Immature Granulocytes: 0.01 10*3/uL (ref 0.00–0.07)
Basophils Absolute: 0.1 10*3/uL (ref 0.0–0.1)
Basophils Relative: 1 %
Eosinophils Absolute: 0.2 10*3/uL (ref 0.0–0.5)
Eosinophils Relative: 3 %
HCT: 48 % (ref 39.0–52.0)
Hemoglobin: 15.8 g/dL (ref 13.0–17.0)
Immature Granulocytes: 0 %
Lymphocytes Relative: 39 %
Lymphs Abs: 2.8 10*3/uL (ref 0.7–4.0)
MCH: 30.4 pg (ref 26.0–34.0)
MCHC: 32.9 g/dL (ref 30.0–36.0)
MCV: 92.5 fL (ref 80.0–100.0)
Monocytes Absolute: 0.5 10*3/uL (ref 0.1–1.0)
Monocytes Relative: 6 %
Neutro Abs: 3.5 10*3/uL (ref 1.7–7.7)
Neutrophils Relative %: 51 %
Platelets: 301 10*3/uL (ref 150–400)
RBC: 5.19 MIL/uL (ref 4.22–5.81)
RDW: 13.6 % (ref 11.5–15.5)
WBC: 7.2 10*3/uL (ref 4.0–10.5)
nRBC: 0 % (ref 0.0–0.2)

## 2022-09-18 LAB — COMPREHENSIVE METABOLIC PANEL
ALT: 26 U/L (ref 0–44)
AST: 23 U/L (ref 15–41)
Albumin: 4.3 g/dL (ref 3.5–5.0)
Alkaline Phosphatase: 98 U/L (ref 38–126)
Anion gap: 11 (ref 5–15)
BUN: 10 mg/dL (ref 8–23)
CO2: 25 mmol/L (ref 22–32)
Calcium: 9.4 mg/dL (ref 8.9–10.3)
Chloride: 106 mmol/L (ref 98–111)
Creatinine, Ser: 0.86 mg/dL (ref 0.61–1.24)
GFR, Estimated: >60 mL/min (ref >60)
Glucose, Bld: 99 mg/dL (ref 70–99)
Potassium: 3.9 mmol/L (ref 3.5–5.1)
Sodium: 142 mmol/L (ref 135–145)
Total Bilirubin: 0.5 mg/dL (ref 0.3–1.2)
Total Protein: 8.3 g/dL — ABNORMAL HIGH (ref 6.5–8.1)

## 2022-09-18 LAB — ACETAMINOPHEN LEVEL: Acetaminophen (Tylenol), Serum: <10 ug/mL — ABNORMAL LOW (ref 10–30)

## 2022-09-18 LAB — ETHANOL: Alcohol, Ethyl (B): 204 mg/dL — ABNORMAL HIGH (ref <10)

## 2022-09-18 LAB — SALICYLATE LEVEL: Salicylate Lvl: <7 mg/dL — ABNORMAL LOW (ref 7.0–30.0)

## 2022-09-18 MED ORDER — MIRTAZAPINE 30 MG PO TABS
30.0000 mg | ORAL_TABLET | Freq: Every day | ORAL | Status: DC
  Administered 2022-09-18: 30 mg via ORAL
  Filled 2022-09-18: qty 1

## 2022-09-18 MED ORDER — GABAPENTIN 100 MG PO CAPS
100.0000 mg | ORAL_CAPSULE | Freq: Two times a day (BID) | ORAL | Status: DC
  Administered 2022-09-18: 100 mg via ORAL
  Filled 2022-09-18: qty 1

## 2022-09-18 MED ORDER — BENZTROPINE MESYLATE 0.5 MG PO TABS
1.0000 mg | ORAL_TABLET | Freq: Every day | ORAL | Status: DC
  Administered 2022-09-18: 1 mg via ORAL
  Filled 2022-09-18: qty 2

## 2022-09-18 MED ORDER — QUETIAPINE FUMARATE 300 MG PO TABS
400.0000 mg | ORAL_TABLET | Freq: Every day | ORAL | Status: DC
  Administered 2022-09-18: 400 mg via ORAL
  Filled 2022-09-18: qty 1

## 2022-09-18 NOTE — Consult Note (Signed)
BH ED ASSESSMENT   Reason for Consult:  Psychiatry evaluation Referring Physician:  ER Physician Patient Identification: Edwin Henry MRN:  [id number] ED Chief Complaint: Suicide ideation  Diagnosis:  Principal Problem:   Suicide ideation Active Problems:   Schizoaffective disorder, depressive type [facility])   ED Assessment Time Calculation: Start Time: 1901 Stop Time: 1929 Total Time in Minutes (Assessment Completion): 28   Subjective:   Edwin Henry is a 62 y.o. male patient admitted with Schizoaffective disorder, Depressed type /Schizophrenia, Cannabis abuse, Cocaine abuse and insomnia brought in by staff from [place] for [facility] which is Chronic for him.  Patient states something is different with this AH because the voices are telling him to kill himself.  Patient is refusing to give Urine at this time.  HPI:  Patient is AA male who came in due to [facility] with the voices telling him to kill himself.  Patient states that he takes his Prescribed medications from [facility] and that the medication for sleep helps him sleep without hearing the voices.  Patient states that is out of his sleep Medication and that the voices are keeping him awake.  When asked about using Cannabis or Cocaine patient declined to answer question.  When asked to give urine for UDS he refused.  Patient states he is suicidal with plan to jump into traffic.  He added that he walked into traffic and passersby called his mother and they brought him off traffic.  Patient is calm and sitting in a stretcher.  He reports good appetite but poor sleep when he is off his medications.   AA Male, well groomed and well nourished came in suicidal with plan to jump into traffic.  He says he has tried getting into moving traffic in the past.  He says he is hearing voices telling him to kill himself.  Patient is alert and oriented x4.  Patient lives in his apartment and gets care from [facility].  Effort to reach [facility] this late failed.  Provider  will resume Medications in his record and will contact [facility] tomorrow morning.  He denies HI/VH.  Past Psychiatric History:  Previous hx of Schizoaffective disorder/Schizophrenia, Cannabis use disorder.  Past [facility] inpatient hospitalization noted.  Patient receives care at [facility] as well but has [facility] ACT Team.  Patient in the ER in May for exact complaint.  Risk to Self or Others: Is the patient at risk to self? Yes Has the patient been a risk to self in the past 6 months? Yes Has the patient been a risk to self within the distant past? Yes Is the patient a risk to others? No Has the patient been a risk to others in the past 6 months? No Has the patient been a risk to others within the distant past? No  [place] Scale:  Flowsheet Row ED from 09/18/2022 in [facility] Emergency Department at [facility] ED from 06/19/2022 in [facility] Emergency Department at [facility] ED from 05/15/2022 in [facility]  C-SSRS RISK CATEGORY High Risk No Risk High Risk       AIMS:  , , ,  ,   ASAM:    Substance Abuse:     Past Medical History:  Past Medical History:  Diagnosis Date   Hypertension    Insomnia disorder    Schizophrenia (HCC)     Past Surgical History:  Procedure Laterality Date   KNEE SURGERY Right    Family History: History reviewed. No pertinent  family history. Family Psychiatric  History: Denies Social History:  Social History   Substance and Sexual Activity  Alcohol Use Yes   Comment: 1-2 beers every 2 days     Social History   Substance and Sexual Activity  Drug Use Not Currently   Types: Marijuana, Cocaine   Comment: occasional THC use, 12/15/20 not now    Social History   Socioeconomic History   Marital status: Single    Spouse name: Not on file   Number of children: 1   Years of education: Not on file   Highest education level: 9th grade  Occupational History   Not on file  Tobacco Use   Smoking status:  Every Day    Current packs/day: 0.00    Types: Cigars, Cigarettes    Last attempt to quit: 11/07/2019    Years since quitting: 2.8   Smokeless tobacco: Current    Types: Chew  Vaping Use   Vaping status: Never Used  Substance and Sexual Activity   Alcohol use: Yes    Comment: 1-2 beers every 2 days   Drug use: Not Currently    Types: Marijuana, Cocaine    Comment: occasional THC use, 12/15/20 not now   Sexual activity: Yes    Birth control/protection: Condom  Other Topics Concern   Not on file  Social History Narrative   Completed 9th grade special ed   12/13/20 lives alone in apt   Caffeine- sodas 2 a day, occas energy drink    Social Determinants of Health   Financial Resource Strain: Not on file  Food Insecurity: Not on file  Transportation Needs: Not on file  Physical Activity: Not on file  Stress: Not on file  Social Connections: Not on file   Additional Social History:    Allergies:  No Known Allergies  Labs:  Results for orders placed or performed during the hospital encounter of 09/18/22 (from the past 48 hour(s))  Comprehensive metabolic panel     Status: Abnormal   Collection Time: 09/18/22  3:45 PM  Result Value Ref Range   Sodium 142 135 - 145 mmol/L   Potassium 3.9 3.5 - 5.1 mmol/L   Chloride 106 98 - 111 mmol/L   CO2 25 22 - 32 mmol/L   Glucose, Bld 99 70 - 99 mg/dL    Comment: Glucose reference range applies only to samples taken after fasting for at least 8 hours.   BUN 10 8 - 23 mg/dL   Creatinine, Ser 8.11 0.61 - 1.24 mg/dL   Calcium 9.4 8.9 - 91.4 mg/dL   Total Protein 8.3 (H) 6.5 - 8.1 g/dL   Albumin 4.3 3.5 - 5.0 g/dL   AST 23 15 - 41 U/L   ALT 26 0 - 44 U/L   Alkaline Phosphatase 98 38 - 126 U/L   Total Bilirubin 0.5 0.3 - 1.2 mg/dL   GFR, Estimated >78 >29 mL/min    Comment: (NOTE) Calculated using the CKD-EPI Creatinine Equation (2021)    Anion gap 11 5 - 15    Comment: Performed at [facility], [address] [place], [place] [address]  Ethanol     Status: Abnormal   Collection Time: 09/18/22  3:45 PM  Result Value Ref Range   Alcohol, Ethyl (B) 204 (H) <10 mg/dL    Comment: (NOTE) Lowest detectable limit for serum alcohol is 10 mg/dL.  For medical purposes only. Performed at [facility], [address] [place],  [place] [address]   CBC with Diff     Status: None   Collection Time: 09/18/22  3:45 PM  Result Value Ref Range   WBC 7.2 4.0 - 10.5 K/uL   RBC 5.19 4.22 - 5.81 MIL/uL   Hemoglobin 15.8 13.0 - 17.0 g/dL   HCT 95.2 84.1 - 32.4 %   MCV 92.5 80.0 - 100.0 fL   MCH 30.4 26.0 - 34.0 pg   MCHC 32.9 30.0 - 36.0 g/dL   RDW 40.1 02.7 - 25.3 %   Platelets 301 150 - 400 K/uL   nRBC 0.0 0.0 - 0.2 %   Neutrophils Relative % 51 %   Neutro Abs 3.5 1.7 - 7.7 K/uL   Lymphocytes Relative 39 %   Lymphs Abs 2.8 0.7 - 4.0 K/uL   Monocytes Relative 6 %   Monocytes Absolute 0.5 0.1 - 1.0 K/uL   Eosinophils Relative 3 %   Eosinophils Absolute 0.2 0.0 - 0.5 K/uL   Basophils Relative 1 %   Basophils Absolute 0.1 0.0 - 0.1 K/uL   Immature Granulocytes 0 %   Abs Immature Granulocytes 0.01 0.00 - 0.07 K/uL    Comment: Performed at [facility], [address], [place], [place] [address]  Salicylate level     Status: Abnormal   Collection Time: 09/18/22  3:45 PM  Result Value Ref Range   Salicylate Lvl <7.0 (L) 7.0 - 30.0 mg/dL    Comment: Performed at [facility], [address], [place], [place] [address]  Acetaminophen level     Status: Abnormal   Collection Time: 09/18/22  3:45 PM  Result Value Ref Range   Acetaminophen (Tylenol), Serum <10 (L) 10 - 30 ug/mL    Comment: (NOTE) Therapeutic concentrations vary significantly. A range of 10-30 ug/mL  may be an effective concentration for many patients. However, some  are best treated at concentrations outside of this range. Acetaminophen concentrations >150 ug/mL at 4 hours after  ingestion  and >50 ug/mL at 12 hours after ingestion are often associated with  toxic reactions.  Performed at [facility], [address], [place], [place] [address]     Current Facility-Administered Medications  Medication Dose Route Frequency Provider Last Rate Last Admin   benztropine (COGENTIN) tablet 1 mg  1 mg Oral QHS Illyria Sobocinski C, NP       gabapentin (NEURONTIN) capsule 100 mg  100 mg Oral BID Muneeb Veras C, NP       mirtazapine (REMERON) tablet 30 mg  30 mg Oral QHS Maximum Reiland C, NP       QUEtiapine (SEROQUEL) tablet 400 mg  400 mg Oral QHS Dahlia Byes C, NP       Current Outpatient Medications  Medication Sig Dispense Refill   benztropine (COGENTIN) 1 MG tablet Take 1 mg by mouth at bedtime.     feeding supplement (ENSURE ENLIVE / ENSURE PLUS) LIQD Take 237 mLs by mouth 3 (three) times daily between meals. 237 mL 12   gabapentin (NEURONTIN) 100 MG capsule Take 100 mg by mouth 2 (two) times daily.     Melatonin 10 MG CAPS Take 10 mg by mouth at bedtime.     mirtazapine (REMERON) 30 MG tablet Take 30 mg by mouth at bedtime.     OLANZapine (ZYPREXA) 5 MG tablet Take 5 mg by mouth at bedtime.     OLANZapine (ZYPREXA) 7.5 MG tablet Take 1 tablet (7.5 mg total) by mouth at  bedtime. 30 tablet 0   QUEtiapine (SEROQUEL) 400 MG tablet Take 2 tablets (800 mg total) by mouth at bedtime. (Patient taking differently: Take 400 mg by mouth at bedtime.) 60 tablet 0    Musculoskeletal: Strength & Muscle Tone: within normal limits Gait & Station: normal Patient leans: Front   Psychiatric Specialty Exam: Presentation  General Appearance:  Casual; Neat  Eye Contact: Good  Speech: Clear and Coherent; Normal Rate  Speech Volume: Normal  Handedness: Right   Mood and Affect  Mood: Anxious; Depressed  Affect: Congruent   Thought Process  Thought Processes: Coherent; Goal Directed  Descriptions of  Associations:Intact  Orientation:Full (Time, Place and Person)  Thought Content:Logical  History of Schizophrenia/Schizoaffective disorder:Yes  Duration of Psychotic Symptoms:Greater than six months  Hallucinations:Hallucinations: Auditory Description of Auditory Hallucinations: Voices telling him to kill himself-Chronic Auditory hallucination  Ideas of Reference:None  Suicidal Thoughts:Suicidal Thoughts: Yes, Active SI Active Intent and/or Plan: With Intent; With Plan; With Means to Carry Out; With Access to Means (Walk into traffic)  Homicidal Thoughts:Homicidal Thoughts: No   Sensorium  Memory: Immediate Good; Recent Good; Remote Good  Judgment: Fair  Insight: Fair   Art therapist  Concentration: Good  Attention Span: Good  Recall: Good  Fund of Knowledge: Good  Language: Good   Psychomotor Activity  Psychomotor Activity: Psychomotor Activity: Normal   Assets  Assets: Communication Skills; Desire for Improvement; Housing; Social Support    Sleep  Sleep: Sleep: Fair   Physical Exam: Physical Exam Vitals and nursing note reviewed.  Constitutional:      Appearance: Normal appearance. He is normal weight.  HENT:     Head: Normocephalic.     Nose: Nose normal.  Cardiovascular:     Rate and Rhythm: Tachycardia present.  Pulmonary:     Effort: Pulmonary effort is normal.  Musculoskeletal:        General: Normal range of motion.     Cervical back: Normal range of motion.  Skin:    General: Skin is dry.  Neurological:     Mental Status: He is alert and oriented to person, place, and time.  Psychiatric:        Attention and Perception: Attention normal. He perceives auditory hallucinations.        Mood and Affect: Mood is anxious.        Speech: Speech normal.        Behavior: Behavior normal. Behavior is cooperative.        Thought Content: Thought content includes suicidal ideation. Thought content includes suicidal plan.         Cognition and Memory: Cognition and memory normal.        Judgment: Judgment is impulsive.    Review of Systems  Constitutional: Negative.   HENT: Negative.    Eyes: Negative.   Respiratory: Negative.    Cardiovascular: Negative.   Gastrointestinal: Negative.   Genitourinary: Negative.   Musculoskeletal: Negative.   Skin: Negative.   Neurological: Negative.   Endo/Heme/Allergies: Negative.   Psychiatric/Behavioral:  Positive for hallucinations and suicidal ideas. The patient is nervous/anxious and has insomnia.    Blood pressure (!) 137/100, pulse (!) 106, temperature 98 F (36.7 C), resp. rate 18, height 6\' 2"  (1.88 m), weight 76.7 kg, SpO2 99%. Body mass index is 21.71 kg/m.  Medical Decision Making: Patient will spend the night and we will revaluate in am.  Patient will resume home Medications especially sleep Medication tonight.  Problem 1: Schizoaffective disorder, depressed type with Psychosis  Problem 2: Suicide ideation.  Disposition:  Monitor overnight and reevaluate in am.  Earney Navy, NP-PMHNP-BC 09/18/2022 7:29 PM

## 2022-09-18 NOTE — ED Notes (Signed)
ED Provider at bedside. 

## 2022-09-18 NOTE — ED Notes (Signed)
Pt has been dressed out into burgundy scrubs. All belongings have been placed in the cabinet in the 9-12 section. Security has been wanded.

## 2022-09-18 NOTE — ED Triage Notes (Signed)
Pt arrived POV with care manager. Report auditory and visual hallucinations. SI, without plan. Denies HI.

## 2022-09-18 NOTE — ED Provider Triage Note (Signed)
Emergency Medicine Provider Triage Evaluation Note  Edwin Henry , a 62 y.o. male  was evaluated in triage.  Pt complains of Audio hallucinations. Chronic x years however recently telling him to harm himself. No having thoughts of SI with plan to walk into traffic. No etoh, illicit substance use  Review of Systems  Positive: Audio hallucinations, SI Negative:   Physical Exam  There were no vitals taken for this visit. Gen:   Awake, no distress   Resp:  Normal effort  MSK:   Moves extremities without difficulty  Other:    Medical Decision Making  Medically screening exam initiated at 3:45 PM.  Appropriate orders placed.  [name] was informed that the remainder of the evaluation will be completed by another provider, this initial triage assessment does not replace that evaluation, and the importance of remaining in the ED until their evaluation is complete.  SI   [name] A, PA-C 09/18/22 1546

## 2022-09-18 NOTE — ED Provider Notes (Addendum)
[place] EMERGENCY DEPARTMENT AT [facility] Provider Note   CSN: [id number] Arrival date & time: 09/18/22  1531     History  Chief Complaint  Patient presents with   Hallucinations    Edwin Henry is a 62 y.o. male  Pt complains of Audio hallucinations. Chronic x years however recently changing, telling him to harm himself. Having thoughts of SI with plan to walk into traffic. No illicit substance use. States he is compliant with psych meds. No recent falls or injuries. Occ etoh use, no hx of DT, WD seizures. Not sleeping at night due to out of home sleeping meds.  HPI     Home Medications Prior to Admission medications   Medication Sig Start Date End Date Taking? Authorizing Provider  benztropine (COGENTIN) 1 MG tablet Take 1 mg by mouth at bedtime.    [provider]  feeding supplement (ENSURE ENLIVE / ENSURE PLUS) LIQD Take 237 mLs by mouth 3 (three) times daily between meals. 05/16/22   Lenard Lance, FNP  gabapentin (NEURONTIN) 100 MG capsule Take 100 mg by mouth 2 (two) times daily.    [provider]  Melatonin 10 MG CAPS Take 10 mg by mouth at bedtime.    [provider]  mirtazapine (REMERON) 30 MG tablet Take 30 mg by mouth at bedtime.    [provider]  OLANZapine (ZYPREXA) 5 MG tablet Take 5 mg by mouth at bedtime.    [provider]  OLANZapine (ZYPREXA) 7.5 MG tablet Take 1 tablet (7.5 mg total) by mouth at bedtime. 05/16/22   Lenard Lance, FNP  QUEtiapine (SEROQUEL) 400 MG tablet Take 2 tablets (800 mg total) by mouth at bedtime. Patient taking differently: Take 400 mg by mouth at bedtime. 05/16/22   Lenard Lance, FNP      Allergies    Patient has no known allergies.    Review of Systems   Review of Systems  Constitutional: Negative.   HENT: Negative.    Respiratory: Negative.    Cardiovascular: Negative.   Gastrointestinal: Negative.   Musculoskeletal: Negative.   Psychiatric/Behavioral:  Positive  for hallucinations, sleep disturbance and suicidal ideas. Negative for agitation, behavioral problems, confusion, decreased concentration, dysphoric mood and self-injury. The patient is not nervous/anxious and is not hyperactive.   All other systems reviewed and are negative.   Physical Exam Updated Vital Signs BP (!) 137/100   Pulse (!) 106   Temp 98 F (36.7 C)   Resp 18   Ht 6\' 2"  (1.88 m)   Wt 76.7 kg   SpO2 99%   BMI 21.71 kg/m  Physical Exam Vitals and nursing note reviewed.  Constitutional:      General: He is not in acute distress.    Appearance: He is well-developed. He is not ill-appearing or diaphoretic.  HENT:     Head: Atraumatic.  Eyes:     Pupils: Pupils are equal, round, and reactive to light.  Cardiovascular:     Rate and Rhythm: Normal rate and regular rhythm.  Pulmonary:     Effort: Pulmonary effort is normal. No respiratory distress.  Abdominal:     General: There is no distension.     Palpations: Abdomen is soft.  Musculoskeletal:        General: Normal range of motion.     Cervical back: Normal range of motion and neck supple.  Skin:    General: Skin is warm and dry.  Neurological:  General: No focal deficit present.     Mental Status: He is alert and oriented to person, place, and time.  Psychiatric:        Attention and Perception: He is inattentive. He perceives auditory hallucinations.        Mood and Affect: Affect normal.        Speech: Speech normal.        Behavior: Behavior is cooperative.        Thought Content: Thought content is not paranoid or delusional. Thought content includes suicidal ideation. Thought content does not include homicidal ideation. Thought content includes suicidal plan. Thought content does not include homicidal plan.     ED Results / Procedures / Treatments   Labs (all labs ordered are listed, but only abnormal results are displayed) Labs Reviewed  COMPREHENSIVE METABOLIC PANEL - Abnormal; Notable for the  following components:      Result Value   Total Protein 8.3 (*)    All other components within normal limits  ETHANOL - Abnormal; Notable for the following components:   Alcohol, Ethyl (B) 204 (*)    All other components within normal limits  SALICYLATE LEVEL - Abnormal; Notable for the following components:   Salicylate Lvl <7.0 (*)    All other components within normal limits  ACETAMINOPHEN LEVEL - Abnormal; Notable for the following components:   Acetaminophen (Tylenol), Serum <10 (*)    All other components within normal limits  CBC WITH DIFFERENTIAL/PLATELET  RAPID URINE DRUG SCREEN, HOSP PERFORMED    EKG None  Radiology No results found.  Procedures Procedures    Medications Ordered in ED Medications  benztropine (COGENTIN) tablet 1 mg (has no administration in time range)  QUEtiapine (SEROQUEL) tablet 400 mg (has no administration in time range)  mirtazapine (REMERON) tablet 30 mg (has no administration in time range)  gabapentin (NEURONTIN) capsule 100 mg (has no administration in time range)    ED Course/ Medical Decision Making/ A&P   62 year old hs schizophrenia here for hallucinations. Has some chronic hallucinations as baseline however recently worse. Telling him to harm himself. Denies etoh use, illicet substance use. Denies pain. No hx of DT, Wd seizures, OTC medication use, trauma, injury. Eating and drinking normal.  Labs personally viewed and interpreted:  Ethanol elevated however pt ambulatory without difficulty, without slurred speech, tolerating PO intake. CBC without significant findings CMP without significant findings  Patient medically cleared. Dispo per Psychiatry                                Medical Decision Making Amount and/or Complexity of Data Reviewed Independent Historian: friend External Data Reviewed: labs, radiology and notes. Labs: ordered. Decision-making details documented in ED Course.  Risk OTC drugs. Prescription drug  management. Decision regarding hospitalization. Diagnosis or treatment significantly limited by social determinants of health.     Final Clinical Impression(s) / ED Diagnoses Final diagnoses:  Hallucinations  Suicidal ideation    Rx / DC Orders ED Discharge Orders     None          Cecely Rengel A, PA-C 09/18/22 1953    Rozelle Logan, DO 09/18/22 2044

## 2022-09-18 NOTE — ED Notes (Signed)
Pt refusing to provide urine sample on request. States "I'm not going to do it. I refuse to do it." Pt is calm at this time.

## 2022-09-18 NOTE — ED Notes (Signed)
Pt stuff have been moved to the cabinet in the 19-22 [place] [address] cabinet

## 2022-09-19 NOTE — ED Notes (Signed)
Patient said he is not suicidal anymore and wants to leave. I told him that he needs to wait for the psychiatrist to see him and reevaluate him. Julieanne Cotton, NP notified.

## 2022-09-19 NOTE — ED Notes (Signed)
Pt refused to give urine sample, nurse notified.

## 2022-09-19 NOTE — ED Notes (Addendum)
Patient walked out of department. He said he has important things to do at home like paying his bills. Patient denies feeling suicidal today. Patient understands that he is leaving against medical advice. He said if he feels suicidal he will call 911.

## 2022-10-04 ENCOUNTER — Ambulatory Visit: Payer: MEDICAID | Admitting: Pulmonary Disease

## 2022-10-15 ENCOUNTER — Ambulatory Visit (HOSPITAL_BASED_OUTPATIENT_CLINIC_OR_DEPARTMENT_OTHER): Payer: MEDICAID | Admitting: Pulmonary Disease

## 2022-10-18 ENCOUNTER — Ambulatory Visit (HOSPITAL_BASED_OUTPATIENT_CLINIC_OR_DEPARTMENT_OTHER): Payer: MEDICAID | Attending: Nurse Practitioner | Admitting: Pulmonary Disease

## 2022-10-18 VITALS — Ht 74.0 in | Wt 170.0 lb

## 2022-10-18 DIAGNOSIS — R0683 Snoring: Secondary | ICD-10-CM | POA: Diagnosis not present

## 2022-10-18 DIAGNOSIS — F5104 Psychophysiologic insomnia: Secondary | ICD-10-CM

## 2022-10-18 DIAGNOSIS — R443 Hallucinations, unspecified: Secondary | ICD-10-CM

## 2022-10-18 DIAGNOSIS — I10 Essential (primary) hypertension: Secondary | ICD-10-CM | POA: Insufficient documentation

## 2022-10-18 DIAGNOSIS — G4719 Other hypersomnia: Secondary | ICD-10-CM

## 2022-10-18 DIAGNOSIS — R519 Headache, unspecified: Secondary | ICD-10-CM | POA: Insufficient documentation

## 2022-10-18 DIAGNOSIS — G4733 Obstructive sleep apnea (adult) (pediatric): Secondary | ICD-10-CM | POA: Insufficient documentation

## 2022-11-03 ENCOUNTER — Telehealth: Payer: Self-pay | Admitting: Pulmonary Disease

## 2022-11-03 DIAGNOSIS — G4719 Other hypersomnia: Secondary | ICD-10-CM | POA: Diagnosis not present

## 2022-11-03 DIAGNOSIS — G4733 Obstructive sleep apnea (adult) (pediatric): Secondary | ICD-10-CM

## 2022-11-03 NOTE — Telephone Encounter (Signed)
Call patient  Sleep study result  Date of study: 10/18/2022  Impression: Moderate obstructive sleep apnea  Recommendation: DME referral  Auto CPAP 5-20 with a C-Flex of 2 with heated humidification with patient's mask of choice,Resmed airfit F20 size medium  Encourage weight loss measures  Follow-up in the office 4 to 6 weeks following initiation of treatment

## 2022-11-03 NOTE — Progress Notes (Signed)
POLYSOMNOGRAPHY  Last, First: Edwin Henry, Edwin Henry MRN: [id number] Gender: Male Age (years): 62 Weight (lbs): 170 DOB: [birth year] BMI: 22 Primary Care: No PCP Epworth Score: 0 Referring: Noemi Chapel NP Technician: Lowry Ram Interpreting: Tomma Lightning MD Study Type: Split Night CPAP Ordered Study Type: Split Night CPAP Study date: 10/18/2022 Location: [place] CLINICAL INFORMATION Edwin Henry is a 62 year old Male and was referred to the sleep center for evaluation of G47.80 Other Sleep Disorders. Indications include Excessive Daytime Sleepiness, Hypertension, Morning Headaches, OSA, Snoring.  MEDICATIONS Patient self administered medications include: BENZTROPINE, GABAPENTIN, MELATONIN, MIRTAZAPINE, Oianzapine, QUETIAPINE FUMARATE. Medications administered during study include No sleep medicine administered.  SLEEP STUDY TECHNIQUE The patient underwent an attended overnight level one polysomnography titration to assess the effects of CPAP therapy. The following variables were monitored: EEG (C4-A1, C3-A2, O1-A2, O2-A1), EOG, submental and leg EMG, ECG, oxyhemoglobin saturation by pulse oximetry, thoracic and abdominal respiratory effort belts, nasal/oral airflow by pressure sensor, body position sensor and snoring sensor. CPAP pressure was titrated to eliminate apneas, hypopneas and oxygen desaturation. Hypopneas were scored per AASM definition IB (4% desaturation)  The NPSG portion of the study ended at 1:00:41 AM . The CPAP titration was initiated at 1:05:56 AM AM with the CPAP portion of the study ending at 5:10:54 AM.  TECHNICIAN COMMENTS Comments added by Technician: None Comments added by Scorer: N/A SLEEP ARCHITECTURE The recording time for the entire night was 421.6 minutes. The diagnostic portion was initiated at 10:09:15 PM and terminated at 1:00:41 AM. The time in bed was 171.4 minutes. EEG confirmed total sleep time was 129.2 minutes yielding a sleep  efficiency of 75.4%. Sleep onset after lights out was 28.2 minutes with a REM latency of N/A minutes. The patient spent 2.7% of the night in stage N1 sleep, 97.3% in stage N2 sleep, 0.0% in stage N3 and 0% in REM. The Arousal Index was 47.4/hour.  The titration portion was initiated at 1:05:56 AM and terminated at 5:10:54 AM. The time in bed was 245.0 minutes. EEG confirmed total sleep time was 236.0 minutes yielding a sleep efficiency of 96.4%. Sleep onset after CPAP initiation was 0.0 minutes with a REM latency of 33.5 minutes. The patient spent 1.9% of the night in stage N1 sleep, 75.4% in stage N2 sleep, 0.0% in stage N3 and 22.7% in REM. The Arousal Index was 13.2/hour. RESPIRATORY PARAMETERS During the diagnostic portion, there were a total of 92 respiratory disturbances recorded; 16 apneas ( 16 obstructive, 0 mixed, 0 central), 45 hypopneas and 31 RERAs. The apnea/hypopnea index 28.3 was events/hour and the RDI was 42.7 events/hour. The central sleep apnea index was 0.0 events/hour. The REM AHI was N/A/h and NREM AHI was 28.3/h. The REM RDI was 0.0/h and NREM RDI was 42.7/h. The supine AHI was 34.4/h, and the non supine AHI was 2.4/h; supine during 80.9% of sleep. The supine RDI was 52.2 /h, and the non supine RDI was 2.43/h. Respiratory disturbances were associated with oxygen desaturation down to a nadir of 91.0% during sleep. The mean oxygen saturation during the study was 96.1%. The cumulative time under 88% oxygen saturation was 0 minutes.  During the titration portion, the apnea/hypopnea index (AHI) was 27.5 events/hour and the RDI was 28.0 events/hour. The central sleep apnea index was events/hour. The most appropriate setting of CPAP was not determined. Respiratory events persisted at all settings. Supplemental oxygen was not administered during the study. LEG MOVEMENT DATA The periodic limb movement index was 0.0/hour with an  associated arousal index of /hour. CARDIAC DATA The underlying  cardiac rhythm was most consistent with sinus rhythm. Mean heart rate was 69.3 during diagnostic portion and 73.0 during titration portion of study. Additional rhythm abnormalities include None.  IMPRESSIONS - Moderate Obstructive Sleep apnea(OSA) Sub-Optimal pressure attained. - EKG showed no cardiac abnormalities. - No significant Oxygen Desaturation - The patient snored with moderate snoring volume. - EEG did not show alpha intrusion. - No significant periodic leg movements(PLMs) during sleep. However, no significant associated arousals. - Reduced sleep efficiency, long primary sleep latency, long REM sleep latency and no slow wave latency.  DIAGNOSIS - Obstructive Sleep Apnea (G47.33)  RECOMMENDATIONS - Auto CPAP 5-20 with a C-Flex of 2 with heated humidification with patient's mask of choice,Resmed airfit F20 size medium - Avoid alcohol, sedatives and other CNS depressants that may worsen sleep apnea and disrupt normal sleep architecture. - Sleep hygiene should be reviewed to assess factors that may improve sleep quality. - Weight management and regular exercise should be initiated or continued. - Return to Sleep Center for re-evaluation after 4 weeks of therapy  [Electronically signed] 11/03/2022 04:41 AM  Virl Diamond MD NPI: [id number]

## 2022-11-03 NOTE — Procedures (Signed)
POLYSOMNOGRAPHY  Last, First: Edwin Henry, Edwin Henry MRN: [id number] Gender: Male Age (years): 42 Weight (lbs): 170 DOB: [birth year] BMI: 22 Primary Care: No PCP Epworth Score: 0 Referring: Noemi Chapel NP Technician: Lowry Ram Interpreting: Tomma Lightning MD Study Type: Split Night CPAP Ordered Study Type: Split Night CPAP Study date: 10/18/2022 Location: [place] CLINICAL INFORMATION Edwin Henry is a 62 year old Male and was referred to the sleep center for evaluation of G47.80 Other Sleep Disorders. Indications include Excessive Daytime Sleepiness, Hypertension, Morning Headaches, OSA, Snoring.  MEDICATIONS Patient self administered medications include: BENZTROPINE, GABAPENTIN, MELATONIN, MIRTAZAPINE, Oianzapine, QUETIAPINE FUMARATE. Medications administered during study include No sleep medicine administered.  SLEEP STUDY TECHNIQUE The patient underwent an attended overnight level one polysomnography titration to assess the effects of CPAP therapy. The following variables were monitored: EEG (C4-A1, C3-A2, O1-A2, O2-A1), EOG, submental and leg EMG, ECG, oxyhemoglobin saturation by pulse oximetry, thoracic and abdominal respiratory effort belts, nasal/oral airflow by pressure sensor, body position sensor and snoring sensor. CPAP pressure was titrated to eliminate apneas, hypopneas and oxygen desaturation. Hypopneas were scored per AASM definition IB (4% desaturation)  The NPSG portion of the study ended at 1:00:41 AM . The CPAP titration was initiated at 1:05:56 AM AM with the CPAP portion of the study ending at 5:10:54 AM.  TECHNICIAN COMMENTS Comments added by Technician: None Comments added by Scorer: N/A SLEEP ARCHITECTURE The recording time for the entire night was 421.6 minutes. The diagnostic portion was initiated at 10:09:15 PM and terminated at 1:00:41 AM. The time in bed was 171.4 minutes. EEG confirmed total sleep time was 129.2 minutes yielding a sleep  efficiency of 75.4%. Sleep onset after lights out was 28.2 minutes with a REM latency of N/A minutes. The patient spent 2.7% of the night in stage N1 sleep, 97.3% in stage N2 sleep, 0.0% in stage N3 and 0% in REM. The Arousal Index was 47.4/hour.  The titration portion was initiated at 1:05:56 AM and terminated at 5:10:54 AM. The time in bed was 245.0 minutes. EEG confirmed total sleep time was 236.0 minutes yielding a sleep efficiency of 96.4%. Sleep onset after CPAP initiation was 0.0 minutes with a REM latency of 33.5 minutes. The patient spent 1.9% of the night in stage N1 sleep, 75.4% in stage N2 sleep, 0.0% in stage N3 and 22.7% in REM. The Arousal Index was 13.2/hour. RESPIRATORY PARAMETERS During the diagnostic portion, there were a total of 92 respiratory disturbances recorded; 16 apneas ( 16 obstructive, 0 mixed, 0 central), 45 hypopneas and 31 RERAs. The apnea/hypopnea index 28.3 was events/hour and the RDI was 42.7 events/hour. The central sleep apnea index was 0.0 events/hour. The REM AHI was N/A/h and NREM AHI was 28.3/h. The REM RDI was 0.0/h and NREM RDI was 42.7/h. The supine AHI was 34.4/h, and the non supine AHI was 2.4/h; supine during 80.9% of sleep. The supine RDI was 52.2 /h, and the non supine RDI was 2.43/h. Respiratory disturbances were associated with oxygen desaturation down to a nadir of 91.0% during sleep. The mean oxygen saturation during the study was 96.1%. The cumulative time under 88% oxygen saturation was 0 minutes.  During the titration portion, the apnea/hypopnea index (AHI) was 27.5 events/hour and the RDI was 28.0 events/hour. The central sleep apnea index was events/hour. The most appropriate setting of CPAP was not determined. Respiratory events persisted at all settings. Supplemental oxygen was not administered during the study. LEG MOVEMENT DATA The periodic limb movement index was 0.0/hour with an  associated arousal index of /hour. CARDIAC DATA The underlying  cardiac rhythm was most consistent with sinus rhythm. Mean heart rate was 69.3 during diagnostic portion and 73.0 during titration portion of study. Additional rhythm abnormalities include None.  IMPRESSIONS - Moderate Obstructive Sleep apnea(OSA) Sub-Optimal pressure attained. - EKG showed no cardiac abnormalities. - No significant Oxygen Desaturation - The patient snored with moderate snoring volume. - EEG did not show alpha intrusion. - No significant periodic leg movements(PLMs) during sleep. However, no significant associated arousals. - Reduced sleep efficiency, long primary sleep latency, long REM sleep latency and no slow wave latency.  DIAGNOSIS - Obstructive Sleep Apnea (G47.33)  RECOMMENDATIONS - Auto CPAP 5-20 with a C-Flex of 2 with heated humidification with patient's mask of choice,Resmed airfit F20 size medium - Avoid alcohol, sedatives and other CNS depressants that may worsen sleep apnea and disrupt normal sleep architecture. - Sleep hygiene should be reviewed to assess factors that may improve sleep quality. - Weight management and regular exercise should be initiated or continued. - Return to Sleep Center for re-evaluation after 4 weeks of therapy  [Electronically signed] 11/03/2022 04:41 AM  Virl Diamond MD NPI: [id number]

## 2022-11-08 NOTE — Telephone Encounter (Signed)
Spoke with patient regarding sleep study result's   Sleep study result   Date of study: 10/18/2022   Impression: Moderate obstructive sleep apnea   Recommendation: DME referral   Auto CPAP 5-20 with a C-Flex of 2 with heated humidification with patient's mask of choice,Resmed airfit F20 size medium   Encourage weight loss measures   Follow-up in the office 4 to 6 weeks following initiation of treatment   Advised patient I will put a order in for CPAP . Patient's voice was understanding. Nothing else further needed.

## 2023-01-19 ENCOUNTER — Other Ambulatory Visit: Payer: Self-pay

## 2023-01-19 ENCOUNTER — Emergency Department (HOSPITAL_COMMUNITY)
Admission: EM | Admit: 2023-01-19 | Discharge: 2023-01-20 | Disposition: A | Discharge status: Home / Self Care | Payer: MEDICAID | Attending: Emergency Medicine | Admitting: Emergency Medicine

## 2023-01-19 ENCOUNTER — Encounter (HOSPITAL_COMMUNITY): Payer: Self-pay

## 2023-01-19 DIAGNOSIS — R443 Hallucinations, unspecified: Secondary | ICD-10-CM | POA: Insufficient documentation

## 2023-01-19 DIAGNOSIS — I10 Essential (primary) hypertension: Secondary | ICD-10-CM | POA: Insufficient documentation

## 2023-01-19 DIAGNOSIS — R45851 Suicidal ideations: Secondary | ICD-10-CM

## 2023-01-19 DIAGNOSIS — R4689 Other symptoms and signs involving appearance and behavior: Secondary | ICD-10-CM | POA: Diagnosis present

## 2023-01-19 DIAGNOSIS — F17210 Nicotine dependence, cigarettes, uncomplicated: Secondary | ICD-10-CM | POA: Diagnosis not present

## 2023-01-19 DIAGNOSIS — F251 Schizoaffective disorder, depressive type: Secondary | ICD-10-CM | POA: Diagnosis present

## 2023-01-19 LAB — ETHANOL: Alcohol, Ethyl (B): 136 mg/dL — ABNORMAL HIGH (ref <10)

## 2023-01-19 LAB — COMPREHENSIVE METABOLIC PANEL
ALT: 26 U/L (ref 0–44)
AST: 24 U/L (ref 15–41)
Albumin: 4.2 g/dL (ref 3.5–5.0)
Alkaline Phosphatase: 93 U/L (ref 38–126)
Anion gap: 13 (ref 5–15)
BUN: 10 mg/dL (ref 8–23)
CO2: 17 mmol/L — ABNORMAL LOW (ref 22–32)
Calcium: 8.9 mg/dL (ref 8.9–10.3)
Chloride: 100 mmol/L (ref 98–111)
Creatinine, Ser: 0.73 mg/dL (ref 0.61–1.24)
GFR, Estimated: >60 mL/min (ref >60)
Glucose, Bld: 99 mg/dL (ref 70–99)
Potassium: 3.8 mmol/L (ref 3.5–5.1)
Sodium: 130 mmol/L — ABNORMAL LOW (ref 135–145)
Total Bilirubin: 0.6 mg/dL (ref <1.2)
Total Protein: 8.1 g/dL (ref 6.5–8.1)

## 2023-01-19 LAB — CBC
HCT: 49.1 % (ref 39.0–52.0)
Hemoglobin: 17.3 g/dL — ABNORMAL HIGH (ref 13.0–17.0)
MCH: 32.2 pg (ref 26.0–34.0)
MCHC: 35.2 g/dL (ref 30.0–36.0)
MCV: 91.3 fL (ref 80.0–100.0)
Platelets: 263 10*3/uL (ref 150–400)
RBC: 5.38 MIL/uL (ref 4.22–5.81)
RDW: 14.6 % (ref 11.5–15.5)
WBC: 8 10*3/uL (ref 4.0–10.5)
nRBC: 0 % (ref 0.0–0.2)

## 2023-01-19 LAB — RAPID URINE DRUG SCREEN, HOSP PERFORMED
Amphetamines: NOT DETECTED
Barbiturates: NOT DETECTED
Benzodiazepines: NOT DETECTED
Cocaine: NOT DETECTED
Opiates: NOT DETECTED
Tetrahydrocannabinol: NOT DETECTED

## 2023-01-19 LAB — ACETAMINOPHEN LEVEL: Acetaminophen (Tylenol), Serum: <10 ug/mL — ABNORMAL LOW (ref 10–30)

## 2023-01-19 LAB — SALICYLATE LEVEL: Salicylate Lvl: <7 mg/dL — ABNORMAL LOW (ref 7.0–30.0)

## 2023-01-19 MED ORDER — LORAZEPAM 1 MG PO TABS
1.0000 mg | ORAL_TABLET | Freq: Every day | ORAL | Status: DC
  Administered 2023-01-19: 1 mg via ORAL
  Filled 2023-01-19: qty 1

## 2023-01-19 NOTE — ED Provider Notes (Signed)
[place] EMERGENCY DEPARTMENT AT [facility] Provider Note  CSN: [id number] Arrival date & time: 01/19/23 1647  Chief Complaint(s) Suicidal  HPI Edwin Henry is a 62 y.o. male history of schizophrenia, polysubstance abuse presenting to the emergency department with hearing voices.  He reports that lately he has been hearing more voices, telling him to hurt himself.  He reports that they other random stuff.  No visual hallucinations.  He reports that he had some thoughts about wanting to hurt himself but these have improved.  He has no specific plan.  He has no plans to hurt other people.  He denies any medical complaints like fevers or chills, chest pain, shortness of breath, nausea, vomiting, or any other new symptoms.  He does report that he missed some of his doses because he went home to [facility] and did not bring his medications   Past Medical History Past Medical History:  Diagnosis Date   Hypertension    Insomnia disorder    Schizophrenia [facility])    Patient Active Problem List   Diagnosis Date Noted   Excessive daytime sleepiness 10/18/2022   Suicide ideation 09/18/2022   Loud snoring 08/25/2022   Insomnia disorder 05/15/2022   Cocaine use disorder, mild, abuse (HCC) 08/03/2021   Cannabis use disorder, mild, abuse 08/03/2021   Drug-induced parkinsonism (HCC) 12/15/2020   Schizoaffective disorder, depressive type (HCC) 12/15/2020   Home Medication(s) Prior to Admission medications   Medication Sig Start Date End Date Taking? Authorizing Provider  benztropine (COGENTIN) 1 MG tablet Take 1 mg by mouth at bedtime.    [provider]  feeding supplement (ENSURE ENLIVE / ENSURE PLUS) LIQD Take 237 mLs by mouth 3 (three) times daily between meals. 05/16/22   Lenard Lance, FNP  gabapentin (NEURONTIN) 100 MG capsule Take 100 mg by mouth 2 (two) times daily.    [provider]  Melatonin 10 MG CAPS Take 10 mg by mouth at bedtime.    [provider]  mirtazapine (REMERON) 30 MG tablet Take 30 mg by mouth at bedtime.    [provider]  OLANZapine (ZYPREXA) 5 MG tablet Take 5 mg by mouth at bedtime.    [provider]  OLANZapine (ZYPREXA) 7.5 MG tablet Take 1 tablet (7.5 mg total) by mouth at bedtime. 05/16/22   Lenard Lance, FNP  QUEtiapine (SEROQUEL) 400 MG tablet Take 2 tablets (800 mg total) by mouth at bedtime. Patient taking differently: Take 400 mg by mouth at bedtime. 05/16/22   Lenard Lance, FNP                                                                                                                                    Past Surgical History Past Surgical History:  Procedure Laterality Date   KNEE SURGERY Right    Family History History reviewed. No pertinent family history.  Social History Social History  Tobacco Use   Smoking status: Every Day    Current packs/day: 0.00    Types: Cigars, Cigarettes    Last attempt to quit: 11/07/2019    Years since quitting: 3.2   Smokeless tobacco: Current    Types: Chew  Vaping Use   Vaping status: Never Used  Substance Use Topics   Alcohol use: Yes    Comment: 1-2 beers every 2 days   Drug use: Not Currently    Types: Marijuana, Cocaine    Comment: occasional THC use, 12/15/20 not now   Allergies Patient has no known allergies.  Review of Systems Review of Systems  All other systems reviewed and are negative.   Physical Exam Vital Signs  I have reviewed the triage vital signs BP 102/88 (BP Location: Right Arm)   Pulse (!) 105   Temp 98.1 F (36.7 C) (Oral)   Resp 16   Ht 6\' 2"  (1.88 m)   Wt 77.1 kg   SpO2 97%   BMI 21.82 kg/m  Physical Exam Vitals and nursing note reviewed.  Constitutional:      General: He is not in acute distress.    Appearance: Normal appearance.  HENT:     Mouth/Throat:     Mouth: Mucous membranes are moist.  Eyes:     Conjunctiva/sclera: Conjunctivae normal.  Cardiovascular:     Rate and  Rhythm: Normal rate and regular rhythm.  Pulmonary:     Effort: Pulmonary effort is normal. No respiratory distress.     Breath sounds: Normal breath sounds.  Abdominal:     General: Abdomen is flat.     Palpations: Abdomen is soft.     Tenderness: There is no abdominal tenderness.  Musculoskeletal:     Right lower leg: No edema.     Left lower leg: No edema.  Skin:    General: Skin is warm and dry.     Capillary Refill: Capillary refill takes less than 2 seconds.  Neurological:     Mental Status: He is alert and oriented to person, place, and time. Mental status is at baseline.  Psychiatric:        Mood and Affect: Mood normal.        Behavior: Behavior normal.     Comments: Attends to conversation, linear train of thought     ED Results and Treatments Labs (all labs ordered are listed, but only abnormal results are displayed) Labs Reviewed  COMPREHENSIVE METABOLIC PANEL - Abnormal; Notable for the following components:      Result Value   Sodium 130 (*)    CO2 17 (*)    All other components within normal limits  ETHANOL - Abnormal; Notable for the following components:   Alcohol, Ethyl (B) 136 (*)    All other components within normal limits  SALICYLATE LEVEL - Abnormal; Notable for the following components:   Salicylate Lvl <7.0 (*)    All other components within normal limits  ACETAMINOPHEN LEVEL - Abnormal; Notable for the following components:   Acetaminophen (Tylenol), Serum <10 (*)    All other components within normal limits  CBC - Abnormal; Notable for the following components:   Hemoglobin 17.3 (*)    All other components within normal limits  RAPID URINE DRUG SCREEN, HOSP PERFORMED  Radiology No results found.  Pertinent labs & imaging results that were available during my care of the patient were reviewed by me and considered in my  medical decision making (see MDM for details).  Medications Ordered in ED Medications  LORazepam (ATIVAN) tablet 1 mg (has no administration in time range)                                                                                                                                     Procedures Procedures  (including critical care time)  Medical Decision Making / ED Course   MDM:  62 year old male presenting to the emergency department hallucinations.  Patient overall very well-appearing, physical examination reassuring with no focal abnormalities.  He does appear to have a linear train of thought.  He reports that earlier he had some passive suicidal ideation however this is improved.  He denies any active suicidal ideation.  Does not meet criteria for involuntary hold.  Currently the main thing bothering him is that he is having hallucinations that are telling him to harm himself.  He does not seem significantly psychotic as he is able to carry on a conversation without reacting to external stimuli.  However given his worsening psychiatric symptoms we will consult psychiatry.  His laboratory testing is reassuring, his alcohol level is slightly elevated but he does not seem clinically intoxicated.  He is medically cleared for psychiatric evaluation.        Lab Tests: -I ordered, reviewed, and interpreted labs.   The pertinent results include:   Labs Reviewed  COMPREHENSIVE METABOLIC PANEL - Abnormal; Notable for the following components:      Result Value   Sodium 130 (*)    CO2 17 (*)    All other components within normal limits  ETHANOL - Abnormal; Notable for the following components:   Alcohol, Ethyl (B) 136 (*)    All other components within normal limits  SALICYLATE LEVEL - Abnormal; Notable for the following components:   Salicylate Lvl <7.0 (*)    All other components within normal limits  ACETAMINOPHEN LEVEL - Abnormal; Notable for the following components:    Acetaminophen (Tylenol), Serum <10 (*)    All other components within normal limits  CBC - Abnormal; Notable for the following components:   Hemoglobin 17.3 (*)    All other components within normal limits  RAPID URINE DRUG SCREEN, HOSP PERFORMED    Notable for elevated alcohol level    Medicines ordered and prescription drug management: Meds ordered this encounter  Medications   LORazepam (ATIVAN) tablet 1 mg    -I have reviewed the patients home medicines and have made adjustments as needed  Social Determinants of Health:  Diagnosis or treatment significantly limited by social determinants of health: alcohol use and polysubstance abuse   Co morbidities that complicate the patient evaluation  Past Medical History:  Diagnosis Date   Hypertension  Insomnia disorder    Schizophrenia (HCC)       Dispostion: Disposition decision including need for hospitalization was considered, and patient boarding for psychiatric evaluation     Final Clinical Impression(s) / ED Diagnoses Final diagnoses:  Hallucination     This chart was dictated using voice recognition software.  Despite best efforts to proofread,  errors can occur which can change the documentation meaning.    Lonell Grandchild, MD 01/19/23 [phone]

## 2023-01-19 NOTE — ED Notes (Signed)
Pt constantly asks about leaving and when the doctor can talk to him. ED Provider Scheving at bedside to address pt concerns

## 2023-01-19 NOTE — ED Triage Notes (Signed)
Pt reports suicidal ideation. No plan. Has not attempt self harm.  Denies HI or hallucinations. Patient denies any other complaints. Calm/cooperative.

## 2023-01-20 MED ORDER — HYDROXYZINE HCL 25 MG PO TABS
25.0000 mg | ORAL_TABLET | Freq: Three times a day (TID) | ORAL | Status: DC | PRN
  Administered 2023-01-20: 25 mg via ORAL
  Filled 2023-01-20: qty 1

## 2023-01-20 MED ORDER — GABAPENTIN 100 MG PO CAPS
100.0000 mg | ORAL_CAPSULE | Freq: Two times a day (BID) | ORAL | Status: DC
  Administered 2023-01-20: 100 mg via ORAL
  Filled 2023-01-20: qty 1

## 2023-01-20 NOTE — ED Provider Notes (Addendum)
Emergency Medicine Observation Re-evaluation Note  Edwin Henry is a 62 y.o. male, seen on rounds today.  Pt initially presented to the ED for complaints of Suicidal Currently, the patient is sleeping.  Physical Exam  BP 125/85 (BP Location: Left Arm)   Pulse (!) 105   Temp 98.1 F (36.7 C) (Oral)   Resp 20   Ht 6\' 2"  (1.88 m)   Wt 77.1 kg   SpO2 100%   BMI 21.82 kg/m  Physical Exam General: No acute distress Lungs: No respiratory distress Psych: Sleeping  ED Course / MDM  EKG:   I have reviewed the labs performed to date as well as medications administered while in observation.  Recent changes in the last 24 hours include the patient is here and is medically cleared awaiting psychiatric evaluation.  Plan  Current plan is for psychiatric evaluation to determine safe disposition.  I was contacted by our psych crisis team who evaluated the patient and think that he is safe for discharge.  He is discharged with return precautions.    Durwin Glaze, MD 01/20/23 1610    Durwin Glaze, MD 01/20/23 [phone]

## 2023-01-20 NOTE — Consult Note (Signed)
[facility] Psychiatric Consult Initial  Patient Name: .Edwin Henry  MRN: [id number]  DOB: [birth year]  Consult Order details:  Orders (From admission, onward)     Start     Ordered   01/19/23 2249  CONSULT TO CALL ACT TEAM       Ordering Provider: Lonell Grandchild, MD  Provider:  (Not yet assigned)  Question:  Reason for Consult?  Answer:  Psych consult   01/19/23 2248             Mode of Visit: Tele-visit Virtual Statement:TELE PSYCHIATRY ATTESTATION & CONSENT As the provider for this telehealth consult, I attest that I verified the patient's identity using two separate identifiers, introduced myself to the patient, provided my credentials, disclosed my location, and performed this encounter via a HIPAA-compliant, real-time, face-to-face, two-way, interactive audio and video platform and with the full consent and agreement of the patient (or guardian as applicable.) Patient physical location: [facility]. Telehealth provider physical location: home office in state of [place].   Video start time: 1100 Video end time: 1140.    Psychiatry Consult Evaluation  Service Date: January 20, 2023 LOS:  LOS: 0 days  Chief Complaint suicidal ideation. No plan. Has not attempt self harm.   Primary Psychiatric Diagnoses  Schizoaffective Disorder 2.   GAD 3.   Suicide Ideation  Assessment  Edwin Henry is a 62 y.o. male admitted: Presented to the ED for 01/19/2023  4:53 PM for suicidal ideation. No plan. Has not attempt self harm. He carries the psychiatric diagnoses of Schizoaffective Disorder, and MDD.   His current presentation of auditory hallucinations is most consistent with schizoaffective disorder. Patient will be psychiatrically cleared. Current outpatient psychotropic medications include Cogentin, Remeron, Zyprexa, and Seroquel and historically he has had a positive response to these medications. Patient states he is compliant with his medications. Please see plan below for detailed  recommendations.   Diagnoses:  Active Hospital problems: Active Problems:   * No active hospital problems. *    Plan   ## Psychiatric Medication Recommendations:  Continue patient home medications  ## Medical Decision Making Capacity:  Patient is his own legal guardian.  ## Further Work-up:  EKG -- most recent EKG on 06/20/22 had QtC of 434 -- Pertinent labwork reviewed earlier this admission includes: CMP, BNP, UDS, Ethanol level   ## Disposition:-- Plan Post Discharge/Psychiatric Care Follow-up resources Patient psychiatrically cleared.  ## Behavioral / Environmental: -To minimize splitting of staff, assign one staff person to communicate all information from the team when feasible. or Utilize compassion and acknowledge the patient's experiences while setting clear and realistic expectations for care.    ## Safety and Observation Level:  - Based on my clinical evaluation, I estimate the patient to be at low risk of self harm in the current setting. - At this time, we recommend  routine. This decision is based on my review of the chart including patient's history and current presentation, interview of the patient, mental status examination, and consideration of suicide risk including evaluating suicidal ideation, plan, intent, suicidal or self-harm behaviors, risk factors, and protective factors. This judgment is based on our ability to directly address suicide risk, implement suicide prevention strategies, and develop a safety plan while the patient is in the clinical setting. Please contact our team if there is a concern that risk level has changed.  CSSR Risk Category:C-SSRS RISK CATEGORY: Error: Question 6 not populated  Suicide Risk Assessment: Patient has following non-modifiable or demographic risk factors for  suicide: male gender Patient has the following protective factors against suicide: Supportive family, Supportive friends, and no history of suicide attempts  Thank you  for this consult request. Recommendations have been communicated to the primary team.  We will psychiatrically clear patient at this time.   Alona Bene, PMHNP       History of Present Illness  Relevant Aspects of Hospital ED Course:   [name], 62 y.o., male patient seen face to face by this provider, consulted with Dr. Rebecca Eaton; and chart reviewed on 01/27/23.  On evaluation Edwin Henry reports On evaluation today, the patient is sitting on the side of his bed. He is calm and cooperative during this assessment. His appearance is appropriate for environment. His eye contact is good.  Speech is clear and coherent, normal pace and normal volume. He is alert and oriented x4 to person, place, time, and situation. He reports his mood is euthymic.  Affect is congruent with mood.  Thought process is coherent. Thought content is within normal limits.  He denies auditory and visual hallucinations. No indication that he is responding to internal stimuli during this assessment.  No delusions elicited during this assessment. He denies suicidal ideations. He denies homicidal ideations. Patient states he has no complaints with eating and sleeping.  Patient states he was admitted because he was hearing voices to hurt himself. He states he see Dr. Virgina Jock at [facility] and has been going to [facility] for two years but has been missing appointments and medications, but realizes he can not keep doing that. He states he has not been on his medications since Thanksgiving. He lives by himself, but lives near family, who are very helpful and supportive. He currently does not work and receives SSI. Patient denies  using any illicit drugs and BAL is 136,.    At this time Edwin Henry is educated and verbalizes understanding of mental health resources and other crisis services in the community. He is instructed to call 911 and present to the nearest emergency room should he experience any suicidal/homicidal ideation,  auditory/visual/hallucinations, or detrimental worsening of his mental health condition. He was a also advised by Clinical research associate that he could call the toll-free phone on back of  insurance card to assist with identifying in network counselors and agencies or number on back of Medicaid card to speak with care coordinator   Collateral information:  Contacted patent mother Terese Door, she states she does not feel patient is a danger to self or anyone else. She states she lives close to patient and she talks with him everyday. She feels that patient will be safe to return home.   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 He states they will try and get out to patient home within the next day or two.   ROS   Psychiatric and Social History  Psychiatric History:  Information collected from patient   Prev Dx/Sx: MDD Current Psych Provider: [facility] Meds (current): Cogentin, Remeron, Zyprexa, Seroquel Previous Med Trials: Unknown  Therapy: [facility]  Prior Psych Hospitalization: Yes  Prior Self Harm: Patient denies Prior Violence: Patient denies  Family Psych History: Patient unknown  Family Hx suicide: Unknown   Social History:  Developmental Hx: Patient appears to be at appropriate developmental age Educational Hx: High School Occupational Hx: Unemployed  Legal Hx: Patient denies Living  Situation: Patient lives alone Spiritual Hx: None  Access to weapons/lethal means: None    Substance History Alcohol: Yes  Type of alcohol Beer Last Drink 01/19/23 Number of drinks per day 2 (10 oz beers a day) History of alcohol withdrawal seizures Patient denies History of DT's Patient denies Tobacco: Yes Illicit drugs: Patient denies Prescription drug abuse: Patient denies Rehab hx: Patient denies  Exam Findings  Physical Exam:  Vital Signs:  Temp:  [98.1  F (36.7 C)] 98.1 F (36.7 C) (12/14 0403) Pulse Rate:  [105] 105 (12/14 0403) Resp:  [16-20] 20 (12/14 0403) BP: (102-125)/(85-88) 125/85 (12/14 0403) SpO2:  [97 %-100 %] 100 % (12/14 0403) Weight:  [77.1 kg] 77.1 kg (12/13 1742) Blood pressure 125/85, pulse (!) 105, temperature 98.1 F (36.7 C), temperature source Oral, resp. rate 20, height 6\' 2"  (1.88 m), weight 77.1 kg, SpO2 100%. Body mass index is 21.82 kg/m.  Physical Exam Vitals and nursing note reviewed. Exam conducted with a chaperone present.  Neurological:     Mental Status: He is alert.  Psychiatric:        Mood and Affect: Mood normal.        Behavior: Behavior normal.        Thought Content: Thought content normal.        Judgment: Judgment normal.     Mental Status Exam: General Appearance: Casual  Orientation:  Full (Time, Place, and Person)  Memory:  Immediate;   Fair Remote;   Good  Concentration:  Concentration: Good and Attention Span: Good  Recall:  Good  Attention  Fair  Eye Contact:  Good  Speech:  Clear and Coherent  Language:  Good  Volume:  Normal  Mood: euthymic  Affect:  Appropriate  Thought Process:  Coherent  Thought Content:  WDL  Suicidal Thoughts:  No  Homicidal Thoughts:  No  Judgement:  Good  Insight:  Good  Psychomotor Activity:  Normal  Akathisia:  No  Fund of Knowledge:  Fair      Assets:  Manufacturing systems engineer Desire for Improvement Housing Social Support  Cognition:  WNL  ADL's:  Intact  AIMS (if indicated):        Other History   These have been pulled in through the EMR, reviewed, and updated if appropriate.  Family History:  The patient's family history is not on file.  Medical History: Past Medical History:  Diagnosis Date   Hypertension    Insomnia disorder    Schizophrenia (HCC)     Surgical History: Past Surgical History:  Procedure Laterality Date   KNEE SURGERY Right      Medications:   Current Facility-Administered Medications:     gabapentin (NEURONTIN) capsule 100 mg, 100 mg, Oral, BID, Motley-Mangrum, Dontel Harshberger A, PMHNP, 100 mg at 01/20/23 1114   hydrOXYzine (ATARAX) tablet 25 mg, 25 mg, Oral, TID PRN, Motley-Mangrum, Jalene Demo A, PMHNP, 25 mg at 01/20/23 1114   LORazepam (ATIVAN) tablet 1 mg, 1 mg, Oral, QHS, Lonell Grandchild, MD, 1 mg at 01/19/23 2305  Current Outpatient Medications:    benztropine (COGENTIN) 1 MG tablet, Take 1 mg by mouth at bedtime., Disp: , Rfl:    feeding supplement (ENSURE ENLIVE / ENSURE PLUS) LIQD, Take 237 mLs by mouth 3 (three) times daily between meals., Disp: 237 mL, Rfl: 12   gabapentin (NEURONTIN) 100 MG capsule, Take 100 mg by mouth 2 (two) times daily., Disp: , Rfl:    Melatonin 10 MG CAPS, Take 10 mg by mouth  at bedtime., Disp: , Rfl:    mirtazapine (REMERON) 30 MG tablet, Take 30 mg by mouth at bedtime., Disp: , Rfl:    OLANZapine (ZYPREXA) 5 MG tablet, Take 5 mg by mouth at bedtime., Disp: , Rfl:    OLANZapine (ZYPREXA) 7.5 MG tablet, Take 1 tablet (7.5 mg total) by mouth at bedtime., Disp: 30 tablet, Rfl: 0   QUEtiapine (SEROQUEL) 400 MG tablet, Take 2 tablets (800 mg total) by mouth at bedtime. (Patient taking differently: Take 400 mg by mouth at bedtime.), Disp: 60 tablet, Rfl: 0  Allergies: No Known Allergies  Lakhia Gengler MOTLEY-MANGRUM, PMHNP

## 2023-01-20 NOTE — ED Notes (Signed)
Patient discharged off unit to home per provider. Patient alert, calm, cooperative, no s/s of distress at this time. Discharge information given to patient, with acknowledged understanding. Belongings given to patient. Patient ambulatory off unit, escorted by RN. Patient given bus passes for transportation.

## 2023-01-20 NOTE — Discharge Instructions (Addendum)
Discharge recommendations:  Patient is to take medications as prescribed. Please see information for follow-up appointment with psychiatry and therapy. Please follow up with your primary care provider for all medical related needs.   Therapy: We recommend that patient participate in individual therapy to address mental health concerns.  Medications: The patient or guardian is to contact a medical professional and/or outpatient provider to address any new side effects that develop. The patient or guardian should update outpatient providers of any new medications and/or medication changes.   Atypical antipsychotics: If you are prescribed an atypical antipsychotic, it is recommended that your height, weight, BMI, blood pressure, fasting lipid panel, and fasting blood sugar be monitored by your outpatient providers.  Safety:  The patient should abstain from use of illicit substances/drugs and abuse of any medications. If symptoms worsen or do not continue to improve or if the patient becomes actively suicidal or homicidal then it is recommended that the patient return to the closest hospital emergency department, the [facility], or call 911 for further evaluation and treatment. National Suicide Prevention Lifeline 1-800-SUICIDE or 662-436-6824.  About 988 988 offers 24/7 access to trained crisis counselors who can help people experiencing mental health-related distress. People can call or text 988 or chat 988lifeline.org for themselves or if they are worried about a loved one who may need crisis support.  Crisis Mobile: [facility]:                     808-478-4217 (for crisis response 24 hours a day) [facility] Hotline:                                            (551) 347-2326   Safety Plan @NAME @ will reach out to his nurse [place] at [place], call 911 or call mobile crisis, or go to nearest emergency room if condition worsens or if suicidal  thoughts become active Patients' will follow up with [facility] for outpatient psychiatric services (therapy/medication management).  The suicide prevention education provided includes the following: Suicide risk factors Suicide prevention and interventions National Suicide Hotline telephone number [facility] assessment telephone number [facility] Emergency Assistance 911 [facility] and/or Residential Mobile Crisis Unit telephone number Request made of family/significant other to:  nurse [place] at [place], [employer] (e.g., guns, rifles, knives), all items previously/currently identified as safety concern.   Remove drugs/medications (over the counter, prescriptions, illicit drugs), all items previously/currently identified as a safety concern.

## 2023-01-20 NOTE — BH Assessment (Signed)
Pt is currently asleep. TTS will be notified once pt is awake and alert.

## 2023-07-25 ENCOUNTER — Ambulatory Visit (HOSPITAL_COMMUNITY)
Admission: EM | Admit: 2023-07-25 | Discharge: 2023-07-27 | Disposition: A | Discharge status: Home / Self Care | Payer: MEDICAID | Attending: Psychiatry | Admitting: Psychiatry

## 2023-07-25 DIAGNOSIS — F259 Schizoaffective disorder, unspecified: Secondary | ICD-10-CM | POA: Diagnosis not present

## 2023-07-25 DIAGNOSIS — Z760 Encounter for issue of repeat prescription: Secondary | ICD-10-CM | POA: Diagnosis not present

## 2023-07-25 DIAGNOSIS — R440 Auditory hallucinations: Secondary | ICD-10-CM

## 2023-07-25 LAB — CBC WITH DIFFERENTIAL/PLATELET
Abs Immature Granulocytes: 0.03 10*3/uL (ref 0.00–0.07)
Basophils Absolute: 0.1 10*3/uL (ref 0.0–0.1)
Basophils Relative: 1 %
Eosinophils Absolute: 0.1 10*3/uL (ref 0.0–0.5)
Eosinophils Relative: 2 %
HCT: 48.2 % (ref 39.0–52.0)
Hemoglobin: 16.2 g/dL (ref 13.0–17.0)
Immature Granulocytes: 0 %
Lymphocytes Relative: 33 %
Lymphs Abs: 2.6 10*3/uL (ref 0.7–4.0)
MCH: 30.5 pg (ref 26.0–34.0)
MCHC: 33.6 g/dL (ref 30.0–36.0)
MCV: 90.6 fL (ref 80.0–100.0)
Monocytes Absolute: 0.8 10*3/uL (ref 0.1–1.0)
Monocytes Relative: 10 %
Neutro Abs: 4.3 10*3/uL (ref 1.7–7.7)
Neutrophils Relative %: 54 %
Platelets: 330 10*3/uL (ref 150–400)
RBC: 5.32 MIL/uL (ref 4.22–5.81)
RDW: 13.8 % (ref 11.5–15.5)
WBC: 7.9 10*3/uL (ref 4.0–10.5)
nRBC: 0 % (ref 0.0–0.2)

## 2023-07-25 LAB — COMPREHENSIVE METABOLIC PANEL WITH GFR
ALT: 21 U/L (ref 0–44)
AST: 19 U/L (ref 15–41)
Albumin: 4.3 g/dL (ref 3.5–5.0)
Alkaline Phosphatase: 89 U/L (ref 38–126)
Anion gap: 12 (ref 5–15)
BUN: 7 mg/dL — ABNORMAL LOW (ref 8–23)
CO2: 24 mmol/L (ref 22–32)
Calcium: 9.8 mg/dL (ref 8.9–10.3)
Chloride: 102 mmol/L (ref 98–111)
Creatinine, Ser: 1.04 mg/dL (ref 0.61–1.24)
GFR, Estimated: >60 mL/min (ref >60)
Glucose, Bld: 87 mg/dL (ref 70–99)
Potassium: 4.3 mmol/L (ref 3.5–5.1)
Sodium: 138 mmol/L (ref 135–145)
Total Bilirubin: 0.9 mg/dL (ref 0.0–1.2)
Total Protein: 7.9 g/dL (ref 6.5–8.1)

## 2023-07-25 LAB — TSH: TSH: 1.933 u[IU]/mL (ref 0.350–4.500)

## 2023-07-25 LAB — ETHANOL: Alcohol, Ethyl (B): 111 mg/dL — ABNORMAL HIGH (ref <15)

## 2023-07-25 MED ORDER — MAGNESIUM HYDROXIDE 400 MG/5ML PO SUSP: 30.0000 mL | Freq: Every day | ORAL | Status: DC | PRN

## 2023-07-25 MED ORDER — OLANZAPINE 10 MG IM SOLR: 5.0000 mg | Freq: Three times a day (TID) | INTRAMUSCULAR | Status: DC | PRN

## 2023-07-25 MED ORDER — OLANZAPINE 5 MG PO TBDP: 5.0000 mg | ORAL_TABLET | Freq: Three times a day (TID) | ORAL | Status: DC | PRN

## 2023-07-25 MED ORDER — ACETAMINOPHEN 325 MG PO TABS: 650.0000 mg | ORAL_TABLET | Freq: Four times a day (QID) | ORAL | Status: DC | PRN

## 2023-07-25 MED ORDER — ALUM & MAG HYDROXIDE-SIMETH 200-200-20 MG/5ML PO SUSP: 30.0000 mL | ORAL | Status: DC | PRN

## 2023-07-25 MED ORDER — OLANZAPINE 10 MG IM SOLR: 10.0000 mg | Freq: Three times a day (TID) | INTRAMUSCULAR | Status: DC | PRN

## 2023-07-25 NOTE — Group Note (Unsigned)
 Date:  07/25/2023 Time:  8:35 PM  Group Topic/Focus:  Wrap-Up Group:   The focus of this group is to help patients review their daily goal of treatment and discuss progress on daily workbooks.     Participation Level:  {BHH PARTICIPATION MWUXL:24401}  Participation Quality:  {BHH PARTICIPATION QUALITY:22265}  Affect:  {BHH AFFECT:22266}  Cognitive:  {BHH COGNITIVE:22267}  Insight: {BHH Insight2:20797}  Engagement in Group:  {BHH ENGAGEMENT IN UUVOZ:36644}  Modes of Intervention:  {BHH MODES OF INTERVENTION:22269}  Additional Comments:  ***  Edwin Henry 07/25/2023, 8:35 PM

## 2023-07-25 NOTE — ED Provider Notes (Signed)
 [facility] Continuous Assessment Admission H&P  Date: 07/25/23 Patient Name: Edwin Henry MRN: [id number] Chief Complaint: out of my medication and hearing voices   Diagnoses:  Final diagnoses:  None    HPI: Edwin Henry, 63 y/o male with a history of schizoaffective disorder, severe benzodiazepine use disorder, suicidal ideation and chronic, hallucination.  Presented to [facility], per the patient he is out of his medication and need a refill.  Patient also endorsed suicidal ideation has no plans.  Patient also stated he hear voices telling him to kill himself.  I review of patient records show similar presentation on multiple occasions.  According to patient he lives alone.  Patient does see an provider at [facility] and according to him they told him that his medicine will be ready tomorrow.  Patient also endorsed insomnia stating that he has not slept in a while.  Is to face observation of patient, patient is alert and oriented x 4, speech is clear, maintain eye contact.  Patient answer questions appropriately however patient does appear to be a little bit angry.  Patient endorsed suicidal ideation but stated he has no plans.  Denies HI, reports he hears voices telling him to kill himself.  Patient reports he drank 2 beers today but stating that he does not drink every day.  Patient denies illicit drug use.  Given patient presentation and past history.  Writer discussed with patient that we will keep him for overnight observation and continue his medicine.  Recommend OBSERVATION units  Total Time spent with patient: 30 minutes  Musculoskeletal  Strength & Muscle Tone: within normal limits Gait & Station: normal Patient leans: N/A  Psychiatric Specialty Exam  Presentation General Appearance:  Casual  Eye Contact: Good  Speech: Clear and Coherent  Speech Volume: Normal  Handedness: Right   Mood and Affect  Mood: Anxious  Affect: Appropriate   Thought Process   Thought Processes: Coherent  Descriptions of Associations:Intact  Orientation:Full (Time, Place and Person)  Thought Content:Logical  Diagnosis of Schizophrenia or Schizoaffective disorder in past: No  Duration of Psychotic Symptoms: No data recorded Hallucinations:Hallucinations: Auditory Description of Auditory Hallucinations: voices telling him to kill himself  Ideas of Reference:None  Suicidal Thoughts:Suicidal Thoughts: Yes, Passive SI Passive Intent and/or Plan: With Intent; Without Plan  Homicidal Thoughts:Homicidal Thoughts: No   Sensorium  Memory: Immediate Fair  Judgment: Fair  Insight: Fair   Executive Functions  Concentration: Good  Attention Span: Good  Recall: Good  Fund of Knowledge: Good  Language: Good   Psychomotor Activity  Psychomotor Activity: Psychomotor Activity: Normal   Assets  Assets: Desire for Improvement   Sleep  Sleep: Sleep: Fair Number of Hours of Sleep: 4   Nutritional Assessment (For OBS and FBC admissions only) Has the patient had a weight loss or gain of 10 pounds or more in the last 3 months?: No Has the patient had a decrease in food intake/or appetite?: No Does the patient have dental problems?: No Does the patient have eating habits or behaviors that may be indicators of an eating disorder including binging or inducing vomiting?: No Has the patient recently lost weight without trying?: 0 Has the patient been eating poorly because of a decreased appetite?: 0 Malnutrition Screening Tool Score: 0    Physical Exam HENT:     Head: Normocephalic.     Nose: Nose normal.   Eyes:     Pupils: Pupils are equal, round, and reactive to light.    Cardiovascular:  Rate and Rhythm: Normal rate.  Pulmonary:     Effort: Pulmonary effort is normal.   Musculoskeletal:        General: Normal range of motion.     Cervical back: Normal range of motion.   Neurological:     General: No focal deficit  present.     Mental Status: He is alert.   Psychiatric:        Mood and Affect: Mood normal.        Thought Content: Thought content normal.    ROS  Blood pressure (!) 126/93, pulse 86, temperature 98.9 F (37.2 C), temperature source Oral, resp. rate 20, SpO2 99%. There is no height or weight on file to calculate BMI.  Past Psychiatric History: Schizoaffective disorder, hallucination, SI, benzodiazepine abuse.  Is the patient at risk to self? Yes  Has the patient been a risk to self in the past 6 months? Yes .    Has the patient been a risk to self within the distant past? Yes   Is the patient a risk to others? No   Has the patient been a risk to others in the past 6 months? No   Has the patient been a risk to others within the distant past? No   Past Medical History: See chart  Family History: Unknown  Social History: Alcohol  Last Labs:  No visits with results within 6 Month(s) from this visit.  Latest known visit with results is:  Admission on 01/19/2023, Discharged on 01/20/2023  Component Date Value Ref Range Status   Sodium 01/19/2023 130 (L)  135 - 145 mmol/L Final   Potassium 01/19/2023 3.8  3.5 - 5.1 mmol/L Final   Chloride 01/19/2023 100  98 - 111 mmol/L Final   CO2 01/19/2023 17 (L)  22 - 32 mmol/L Final   Glucose, Bld 01/19/2023 99  70 - 99 mg/dL Final   Glucose reference range applies only to samples taken after fasting for at least 8 hours.   BUN 01/19/2023 10  8 - 23 mg/dL Final   Creatinine, Ser 01/19/2023 0.73  0.61 - 1.24 mg/dL Final   Calcium 96/05/5407 8.9  8.9 - 10.3 mg/dL Final   Total Protein 81/19/1478 8.1  6.5 - 8.1 g/dL Final   Albumin 29/56/2130 4.2  3.5 - 5.0 g/dL Final   AST 86/57/8469 24  15 - 41 U/L Final   ALT 01/19/2023 26  0 - 44 U/L Final   Alkaline Phosphatase 01/19/2023 93  38 - 126 U/L Final   Total Bilirubin 01/19/2023 0.6  <1.2 mg/dL Final   GFR, Estimated 01/19/2023 >60  >60 mL/min Final   Comment: (NOTE) Calculated using the  CKD-EPI Creatinine Equation (2021)    Anion gap 01/19/2023 13  5 - 15 Final   Performed at [facility], [address], [place], [place] [address]   Alcohol, Ethyl (B) 01/19/2023 136 (H)  <10 mg/dL Final   Comment: (NOTE) Lowest detectable limit for serum alcohol is 10 mg/dL.  For medical purposes only. Performed at [facility], [address], [place], [place] [address]    Salicylate Lvl 01/19/2023 <7.0 (L)  7.0 - 30.0 mg/dL Final   Performed at [facility], [address], [place], [place] [address]   Acetaminophen  (Tylenol ), Serum 01/19/2023 <10 (L)  10 - 30 ug/mL Final   Comment: (NOTE) Therapeutic concentrations vary significantly. A range of 10-30 ug/mL  may be an effective concentration for many patients.  However, some  are best treated at concentrations outside of this range. Acetaminophen  concentrations >150 ug/mL at 4 hours after ingestion  and >50 ug/mL at 12 hours after ingestion are often associated with  toxic reactions.  Performed at [facility], [address] [place], [place] [address]    Opiates 01/19/2023 NONE DETECTED  NONE DETECTED Final   Cocaine 01/19/2023 NONE DETECTED  NONE DETECTED Final   Benzodiazepines 01/19/2023 NONE DETECTED  NONE DETECTED Final   Amphetamines 01/19/2023 NONE DETECTED  NONE DETECTED Final   Tetrahydrocannabinol 01/19/2023 NONE DETECTED  NONE DETECTED Final   Barbiturates 01/19/2023 NONE DETECTED  NONE DETECTED Final   Comment: (NOTE) DRUG SCREEN FOR MEDICAL PURPOSES ONLY.  IF CONFIRMATION IS NEEDED FOR ANY PURPOSE, NOTIFY LAB WITHIN 5 DAYS.  LOWEST DETECTABLE LIMITS FOR URINE DRUG SCREEN Drug Class                     Cutoff (ng/mL) Amphetamine and metabolites    1000 Barbiturate and metabolites    200 Benzodiazepine                 200 Opiates and metabolites        300 Cocaine and metabolites        300 THC                             50 Performed at [facility], [address], [place], [place] [address]    WBC 01/19/2023 8.0  4.0 - 10.5 K/uL Final   RBC 01/19/2023 5.38  4.22 - 5.81 MIL/uL Final   Hemoglobin 01/19/2023 17.3 (H)  13.0 - 17.0 g/dL Final   HCT 09/81/1914 49.1  39.0 - 52.0 % Final   MCV 01/19/2023 91.3  80.0 - 100.0 fL Final   MCH 01/19/2023 32.2  26.0 - 34.0 pg Final   MCHC 01/19/2023 35.2  30.0 - 36.0 g/dL Final   RDW 78/29/5621 14.6  11.5 - 15.5 % Final   Platelets 01/19/2023 263  150 - 400 K/uL Final   nRBC 01/19/2023 0.0  0.0 - 0.2 % Final   Performed at [facility], [address], [place], [place] [address]    Allergies: Patient has no known allergies.  Medications:  PTA Medications  Medication Sig   mirtazapine  (REMERON ) 30 MG tablet Take 30 mg by mouth at bedtime.   benztropine  (COGENTIN ) 1 MG tablet Take 1 mg by mouth at bedtime.   gabapentin  (NEURONTIN ) 100 MG capsule Take 100 mg by mouth 2 (two) times daily.   OLANZapine  (ZYPREXA ) 7.5 MG tablet Take 1 tablet (7.5 mg total) by mouth at bedtime.   QUEtiapine  (SEROQUEL ) 400 MG tablet Take 2 tablets (800 mg total) by mouth at bedtime. (Patient taking differently: Take 400 mg by mouth at bedtime.)   feeding supplement (ENSURE ENLIVE / ENSURE PLUS) LIQD Take 237 mLs by mouth 3 (three) times daily between meals.   Melatonin 10 MG CAPS Take 10 mg by mouth at bedtime.   OLANZapine  (ZYPREXA ) 5 MG tablet Take 5 mg by mouth at bedtime.      Medical Decision Making  Observation unit    Recommendations  Based on my evaluation the patient does not appear to have an emergency medical condition.  Dorthea Gauze, NP 07/25/23  7:43 PM

## 2023-07-25 NOTE — ED Notes (Signed)
 Patient alert and oriented X 4, reported having auditory hallucination at home hearing voices telling him to harm himself. Patient's skin assessment completed, oriented to the unit and now in bed watching tv.
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
## 2023-07-25 NOTE — Progress Notes (Signed)
   07/25/23 1808  [facility] Triage Screening (Walk-ins at [facility] only)  How Did You Hear About Us ? Legal System  What Is the Reason for Your Visit/Call Today? Edwin Henry presents to [facility] escorted by [facility]. Pt reports that he has been off his sleeping meds for 3 days and is looking for a refill. Pt states that his voices are getting worse because he has been off his medication. Pt is currently taking saraquil at this time. Pt also mentions that he is endorsing AH at this time. PT states the voices tell him to kill himself. Pt also endorses SI at this time, but has no plan. Pt also mentions he had 2 beers today, but reports that he does not drink daily. Pt denies drug use, Hi and VH.  How Long Has This Been Causing You Problems? <Week  Have You Recently Had Any Thoughts About Hurting Yourself? Yes  How long ago did you have thoughts about hurting yourself? today  Are You Planning to Commit Suicide/Harm Yourself At This time? No  Have you Recently Had Thoughts About Hurting Someone [name]? No  Are You Planning To Harm Someone At This Time? No  Physical Abuse Denies  Verbal Abuse Denies  Sexual Abuse Denies  Exploitation of patient/patient's resources Denies  Self-Neglect Denies  Possible abuse reported to: Other (Comment)  Are you currently experiencing any auditory, visual or other hallucinations? Yes  Please explain the hallucinations you are currently experiencing: voices are telling him to kill himself  Have You Used Any Alcohol or Drugs in the Past 24 Hours? Yes  What Did You Use and How Much? 2 beers  Do you have any current medical co-morbidities that require immediate attention? No  Clinician description of patient physical appearance/behavior: calm, polite, cooperative  What Do You Feel Would Help You the Most Today? Medication(s)  If access to [facility] was not available, would you have sought care in the Emergency Department? No  Determination of Need Urgent (48 hours)  Options For Referral  Inpatient Hospitalization;Intensive Outpatient Therapy;Medication Management  Determination of Need filed? Yes

## 2023-07-26 LAB — POCT URINE DRUG SCREEN - MANUAL ENTRY (I-SCREEN)
POC Amphetamine UR: NOT DETECTED
POC Buprenorphine (BUP): NOT DETECTED
POC Cocaine UR: NOT DETECTED
POC Marijuana UR: NOT DETECTED
POC Methadone UR: NOT DETECTED
POC Methamphetamine UR: NOT DETECTED
POC Morphine: NOT DETECTED
POC Oxazepam (BZO): NOT DETECTED
POC Oxycodone UR: NOT DETECTED
POC Secobarbital (BAR): NOT DETECTED

## 2023-07-26 MED ORDER — MIRTAZAPINE 30 MG PO TABS
30.0000 mg | ORAL_TABLET | Freq: Every day | ORAL | Status: DC
  Administered 2023-07-26: 30 mg via ORAL
  Filled 2023-07-26: qty 1
  Filled 2023-07-26: qty 7

## 2023-07-26 MED ORDER — OLANZAPINE 7.5 MG PO TABS
7.5000 mg | ORAL_TABLET | Freq: Every day | ORAL | Status: DC
  Administered 2023-07-26: 7.5 mg via ORAL
  Filled 2023-07-26: qty 1
  Filled 2023-07-26: qty 7

## 2023-07-26 MED ORDER — CHLORPROMAZINE HCL 50 MG PO TABS
50.0000 mg | ORAL_TABLET | Freq: Every day | ORAL | Status: DC
Start: 2023-07-26 — End: 2023-07-27
  Administered 2023-07-26: 50 mg via ORAL
  Filled 2023-07-26: qty 7
  Filled 2023-07-26: qty 1

## 2023-07-26 MED ORDER — GABAPENTIN 100 MG PO CAPS
100.0000 mg | ORAL_CAPSULE | Freq: Two times a day (BID) | ORAL | Status: DC
  Administered 2023-07-26 – 2023-07-27 (×3): 100 mg via ORAL
  Filled 2023-07-26: qty 1
  Filled 2023-07-26: qty 14
  Filled 2023-07-26 (×2): qty 1

## 2023-07-26 MED ORDER — MELATONIN 5 MG PO TABS
5.0000 mg | ORAL_TABLET | Freq: Every day | ORAL | Status: DC
  Administered 2023-07-26: 5 mg via ORAL
  Filled 2023-07-26: qty 7
  Filled 2023-07-26: qty 1

## 2023-07-26 MED ORDER — QUETIAPINE FUMARATE 200 MG PO TABS
200.0000 mg | ORAL_TABLET | Freq: Every day | ORAL | Status: DC
  Administered 2023-07-26: 200 mg via ORAL
  Filled 2023-07-26: qty 7
  Filled 2023-07-26: qty 1

## 2023-07-26 MED ORDER — BENZTROPINE MESYLATE 1 MG PO TABS
1.0000 mg | ORAL_TABLET | Freq: Every day | ORAL | Status: DC
  Administered 2023-07-26: 1 mg via ORAL
  Filled 2023-07-26: qty 7
  Filled 2023-07-26: qty 1

## 2023-07-26 NOTE — ED Notes (Signed)
 Pt denies SI/HI/AVH. Pt appears calm and is cooperative. Pt voiced no complaints to this Clinical research associate. He is meication compliant and is safe on the unit at this time with facility protocol safety checks in place.

## 2023-07-26 NOTE — ED Notes (Signed)
 0800: pt awake and AOX4. Denies pain or discomfort att. Reports Audio Hallucinations telling him to kill myself.' Reports he does not have SI/HI but the voices he hears are telling him to. Denies Visual Hallucinations att. Asking about medications but could not state specific medications taken. Clinician made aware. Safety measures continued.

## 2023-07-26 NOTE — ED Provider Notes (Signed)
 Behavioral Health Progress Note  Date and Time: 07/26/2023 8:48 AM Name: Edwin Henry MRN:  [id number]  Subjective:   Edwin Henry is a 63 year-old male who presented to [facility] last night complaining of increased hallucinations and suicidal ideations. Presented with a hx of Schizoaffective Disorder, Benzodiazapine use, Alcohol use, and chronic hallucinations.  He reported that he has been off medications for about 3 days and looking for a refill. He reported that the voices were getting worse and worse, accompanied with suicidal ideations. He also reported that he had had 2 beers but does not drink on a daily basis. He denied use of other substances.   Patient was evaluated by attending provider and, per chart review, he reported that the voices were commending him to kill himself, but he had no plan to do so.  He reported that he has outpatient services at [facility] and is waiting for medication delivery.   Assessments: Follow-up Face-to-face evaluation today: pt in bed awake. He is receptive upon approach. Alert and oriented x 4. He appears disheveled with diminished hygiene.  Eye contact is fair. Thought process is coherent/goal directed. Denies HI. Patient continues to endorse hallucinations and passive SI.  Admits to using alcohol prior to this admission. BAL 111 on 07/25/2023. Patient reports that he drinks 2-3 cans  of beer  a day but does not drink every day. Patient reports he was doing well until he ran out of his medications. Unable to specify which medications he takes.  He reports poor appetite stating when I get like this, I don't eat. Reports he has not been getting adequate sleep for the past couple of days. Patient reports that he drinks alcohol because it helps me control myself.  Reports he is not interested in alcohol treatment. Reports he is waiting for his ACT team nurse to deliver his medications.   Provider contacted patient's ACCT team [facility]) to verify current medication  regimen: was informed that patient's nurse was going to call back with medication list. Nurse has not called yet. We will continue to reach out to [facility] for medication reconciliation.   Diagnosis:  Final diagnoses:  Auditory hallucination  Medication refill    Total Time spent with patient: 30 minutes  Past Psychiatric History: Schizophrenia, Substance use Past Medical History: NA Family History: NA Family Psychiatric  History: NA Social History: Lives alone. Has some family member who check on him  Additional Social History:    Pain Medications: See MARs Prescriptions: See MARs Over the Counter: See MARs History of alcohol / drug use?: Yes Longest period of sobriety (when/how long): Patient reports he only drinks a beer on occasion. Negative Consequences of Use: Personal relationships, Work / Programmer, multimedia, Surveyor, quantity Withdrawal Symptoms: None Name of Substance 1: alcohol 1 - Age of First Use: unknown 1 - Amount (size/oz): 2 beers 1 - Frequency: weekly 1 - Duration: ongoing 1 - Last Use / Amount: today 1 - Method of Aquiring: unknown 1- Route of Use: drink, oral                  Sleep: Poor  Appetite:  Poor  Current Medications:  Current Facility-Administered Medications  Medication Dose Route Frequency Provider Last Rate Last Admin   acetaminophen  (TYLENOL ) tablet 650 mg  650 mg Oral Q6H PRN Dorthea Gauze, NP       alum & mag hydroxide-simeth (MAALOX/MYLANTA) 200-200-20 MG/5ML suspension 30 mL  30 mL Oral Q4H PRN Dorthea Gauze, NP  gabapentin  (NEURONTIN ) capsule 100 mg  100 mg Oral BID Lorrene Rosser, Micheline Markes M, NP       magnesium  hydroxide (MILK OF MAGNESIA) suspension 30 mL  30 mL Oral Daily PRN Dorthea Gauze, NP       OLANZapine  (ZYPREXA ) injection 10 mg  10 mg Intramuscular TID PRN Dorthea Gauze, NP       OLANZapine  (ZYPREXA ) injection 5 mg  5 mg Intramuscular TID PRN Dorthea Gauze, NP       OLANZapine  zydis (ZYPREXA ) disintegrating tablet 5 mg  5 mg Oral TID  PRN Dorthea Gauze, NP       Current Outpatient Medications  Medication Sig Dispense Refill   benztropine  (COGENTIN ) 1 MG tablet Take 1 mg by mouth at bedtime.     feeding supplement (ENSURE ENLIVE / ENSURE PLUS) LIQD Take 237 mLs by mouth 3 (three) times daily between meals. 237 mL 12   gabapentin  (NEURONTIN ) 100 MG capsule Take 100 mg by mouth 2 (two) times daily.     Melatonin 10 MG CAPS Take 10 mg by mouth at bedtime.     mirtazapine  (REMERON ) 30 MG tablet Take 30 mg by mouth at bedtime.     OLANZapine  (ZYPREXA ) 5 MG tablet Take 5 mg by mouth at bedtime.     OLANZapine  (ZYPREXA ) 7.5 MG tablet Take 1 tablet (7.5 mg total) by mouth at bedtime. 30 tablet 0   QUEtiapine  (SEROQUEL ) 400 MG tablet Take 2 tablets (800 mg total) by mouth at bedtime. (Patient taking differently: Take 400 mg by mouth at bedtime.) 60 tablet 0    Labs  Lab Results:  Admission on 07/25/2023  Component Date Value Ref Range Status   WBC 07/25/2023 7.9  4.0 - 10.5 K/uL Final   RBC 07/25/2023 5.32  4.22 - 5.81 MIL/uL Final   Hemoglobin 07/25/2023 16.2  13.0 - 17.0 g/dL Final   HCT 40/98/1191 48.2  39.0 - 52.0 % Final   MCV 07/25/2023 90.6  80.0 - 100.0 fL Final   MCH 07/25/2023 30.5  26.0 - 34.0 pg Final   MCHC 07/25/2023 33.6  30.0 - 36.0 g/dL Final   RDW 47/82/9562 13.8  11.5 - 15.5 % Final   Platelets 07/25/2023 330  150 - 400 K/uL Final   nRBC 07/25/2023 0.0  0.0 - 0.2 % Final   Neutrophils Relative % 07/25/2023 54  % Final   Neutro Abs 07/25/2023 4.3  1.7 - 7.7 K/uL Final   Lymphocytes Relative 07/25/2023 33  % Final   Lymphs Abs 07/25/2023 2.6  0.7 - 4.0 K/uL Final   Monocytes Relative 07/25/2023 10  % Final   Monocytes Absolute 07/25/2023 0.8  0.1 - 1.0 K/uL Final   Eosinophils Relative 07/25/2023 2  % Final   Eosinophils Absolute 07/25/2023 0.1  0.0 - 0.5 K/uL Final   Basophils Relative 07/25/2023 1  % Final   Basophils Absolute 07/25/2023 0.1  0.0 - 0.1 K/uL Final   Immature Granulocytes 07/25/2023 0   % Final   Abs Immature Granulocytes 07/25/2023 0.03  0.00 - 0.07 K/uL Final   Performed at [facility], [address] [place], [place] [address]   Sodium 07/25/2023 138  135 - 145 mmol/L Final   Potassium 07/25/2023 4.3  3.5 - 5.1 mmol/L Final   Chloride 07/25/2023 102  98 - 111 mmol/L Final   CO2 07/25/2023 24  22 - 32 mmol/L Final   Glucose, Bld 07/25/2023 87  70 - 99 mg/dL Final   Glucose  reference range applies only to samples taken after fasting for at least 8 hours.   BUN 07/25/2023 7 (L)  8 - 23 mg/dL Final   Creatinine, Ser 07/25/2023 1.04  0.61 - 1.24 mg/dL Final   Calcium 16/11/9602 9.8  8.9 - 10.3 mg/dL Final   Total Protein 54/10/8117 7.9  6.5 - 8.1 g/dL Final   Albumin 14/78/2956 4.3  3.5 - 5.0 g/dL Final   AST 21/30/8657 19  15 - 41 U/L Final   ALT 07/25/2023 21  0 - 44 U/L Final   Alkaline Phosphatase 07/25/2023 89  38 - 126 U/L Final   Total Bilirubin 07/25/2023 0.9  0.0 - 1.2 mg/dL Final   GFR, Estimated 07/25/2023 >60  >60 mL/min Final   Comment: (NOTE) Calculated using the CKD-EPI Creatinine Equation (2021)    Anion gap 07/25/2023 12  5 - 15 Final   Performed at [facility], [address], [place], [place] [address]   Alcohol, Ethyl (B) 07/25/2023 111 (H)  <15 mg/dL Final   Comment: (NOTE) For medical purposes only. Performed at [facility], [address], [place], [place] [address]    TSH 07/25/2023 1.933  0.350 - 4.500 uIU/mL Final   Comment: Performed by a 3rd Generation assay with a functional sensitivity of <=0.01 uIU/mL. Performed at [facility], [address] [place], [place] [address]    POC Amphetamine UR 07/26/2023 None Detected  NONE DETECTED (Cut Off Level 1000 ng/mL) Final   POC Secobarbital (BAR) 07/26/2023 None Detected  NONE DETECTED (Cut Off Level 300 ng/mL) Final   POC Buprenorphine (BUP) 07/26/2023 None Detected  NONE DETECTED (Cut Off Level 10 ng/mL) Final   POC Oxazepam (BZO) 07/26/2023 None Detected  NONE  DETECTED (Cut Off Level 300 ng/mL) Final   POC Cocaine UR 07/26/2023 None Detected  NONE DETECTED (Cut Off Level 300 ng/mL) Final   POC Methamphetamine UR 07/26/2023 None Detected  NONE DETECTED (Cut Off Level 1000 ng/mL) Final   POC Morphine 07/26/2023 None Detected  NONE DETECTED (Cut Off Level 300 ng/mL) Final   POC Methadone UR 07/26/2023 None Detected  NONE DETECTED (Cut Off Level 300 ng/mL) Final   POC Oxycodone  UR 07/26/2023 None Detected  NONE DETECTED (Cut Off Level 100 ng/mL) Final   POC Marijuana UR 07/26/2023 None Detected  NONE DETECTED (Cut Off Level 50 ng/mL) Final    Blood Alcohol level:  Lab Results  Component Value Date   ETH 111 (H) 07/25/2023   ETH 136 (H) 01/19/2023    Metabolic Disorder Labs: Lab Results  Component Value Date   HGBA1C 5.8 (H) 04/18/2022   MPG 120 04/18/2022   MPG 120 08/01/2021   Lab Results  Component Value Date   PROLACTIN 1.0 (L) 04/18/2022   PROLACTIN 0.7 (L) 08/01/2021   Lab Results  Component Value Date   CHOL 217 (H) 04/18/2022   TRIG 221 (H) 04/18/2022   HDL 42 04/18/2022   CHOLHDL 5.2 04/18/2022   VLDL 44 (H) 04/18/2022   LDLCALC 131 (H) 04/18/2022   LDLCALC UNABLE TO CALCULATE IF TRIGLYCERIDE OVER 400 mg/dL 40/11/2723    Therapeutic Lab Levels: No results found for: LITHIUM No results found for: VALPROATE No results found for: CBMZ  Physical Findings   AIMS    Flowsheet Row [facility]) [facility] Ad[facility] (Discharged) [facility] Admission (Discharged) from OP [facility] Ad[facility] (Discharged) from [facility] Ad[facility] (Discharged) [facility]  AIMS Total Score 0 0 0 0 0   AUDIT    Flowsheet Row [facility]) [facility] Admission (Discharged) from [facility] Admission (Discharged) from OP Visit from [facility]12[facility] Admission (Discharged) from 05/22/2018 [facility] Admission (Discharged) from [facility]10[facility]  Alcohol Use Disorder Identification Test Final Score (AUDIT) 4 4 4 2 4    Flowsheet Row ED from 07/25/2023 in [facility] ED from 01/19/2023 in [facility] Emergency Department at [facility] ED from 09/18/2022 in [facility] Emergency Department at [facility]  C-SSRS RISK CATEGORY High Risk Error: Question 6 not populated High Risk     Musculoskeletal  Strength & Muscle Tone: within normal limits Gait & Station: normal Patient leans: N/A  Psychiatric Specialty Exam  Presentation  General Appearance:  Disheveled  Eye Contact: Fair  Speech: Clear and Coherent  Speech Volume: Normal  Handedness: Right   Mood and Affect  Mood: Anxious  Affect: Depressed   Thought Process  Thought Processes: Coherent  Descriptions of Associations:Intact  Orientation:Full (Time, Place and Person)  Thought Content:Logical  Diagnosis of Schizophrenia or Schizoaffective disorder in past: Yes  Duration of Psychotic Symptoms: No data recorded  Hallucinations:Hallucinations: Auditory Description of Auditory Hallucinations: voices telling him to kill himself  Ideas of Reference:None  Suicidal Thoughts:Suicidal Thoughts: Yes, Passive SI Passive Intent and/or Plan: Without Intent  Homicidal Thoughts:Homicidal Thoughts: No   Sensorium  Memory: Immediate Fair; Recent Fair; Remote Fair  Judgment: Fair  Insight: Fair   Chartered certified accountant: Fair  Attention Span: Fair  Recall: [employer] of  Knowledge: Fair  Language: Fair   Psychomotor Activity  Psychomotor Activity: Psychomotor Activity: Normal   Assets  Assets: Communication Skills; Desire for Improvement   Sleep  Sleep: Sleep: Poor  No Safety Checks orders active in given range  Nutritional Assessment (For OBS and [facility]BC admissions only) Has the patient had a weight loss or gain of 10 pounds or more in the last 3 months?: No Has the patient had a decrease in food intake/or appetite?: Yes Does the patient have dental problems?: No Does the patient have eating habits or behaviors that may be indicators of an eating disorder including binging or inducing vomiting?: No Has the patient recently lost weight without trying?: 0 Has the patient been eating poorly because of a decreased appetite?: 1 (I don't eat when I get like this) Malnutrition Screening Tool Score: 1    Physical Exam  Physical Exam Vitals and nursing note reviewed.  HENT:     Head: Normocephalic and atraumatic.     Right Ear: Tympanic membrane normal.     Left Ear: Tympanic membrane normal.     Nose: Nose normal.   Eyes:     Extraocular Movements: Extraocular movements intact.     Pupils: Pupils are equal, round, and reactive to light.   Pulmonary:     Effort: Pulmonary effort is normal.   Musculoskeletal:        General: Normal range of motion.     Cervical back: Normal range of motion.   Neurological:     General: No focal deficit present.  Mental Status: He is alert and oriented to person, place, and time.    Review of Systems  Constitutional: Negative.   HENT: Negative.    Eyes: Negative.   Respiratory: Negative.    Cardiovascular: Negative.   Gastrointestinal: Negative.   Genitourinary: Negative.   Musculoskeletal: Negative.   Skin: Negative.   Neurological: Negative.   Endo/Heme/Allergies: Negative.   Psychiatric/Behavioral:  Positive for hallucinations and substance abuse. The patient is nervous/anxious and has  insomnia.    Blood pressure (!) 126/93, pulse 86, temperature 98.9 F (37.2 C), temperature source Oral, resp. rate 20, SpO2 99%. There is no height or weight on file to calculate BMI.  Treatment Plan Summary: Daily contact with patient to assess and evaluate symptoms and progress in treatment and Medication management  Elston Halsted, NP 07/26/2023 8:48 AM

## 2023-07-26 NOTE — ED Notes (Signed)
 1750: pt is watching tv, calm and cooperative. Denies any pain or discomfort att. Denies SI/HI and still hears mild voices telling him to hurt himself but denies seeing things.

## 2023-07-27 MED ORDER — OLANZAPINE 5 MG PO TABS
5.0000 mg | ORAL_TABLET | Freq: Every day | ORAL | Status: DC
  Filled 2023-07-27: qty 7

## 2023-07-27 MED ORDER — QUETIAPINE FUMARATE 400 MG PO TABS
800.0000 mg | ORAL_TABLET | Freq: Every day | ORAL | Status: DC
  Filled 2023-07-27: qty 14

## 2023-07-27 NOTE — ED Notes (Signed)
 Patient discharged in no acute distress. AVS given and reviewed with pt voicing understanding. Patient escorted to the locker room where his belongings were returned from locker # 12. Bus pass provided and pt escorted to the front lobby.

## 2023-07-27 NOTE — ED Provider Notes (Signed)
 FBC/OBS ASAP Discharge Summary  Date and Time: 07/27/2023 9:25 AM  Name: Edwin Henry  MRN:  [id number]   Discharge Diagnoses:  Final diagnoses:  Auditory hallucination  Medication refill    Subjective: Edwin Henry is a 63 year-old male who presented to [facility] last night complaining of increased hallucinations and suicidal ideations. Presented with a hx of Schizoaffective Disorder, Benzodiazapine use, Alcohol use, and chronic hallucinations. He reported that he has been off medications for about 3 days and looking for a refill.   Patient was seen this AM, no acute distress. He reports feeling better and plans on returning home as he confirmed with his nurse that his psychotropic medications have been delivered to his home. He denies SI, HI, and command auditory hallucinations. I does report AH and describes it as chatter. They are not telling me to kill myself. We discussed a safety plan including warning signs, coping strategies, and possible people to contact and resources to utilize if AVH worsen.   Collateral Call I spoke with patient's ACCT team nurse Taz [phone]). Confirmed with her that patient is safe to discharge home and his medications were delivered to his home yesterday. She states that patient takes double the amount of medications at times in which this causes him to run out of medications. I discussed how we can provide a week supply of his psychotropic medication to aid.    Stay Summary: The patient was evaluated each day by a clinical provider to ascertain response to treatment. Improvement was noted by the patient's report of decreasing symptoms, improved sleep and appetite, affect, medication tolerance, behavior, and participation in unit programming.  Patient was asked each day to complete a self inventory noting mood, mental status, pain, new symptoms, anxiety and concerns.  The patient's medications were managed with the following directions: -Home Cogentin ,  Thorazine, gabapentin , melatonin, Remeron , Zyprexa , and Seroquel  continued by admitting provider When completing med reconciliation, Thorazine has not been an active medication. I ordered a 7 day supply of Remeron , Neurontin , Cogentin , Zyprexa , and Seroquel   Patient responded well to medication and being in a therapeutic and supportive environment. Positive and appropriate behavior was noted and the patient was motivated for recovery. The patient worked closely with the treatment team and case manager to develop a discharge plan with appropriate goals. Coping skills, problem solving as well as relaxation therapies were also part of the unit programming.    Total Time spent with patient: 30 minutes  Past Psychiatric History: schizophrenia, alcohol use; current psychotropic medications as above; has an ACCT team; multiple IP psychiatric hospitalizations in the past Past Medical History: HTN Family Psychiatric History: Denies Social History: lives alone Tobacco Cessation:  N/A, patient does not currently use tobacco products  Current Medications:  Current Facility-Administered Medications  Medication Dose Route Frequency Provider Last Rate Last Admin   acetaminophen  (TYLENOL ) tablet 650 mg  650 mg Oral Q6H PRN Dorthea Gauze, NP       alum & mag hydroxide-simeth (MAALOX/MYLANTA) 200-200-20 MG/5ML suspension 30 mL  30 mL Oral Q4H PRN Dorthea Gauze, NP       benztropine  (COGENTIN ) tablet 1 mg  1 mg Oral QHS Lorrene Rosser, Veronique M, NP   1 mg at 07/26/23 2134   chlorproMAZINE (THORAZINE) tablet 50 mg  50 mg Oral QHS Byungura, Veronique M, NP   50 mg at 07/26/23 2134   gabapentin  (NEURONTIN ) capsule 100 mg  100 mg Oral BID Gilman Lade, NP   100 mg at 07/27/23 [phone]  magnesium  hydroxide (MILK OF MAGNESIA) suspension 30 mL  30 mL Oral Daily PRN Dorthea Gauze, NP       melatonin tablet 5 mg  5 mg Oral QHS Byungura, Veronique M, NP   5 mg at 07/26/23 2134   mirtazapine  (REMERON ) tablet 30 mg  30  mg Oral QHS Gilman Lade, NP   30 mg at 07/26/23 2134   OLANZapine  (ZYPREXA ) injection 10 mg  10 mg Intramuscular TID PRN Dorthea Gauze, NP       OLANZapine  (ZYPREXA ) injection 5 mg  5 mg Intramuscular TID PRN Dorthea Gauze, NP       OLANZapine  (ZYPREXA ) tablet 7.5 mg  7.5 mg Oral QHS Byungura, Veronique M, NP   7.5 mg at 07/26/23 2134   OLANZapine  zydis (ZYPREXA ) disintegrating tablet 5 mg  5 mg Oral TID PRN Dorthea Gauze, NP       QUEtiapine  (SEROQUEL ) tablet 200 mg  200 mg Oral QHS Byungura, Veronique M, NP   200 mg at 07/26/23 2134   Current Outpatient Medications  Medication Sig Dispense Refill   benztropine  (COGENTIN ) 1 MG tablet Take 1 mg by mouth at bedtime.     feeding supplement (ENSURE ENLIVE / ENSURE PLUS) LIQD Take 237 mLs by mouth 3 (three) times daily between meals. 237 mL 12   gabapentin  (NEURONTIN ) 100 MG capsule Take 100 mg by mouth 2 (two) times daily.     Melatonin 10 MG CAPS Take 10 mg by mouth at bedtime.     mirtazapine  (REMERON ) 30 MG tablet Take 30 mg by mouth at bedtime.     OLANZapine  (ZYPREXA ) 5 MG tablet Take 5 mg by mouth at bedtime.     QUEtiapine  (SEROQUEL ) 400 MG tablet Take 2 tablets (800 mg total) by mouth at bedtime. (Patient taking differently: Take 400 mg by mouth at bedtime.) 60 tablet 0    PTA Medications:  Facility Ordered Medications  Medication   acetaminophen  (TYLENOL ) tablet 650 mg   alum & mag hydroxide-simeth (MAALOX/MYLANTA) 200-200-20 MG/5ML suspension 30 mL   magnesium  hydroxide (MILK OF MAGNESIA) suspension 30 mL   OLANZapine  zydis (ZYPREXA ) disintegrating tablet 5 mg   OLANZapine  (ZYPREXA ) injection 5 mg   OLANZapine  (ZYPREXA ) injection 10 mg   gabapentin  (NEURONTIN ) capsule 100 mg   OLANZapine  (ZYPREXA ) tablet 7.5 mg   chlorproMAZINE (THORAZINE) tablet 50 mg   mirtazapine  (REMERON ) tablet 30 mg   benztropine  (COGENTIN ) tablet 1 mg   QUEtiapine  (SEROQUEL ) tablet 200 mg   melatonin tablet 5 mg   PTA Medications   Medication Sig   mirtazapine  (REMERON ) 30 MG tablet Take 30 mg by mouth at bedtime.   benztropine  (COGENTIN ) 1 MG tablet Take 1 mg by mouth at bedtime.   gabapentin  (NEURONTIN ) 100 MG capsule Take 100 mg by mouth 2 (two) times daily.   QUEtiapine  (SEROQUEL ) 400 MG tablet Take 2 tablets (800 mg total) by mouth at bedtime. (Patient taking differently: Take 400 mg by mouth at bedtime.)   feeding supplement (ENSURE ENLIVE / ENSURE PLUS) LIQD Take 237 mLs by mouth 3 (three) times daily between meals.   Melatonin 10 MG CAPS Take 10 mg by mouth at bedtime.   OLANZapine  (ZYPREXA ) 5 MG tablet Take 5 mg by mouth at bedtime.       07/26/2023    4:33 PM  Depression screen PHQ 2/9  Decreased Interest 2  Down, Depressed, Hopeless 2  PHQ - 2 Score 4  Altered sleeping 2  Tired, decreased energy 2  Change in appetite 2  Feeling bad or failure about yourself  2  Trouble concentrating 2  Moving slowly or fidgety/restless 2  Suicidal thoughts 2  PHQ-9 Score 18  Difficult doing work/chores Very difficult    Flowsheet Row ED from 07/25/2023 in [facility] ED from 01/19/2023 in [facility] Emergency Department at [facility] ED from 09/18/2022 in [facility] Emergency Department at [facility]  C-SSRS RISK CATEGORY Low Risk Error: Question 6 not populated High Risk    Musculoskeletal  Strength & Muscle Tone: within normal limits Gait & Station: normal Patient leans: N/A  Psychiatric Specialty Exam  Presentation  General Appearance:  Appropriate for Environment; Disheveled; Casual  Eye Contact: Fair  Speech: Clear and Coherent; Normal Rate  Speech Volume: Normal  Handedness: Right   Mood and Affect  Mood: Euthymic  Affect: Congruent   Thought Process  Thought Processes: Goal Directed; Coherent  Descriptions of Associations:Intact  Orientation:Full (Time, Place and Person)  Thought Content:Logical  Diagnosis of Schizophrenia  or Schizoaffective disorder in past: Yes  Duration of Psychotic Symptoms: >6 mon  Hallucinations:Hallucinations: Auditory Description of Auditory Hallucinations: chit chat  Ideas of Reference:None  Suicidal Thoughts:Suicidal Thoughts: No  Homicidal Thoughts:Homicidal Thoughts: No   Sensorium  Memory: Remote Good  Judgment: Fair  Insight: Fair   Executive Functions  Concentration: Good  Attention Span: Good  Recall: Fair  Fund of Knowledge: Fair  Language: Good   Psychomotor Activity  Psychomotor Activity: Psychomotor Activity: Normal   Assets  Assets: Communication Skills; Desire for Improvement; Social Support   Sleep  Sleep: Sleep: Fair  No Safety Checks orders active in given range  Nutritional Assessment (For OBS and [facility] admissions only) Has the patient had a weight loss or gain of 10 pounds or more in the last 3 months?: No Has the patient had a decrease in food intake/or appetite?: Yes Does the patient have dental problems?: No Does the patient have eating habits or behaviors that may be indicators of an eating disorder including binging or inducing vomiting?: No Has the patient recently lost weight without trying?: 0 Has the patient been eating poorly because of a decreased appetite?: 1 (I don't eat when I get like this) Malnutrition Screening Tool Score: 1    Physical Exam  Physical Exam Vitals reviewed.  Constitutional:      Appearance: Normal appearance.  HENT:     Head: Normocephalic and atraumatic.   Cardiovascular:     Rate and Rhythm: Normal rate.  Pulmonary:     Effort: Pulmonary effort is normal.   Neurological:     General: No focal deficit present.     Mental Status: He is alert and oriented to person, place, and time.    Review of Systems  Constitutional:  Negative for chills and fever.  Respiratory:  Negative for shortness of breath.   Cardiovascular:  Negative for chest pain and palpitations.   Gastrointestinal:  Negative for nausea and vomiting.  Neurological:  Negative for headaches.   Blood pressure 138/86, pulse 94, temperature 98.3 F (36.8 C), temperature source Oral, resp. rate 18, SpO2 100%. There is no height or weight on file to calculate BMI.  Demographic Factors:  Male, Low socioeconomic status, and Living alone  Loss Factors: Financial problems/change in socioeconomic status  Historical Factors: Impulsivity  Risk Reduction Factors:   Positive social support and Positive coping skills or problem solving skills  Continued Clinical Symptoms:  Schizophrenia:   Depressive state  Cognitive Features That Contribute To Risk:  Closed-mindedness    Suicide Risk:  Mild:  Suicidal ideation of limited frequency, intensity, duration, and specificity.  There are no identifiable plans, no associated intent, mild dysphoria and related symptoms, good self-control (both objective and subjective assessment), few other risk factors, and identifiable protective factors, including available and accessible social support.  Plan Of Care/Follow-up recommendations:  Activity as tolerated Regular diet Continue prescription medications- support with ACCT team Patient provided with 7 day supply of psychotropic medications  Disposition: Home with ACCT team   Joice Nares, MD 07/27/2023, 9:25 AM

## 2023-07-27 NOTE — ED Notes (Signed)
 Patient observed sitting up on the side of his lounger. He is A & O X 4. He denies SI/HI or AVH. Patient denies any symptoms of withdrawal. Pt states he is ready to get out of here. States he is fine and only needs to get back on his medications. He is asking to leave as soon as possible. We will make the provider aware of pt's request.

## 2023-07-27 NOTE — ED Notes (Signed)
 Patient resting quietly in bed with eyes closed. Unlabored breathing. NAD noted. Facility protocol safety checks in place. Pt is currently safe on the unit.

## 2023-07-27 NOTE — Discharge Instructions (Signed)

## 2023-07-27 NOTE — ED Notes (Signed)
 Pt offered breakfast but declined. Milk given.Edwin Henry

## 2023-09-19 ENCOUNTER — Ambulatory Visit (HOSPITAL_COMMUNITY)
Admission: EM | Admit: 2023-09-19 | Discharge: 2023-09-20 | Disposition: A | Discharge status: Home / Self Care | Payer: MEDICAID | Attending: Psychiatry | Admitting: Psychiatry

## 2023-09-19 DIAGNOSIS — Z79899 Other long term (current) drug therapy: Secondary | ICD-10-CM | POA: Insufficient documentation

## 2023-09-19 DIAGNOSIS — F259 Schizoaffective disorder, unspecified: Secondary | ICD-10-CM | POA: Insufficient documentation

## 2023-09-19 DIAGNOSIS — F17210 Nicotine dependence, cigarettes, uncomplicated: Secondary | ICD-10-CM | POA: Diagnosis not present

## 2023-09-19 DIAGNOSIS — R45851 Suicidal ideations: Secondary | ICD-10-CM

## 2023-09-19 LAB — CBC WITH DIFFERENTIAL/PLATELET
Abs Immature Granulocytes: 0.02 K/uL (ref 0.00–0.07)
Basophils Absolute: 0.1 K/uL (ref 0.0–0.1)
Basophils Relative: 1 %
Eosinophils Absolute: 0.2 K/uL (ref 0.0–0.5)
Eosinophils Relative: 3 %
HCT: 50 % (ref 39.0–52.0)
Hemoglobin: 16.8 g/dL (ref 13.0–17.0)
Immature Granulocytes: 0 %
Lymphocytes Relative: 43 %
Lymphs Abs: 3.2 K/uL (ref 0.7–4.0)
MCH: 30.5 pg (ref 26.0–34.0)
MCHC: 33.6 g/dL (ref 30.0–36.0)
MCV: 90.7 fL (ref 80.0–100.0)
Monocytes Absolute: 0.7 K/uL (ref 0.1–1.0)
Monocytes Relative: 9 %
Neutro Abs: 3.4 K/uL (ref 1.7–7.7)
Neutrophils Relative %: 44 %
Platelets: 275 K/uL (ref 150–400)
RBC: 5.51 MIL/uL (ref 4.22–5.81)
RDW: 14.2 % (ref 11.5–15.5)
WBC: 7.5 K/uL (ref 4.0–10.5)
nRBC: 0 % (ref 0.0–0.2)

## 2023-09-19 LAB — COMPREHENSIVE METABOLIC PANEL WITH GFR
ALT: 34 U/L (ref 0–44)
AST: 28 U/L (ref 15–41)
Albumin: 4.2 g/dL (ref 3.5–5.0)
Alkaline Phosphatase: 96 U/L (ref 38–126)
Anion gap: 11 (ref 5–15)
BUN: 8 mg/dL (ref 8–23)
CO2: 26 mmol/L (ref 22–32)
Calcium: 9.9 mg/dL (ref 8.9–10.3)
Chloride: 100 mmol/L (ref 98–111)
Creatinine, Ser: 0.92 mg/dL (ref 0.61–1.24)
GFR, Estimated: >60 mL/min (ref >60)
Glucose, Bld: 98 mg/dL (ref 70–99)
Potassium: 4.6 mmol/L (ref 3.5–5.1)
Sodium: 137 mmol/L (ref 135–145)
Total Bilirubin: 0.8 mg/dL (ref 0.0–1.2)
Total Protein: 7.8 g/dL (ref 6.5–8.1)

## 2023-09-19 LAB — ETHANOL: Alcohol, Ethyl (B): 111 mg/dL — ABNORMAL HIGH (ref <15)

## 2023-09-19 LAB — TSH: TSH: 2.116 u[IU]/mL (ref 0.350–4.500)

## 2023-09-19 MED ORDER — ALUM & MAG HYDROXIDE-SIMETH 200-200-20 MG/5ML PO SUSP: 30.0000 mL | ORAL | Status: DC | PRN

## 2023-09-19 MED ORDER — OLANZAPINE 10 MG IM SOLR: 10.0000 mg | Freq: Three times a day (TID) | INTRAMUSCULAR | Status: DC | PRN

## 2023-09-19 MED ORDER — ACETAMINOPHEN 325 MG PO TABS: 650.0000 mg | ORAL_TABLET | Freq: Four times a day (QID) | ORAL | Status: DC | PRN

## 2023-09-19 MED ORDER — HALOPERIDOL 5 MG PO TABS: 5.0000 mg | ORAL_TABLET | Freq: Three times a day (TID) | ORAL | Status: DC | PRN

## 2023-09-19 MED ORDER — MAGNESIUM HYDROXIDE 400 MG/5ML PO SUSP: 30.0000 mL | Freq: Every day | ORAL | Status: DC | PRN

## 2023-09-19 MED ORDER — DIPHENHYDRAMINE HCL 50 MG PO CAPS: 50.0000 mg | ORAL_CAPSULE | Freq: Three times a day (TID) | ORAL | Status: DC | PRN

## 2023-09-19 MED ORDER — OLANZAPINE 10 MG IM SOLR: 5.0000 mg | Freq: Three times a day (TID) | INTRAMUSCULAR | Status: DC | PRN

## 2023-09-19 MED ORDER — TRAZODONE HCL 50 MG PO TABS
50.0000 mg | ORAL_TABLET | Freq: Every evening | ORAL | Status: DC | PRN
  Administered 2023-09-19 (×2): 50 mg via ORAL
  Filled 2023-09-19: qty 1
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
## 2023-09-19 NOTE — ED Notes (Addendum)
 Pt A&o x 4, resting at present, presents with suicidal ideations, plan to walk into traffic. AVH noted also.  Comfort measures given.  Monitoring for safety.

## 2023-09-19 NOTE — ED Provider Notes (Signed)
 [facility] Continuous Assessment Admission H&P  Date: 09/19/23 Patient Name: Edwin Henry MRN: [id number] Chief Complaint: Having auditory hallucination  Diagnoses:  Final diagnoses:  Schizoaffective disorder, unspecified type (HCC)  Suicidal thoughts    HPI: Edwin Henry, 63 y/o male with a history of schizoaffective disorder, suicidal ideation.  Presented to [facility] via [employer].  Per the patient he has been having auditory hallucination where he hear voices telling him to hurt himself.  He also stated that he sees things crawling on the floor that are not there.  According to the patient he sees a psychiatrist at [facility] and does have an ACT team.  Per the patient he is compliant with his medication regiment.  Patient lives alone.  According [place]  Patient stated that he at times have suicidal thoughts but no plans, boarding to him a couple weeks ago the police took him out of the street because they found him standing in the middle of the road but he did not know how he got there.  Patient stated that he does not feel safe at home he feels safe if somebody is watching him.  That is why he came here.  Face-to-face evaluation of patient.  Patient is alert and oriented x 4, speech is clear, maintain eye contact.  Patient is a little disheveled.  However patient is pleasant.  Answer questions appropriately.  Patient when asked if he is suicidal stated he sometimes hear voices telling him to hurt himself but he is currently not suicidal.  Patient stating that he does see things crawling on the floor that are not there.  Patient denies access to guns at this time.  Patient reports he sometimes drinks alcohol probably 2 times a week.  Patient denies illicit drug use at this time.  Per the patient he does smoke cigarettes.  At this present moment patient does not appear to be a threat to himself or others.  Given patient presentation and prior  history, we will admit patient for observation.  Recommend observation units  Total Time spent with patient: 20 minutes  Musculoskeletal  Strength & Muscle Tone: within normal limits Gait & Station: normal Patient leans: N/A  Psychiatric Specialty Exam  Presentation General Appearance:  Disheveled  Eye Contact: Fair  Speech: Clear and Coherent  Speech Volume: Normal  Handedness: Right   Mood and Affect  Mood: Euthymic  Affect: Congruent   Thought Process  Thought Processes: Goal Directed  Descriptions of Associations:Intact  Orientation:Full (Time, Place and Person)  Thought Content:Logical  Diagnosis of Schizophrenia or Schizoaffective disorder in past: Yes  Duration of Psychotic Symptoms: No data recorded Hallucinations:Hallucinations: Auditory Description of Auditory Hallucinations: Voices telling him to hurt himself  Ideas of Reference:None  Suicidal Thoughts:Suicidal Thoughts: Yes, Passive SI Passive Intent and/or Plan: Without Intent; Without Plan  Homicidal Thoughts:Homicidal Thoughts: No   Sensorium  Memory: Immediate Fair  Judgment: Fair  Insight: Fair   Chartered certified accountant: Fair  Attention Span: Fair  Recall: Fair  Fund of Knowledge: Fair  Language: Fair   Psychomotor Activity  Psychomotor Activity: Psychomotor Activity: Normal   Assets  Assets: Desire for Improvement; Social Support   Sleep  Sleep: Sleep: Fair Number of Hours of Sleep: 6   Nutritional Assessment (For OBS and FBC admissions only) Has the patient had a weight loss or gain of 10 pounds or more in the last 3 months?: No Has the patient had  a decrease in food intake/or appetite?: No Does the patient have dental problems?: No Does the patient have eating habits or behaviors that may be indicators of an eating disorder including binging or inducing vomiting?: No Has the patient recently lost weight without trying?: 0 Has  the patient been eating poorly because of a decreased appetite?: 0 Malnutrition Screening Tool Score: 0    Physical Exam HENT:     Head: Normocephalic.     Nose: Nose normal.  Eyes:     Pupils: Pupils are equal, round, and reactive to light.  Cardiovascular:     Rate and Rhythm: Normal rate.  Pulmonary:     Effort: Pulmonary effort is normal.  Musculoskeletal:        General: Normal range of motion.     Cervical back: Normal range of motion.  Neurological:     General: No focal deficit present.     Mental Status: He is alert.  Psychiatric:        Mood and Affect: Mood normal.        Behavior: Behavior normal.        Thought Content: Thought content normal.        Judgment: Judgment normal.    Review of Systems  Constitutional: Negative.   HENT: Negative.    Eyes: Negative.   Respiratory: Negative.    Cardiovascular: Negative.   Gastrointestinal: Negative.   Genitourinary: Negative.   Musculoskeletal: Negative.   Skin: Negative.   Neurological: Negative.   Psychiatric/Behavioral:  Positive for hallucinations. The patient is nervous/anxious.     Blood pressure (!) 127/92, pulse (!) 104, temperature 98.6 F (37 C), temperature source Oral, resp. rate 16, SpO2 97%. There is no height or weight on file to calculate BMI.  Past Psychiatric History: Schizoaffective disorder, without ideation  Is the patient at risk to self? Yes  Has the patient been a risk to self in the past 6 months? Yes .    Has the patient been a risk to self within the distant past? Yes   Is the patient a risk to others? No   Has the patient been a risk to others in the past 6 months? No   Has the patient been a risk to others within the distant past? No   Past Medical History: See chart  Family History: Unknown  Social History: Tobacco use  Last Labs:  Admission on 07/25/2023, Discharged on 07/27/2023  Component Date Value Ref Range Status   WBC 07/25/2023 7.9  4.0 - 10.5 K/uL Final   RBC  07/25/2023 5.32  4.22 - 5.81 MIL/uL Final   Hemoglobin 07/25/2023 16.2  13.0 - 17.0 g/dL Final   HCT 93/81/7974 48.2  39.0 - 52.0 % Final   MCV 07/25/2023 90.6  80.0 - 100.0 fL Final   MCH 07/25/2023 30.5  26.0 - 34.0 pg Final   MCHC 07/25/2023 33.6  30.0 - 36.0 g/dL Final   RDW 93/81/7974 13.8  11.5 - 15.5 % Final   Platelets 07/25/2023 330  150 - 400 K/uL Final   nRBC 07/25/2023 0.0  0.0 - 0.2 % Final   Neutrophils Relative % 07/25/2023 54  % Final   Neutro Abs 07/25/2023 4.3  1.7 - 7.7 K/uL Final   Lymphocytes Relative 07/25/2023 33  % Final   Lymphs Abs 07/25/2023 2.6  0.7 - 4.0 K/uL Final   Monocytes Relative 07/25/2023 10  % Final   Monocytes Absolute 07/25/2023 0.8  0.1 - 1.0 K/uL Final  Eosinophils Relative 07/25/2023 2  % Final   Eosinophils Absolute 07/25/2023 0.1  0.0 - 0.5 K/uL Final   Basophils Relative 07/25/2023 1  % Final   Basophils Absolute 07/25/2023 0.1  0.0 - 0.1 K/uL Final   Immature Granulocytes 07/25/2023 0  % Final   Abs Immature Granulocytes 07/25/2023 0.03  0.00 - 0.07 K/uL Final   Performed at [facility], [address], [place], [place] [address]   Sodium 07/25/2023 138  135 - 145 mmol/L Final   Potassium 07/25/2023 4.3  3.5 - 5.1 mmol/L Final   Chloride 07/25/2023 102  98 - 111 mmol/L Final   CO2 07/25/2023 24  22 - 32 mmol/L Final   Glucose, Bld 07/25/2023 87  70 - 99 mg/dL Final   Glucose reference range applies only to samples taken after fasting for at least 8 hours.   BUN 07/25/2023 7 (L)  8 - 23 mg/dL Final   Creatinine, Ser 07/25/2023 1.04  0.61 - 1.24 mg/dL Final   Calcium 93/81/7974 9.8  8.9 - 10.3 mg/dL Final   Total Protein 93/81/7974 7.9  6.5 - 8.1 g/dL Final   Albumin 93/81/7974 4.3  3.5 - 5.0 g/dL Final   AST 93/81/7974 19  15 - 41 U/L Final   ALT 07/25/2023 21  0 - 44 U/L Final   Alkaline Phosphatase 07/25/2023 89  38 - 126 U/L Final   Total Bilirubin 07/25/2023 0.9  0.0 - 1.2 mg/dL Final   GFR, Estimated 07/25/2023 >60  >60  mL/min Final   Comment: (NOTE) Calculated using the CKD-EPI Creatinine Equation (2021)    Anion gap 07/25/2023 12  5 - 15 Final   Performed at [facility], [address] [place], [place] [address]   Alcohol, Ethyl (B) 07/25/2023 111 (H)  <15 mg/dL Final   Comment: (NOTE) For medical purposes only. Performed at [facility], [address] [place], [place] [address]    TSH 07/25/2023 1.933  0.350 - 4.500 uIU/mL Final   Comment: Performed by a 3rd Generation assay with a functional sensitivity of <=0.01 uIU/mL. Performed at [facility], [address] [place], [place] [address]    POC Amphetamine UR 07/26/2023 None Detected  NONE DETECTED (Cut Off Level 1000 ng/mL) Final   POC Secobarbital (BAR) 07/26/2023 None Detected  NONE DETECTED (Cut Off Level 300 ng/mL) Final   POC Buprenorphine (BUP) 07/26/2023 None Detected  NONE DETECTED (Cut Off Level 10 ng/mL) Final   POC Oxazepam (BZO) 07/26/2023 None Detected  NONE DETECTED (Cut Off Level 300 ng/mL) Final   POC Cocaine UR 07/26/2023 None Detected  NONE DETECTED (Cut Off Level 300 ng/mL) Final   POC Methamphetamine UR 07/26/2023 None Detected  NONE DETECTED (Cut Off Level 1000 ng/mL) Final   POC Morphine 07/26/2023 None Detected  NONE DETECTED (Cut Off Level 300 ng/mL) Final   POC Methadone UR 07/26/2023 None Detected  NONE DETECTED (Cut Off Level 300 ng/mL) Final   POC Oxycodone  UR 07/26/2023 None Detected  NONE DETECTED (Cut Off Level 100 ng/mL) Final   POC Marijuana UR 07/26/2023 None Detected  NONE DETECTED (Cut Off Level 50 ng/mL) Final    Allergies: Patient has no known allergies.  Medications:  PTA Medications  Medication Sig   mirtazapine  (REMERON ) 30 MG tablet Take 30 mg by mouth at bedtime.   benztropine  (COGENTIN ) 1 MG tablet Take 1 mg by mouth at bedtime.   gabapentin  (NEURONTIN ) 100 MG capsule Take  100 mg by mouth 2 (two) times daily.   QUEtiapine  (SEROQUEL ) 400 MG tablet Take 2 tablets (800 mg  total) by mouth at bedtime. (Patient taking differently: Take 400 mg by mouth at bedtime.)   feeding supplement (ENSURE ENLIVE / ENSURE PLUS) LIQD Take 237 mLs by mouth 3 (three) times daily between meals.   Melatonin 10 MG CAPS Take 10 mg by mouth at bedtime.   OLANZapine  (ZYPREXA ) 5 MG tablet Take 5 mg by mouth at bedtime.      Medical Decision Making  Observation unit    Recommendations  Based on my evaluation the patient does not appear to have an emergency medical condition.  Gaither Pouch, NP 09/19/23  8:05 PM

## 2023-09-19 NOTE — Progress Notes (Signed)
   09/19/23 1743  [facility] Triage Screening (Walk-ins at [facility] only)  How Did You Hear About Us ? Legal System  What Is the Reason for Your Visit/Call Today? PT Edwin Henry 62Y male presents to [facility] voluntarily by [facility]. PT is cooperative and polite. PT is diagnosed with schizophrenia and takes medication daily. PT stated that he had not taken any meds today. PT states that he hears voices that tell him to walk in the street to get hit by car. PT states that 3 weeks ago he was standing in traffic and the police had to be called; pt states he does not remember the occurence. PT denies HI, VH. PT endorses AH and SI.  How Long Has This Been Causing You Problems? 1 wk - 1 month  Have You Recently Had Any Thoughts About Hurting Yourself? Yes  How long ago did you have thoughts about hurting yourself? 3 weeks - today. PT heard voices telling him to walk into traffic  Are You Planning to Commit Suicide/Harm Yourself At This time? Yes  Have you Recently Had Thoughts About Hurting Someone [name]? No  Are You Planning To Harm Someone At This Time? No  Physical Abuse Denies  Verbal Abuse Denies  Sexual Abuse Denies  Exploitation of patient/patient's resources Denies  Self-Neglect Denies  Are you currently experiencing any auditory, visual or other hallucinations? Yes  Please explain the hallucinations you are currently experiencing: Auditory - voices telling him to walk into traffic  Have You Used Any Alcohol or Drugs in the Past 24 Hours? Yes  What Did You Use and How Much? Beer (a couple this morning)  Do you have any current medical co-morbidities that require immediate attention? No  Clinician description of patient physical appearance/behavior: Alcohol odor - appears under the influence, calm, cooperative  What Do You Feel Would Help You the Most Today? Treatment for Depression or other mood problem  Determination of Need Urgent (48 hours)  Options For Referral Facility-Based Crisis;BH Urgent  Care;Medication Management  Determination of Need filed? Yes

## 2023-09-20 LAB — POCT URINE DRUG SCREEN - MANUAL ENTRY (I-SCREEN)
POC Amphetamine UR: NOT DETECTED
POC Buprenorphine (BUP): NOT DETECTED
POC Cocaine UR: NOT DETECTED
POC Marijuana UR: NOT DETECTED
POC Methadone UR: NOT DETECTED
POC Methamphetamine UR: NOT DETECTED
POC Morphine: NOT DETECTED
POC Oxazepam (BZO): NOT DETECTED
POC Oxycodone UR: NOT DETECTED
POC Secobarbital (BAR): NOT DETECTED

## 2023-09-20 NOTE — ED Notes (Signed)
 Pt observed/assessed in recliner sleeping. RR even and unlabored, appearing in no noted distress. Environmental check complete, will continue to monitor for safety

## 2023-09-20 NOTE — Discharge Instructions (Addendum)
 Follow-up recommendations:  Activity:  Normal, as tolerated Diet:  Per PCP recommendation  Patient is instructed prior to discharge to:  Take all medications as prescribed by mental healthcare provider. Report any adverse effects and/or reactions from the medicines to outpatient provider promptly. To not engage in substance use while on psychiatric medicines.  In the event of worsening symptoms, patient is instructed to call the crisis hotline at 988, 911, or go to the nearest ED for appropriate evaluation and treatment of symptoms. To follow-up with primary care provider for your other medical issues, concerns and, or healthcare needs.
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
## 2023-09-20 NOTE — ED Notes (Signed)
 Stable. A&O x 4.  Discharging to home/self care.  Pt to f/u with ACT team this afternoon    Denies current SI plan and Intent.  Denies HI and A/V hallucinations.   All belongings returned to PT.  F/U instruction reviewed Pt verbalized understanding

## 2023-11-23 ENCOUNTER — Ambulatory Visit (HOSPITAL_COMMUNITY)
Admission: EM | Admit: 2023-11-23 | Discharge: 2023-11-23 | Disposition: A | Discharge status: Home / Self Care | Payer: MEDICAID | Attending: Nurse Practitioner | Admitting: Nurse Practitioner

## 2023-11-23 DIAGNOSIS — Z79899 Other long term (current) drug therapy: Secondary | ICD-10-CM | POA: Insufficient documentation

## 2023-11-23 DIAGNOSIS — G4709 Other insomnia: Secondary | ICD-10-CM | POA: Insufficient documentation

## 2023-11-23 DIAGNOSIS — F251 Schizoaffective disorder, depressive type: Secondary | ICD-10-CM | POA: Insufficient documentation

## 2023-11-23 NOTE — ED Provider Notes (Signed)
 Behavioral Health Urgent Care Medical Screening Exam  Patient Name: Demontez Novack MRN: [id number] Date of Evaluation: 11/23/23 Chief Complaint:  I ran out of my medications Diagnosis:  Final diagnoses:  Schizoaffective disorder, depressive type (HCC)  Other insomnia    History of Present illness: Liberty Stead is a 63 y.o. male patient with a psychiatric significant for schizoaffective disorder-depressive type, and insomnia presented to [facility] as a walk in voluntarily unaccompanied by [employer] with complaints of running out of his medications. Patient states that he ran out of his medications because he has been taking more than what is prescribed. Patient reports that he will fall asleep after taking his medications but will wake up a few hours later and then take more of his medications to go back to sleep. In doing so patient has ran out of his medications before it is time for him to receive his new prescriptions.   [name], 63 y.o., male patient seen face to face by this provider and chart reviewed on 11/23/23.  On evaluation Kosisochukwu Burningham reports that he is a patient at [facility] and is receiving [employer]. Patient is being followed by Dr. Janie and is prescribed seroquel  400mg -2 tabs at bedtime, gabapentin  300 mg 2 tabs at bedtime, lybalvi 5mg -10mg , 1 tab at bedtime, melatonin 5mg  at bedtime, and  cogentin  1 mg at bedtime. Patient reports that he knows that he is taking more medications than is prescribed but it is only the way he can get back to sleep.   During evaluation Kaydenn Mclear is sitting in the assessment room in no acute distress.He is alert, oriented x 4, calm, cooperative and attentive. His mood is euthymic with congruent affect. He has normal speech, and behavior. Objectively there is no evidence of psychosis/mania or delusional thinking.  Patient is able to converse coherently, goal directed thoughts, no distractibility, or pre-occupation. He currently denies  suicidal/self-harm/homicidal ideation, psychosis, and paranoia.  Patient answered question appropriately.      Patient can be discharged with resources and follow up care in outpatient services at [facility] for ongoing Medication Management and Individual Therapy.  Flowsheet Row ED from 11/23/2023 in [facility] ED from 09/19/2023 in [facility] ED from 07/25/2023 in [facility]  C-SSRS RISK CATEGORY Moderate Risk Moderate Risk Low Risk    Psychiatric Specialty Exam  Presentation  General Appearance:Casual  Eye Contact:Good  Speech:Clear and Coherent  Speech Volume:Normal  Handedness:Right   Mood and Affect  Mood: Euthymic  Affect: Congruent   Thought Process  Thought Processes: Coherent  Descriptions of Associations:Intact  Orientation:Full (Time, Place and Person)  Thought Content:WDL  Diagnosis of Schizophrenia or Schizoaffective disorder in past: No  Duration of Psychotic Symptoms: Greater than six months  Hallucinations:Auditory heard voices last night  Ideas of Reference:None  Suicidal Thoughts:No Without Intent; Without Plan  Homicidal Thoughts:No   Sensorium  Memory: Immediate Fair; Recent Fair; Remote Fair  Judgment: Fair  Insight: Fair   Chartered certified accountant: Fair  Attention Span: Fair  Recall: [employer] of Knowledge: Fair  Language: Fair   Psychomotor Activity  Psychomotor Activity: Normal   Assets  Assets: Manufacturing systems engineer; Desire for Improvement; Physical Health; Resilience   Sleep  Sleep: Fair  Number of hours:  6   Physical Exam: Physical Exam HENT:     Head: Normocephalic.     Nose: Nose normal.  Eyes:     Pupils: Pupils are equal,  round, and reactive to light.  Cardiovascular:     Rate and Rhythm: Normal rate.  Pulmonary:     Effort: Pulmonary effort is normal.  Abdominal:     General:  Abdomen is flat.  Musculoskeletal:        General: Normal range of motion.     Cervical back: Normal range of motion.  Skin:    General: Skin is warm.  Neurological:     Mental Status: He is alert and oriented to person, place, and time.  Psychiatric:        Attention and Perception: Attention normal.        Mood and Affect: Affect is flat.        Speech: Speech normal.        Behavior: Behavior is cooperative.        Thought Content: Thought content is not paranoid or delusional. Thought content does not include homicidal or suicidal ideation. Thought content does not include homicidal or suicidal plan.        Cognition and Memory: Cognition normal.        Judgment: Judgment is impulsive.    Review of Systems  Constitutional: Negative.   HENT: Negative.    Eyes: Negative.   Respiratory: Negative.    Cardiovascular: Negative.   Gastrointestinal: Negative.   Genitourinary: Negative.   Musculoskeletal: Negative.   Skin: Negative.   Neurological: Negative.   Psychiatric/Behavioral:  Positive for hallucinations.    Blood pressure (!) 139/94, pulse 90, temperature 98.3 F (36.8 C), temperature source Oral, resp. rate 18, SpO2 98%. There is no height or weight on file to calculate BMI.  Musculoskeletal: Strength & Muscle Tone: within normal limits Gait & Station: normal Patient leans: N/A   [facility] MSE Discharge Disposition for Follow up and Recommendations: Based on my evaluation the patient does not appear to have an emergency medical condition and can be discharged with resources and follow up care in outpatient services for Medication Management and Individual Therapy   Roxianne FORBES Olp, NP 11/23/2023, 8:38 PM

## 2023-11-23 NOTE — ED Notes (Signed)
 [employer] called for transport

## 2023-11-23 NOTE — Discharge Instructions (Signed)
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
